# Patient Record
Sex: Male | Born: 1935 | Race: Black or African American | Hispanic: No | Marital: Single | Smoking: Former smoker
Health system: Southern US, Community
[De-identification: ages and names within clinical notes are randomized; demographics above are authoritative.]

## PROBLEM LIST (undated history)

## (undated) DIAGNOSIS — M1990 Unspecified osteoarthritis, unspecified site: Secondary | ICD-10-CM

## (undated) DIAGNOSIS — I10 Essential (primary) hypertension: Secondary | ICD-10-CM

## (undated) DIAGNOSIS — F0390 Unspecified dementia without behavioral disturbance: Secondary | ICD-10-CM

## (undated) DIAGNOSIS — C189 Malignant neoplasm of colon, unspecified: Secondary | ICD-10-CM

## (undated) DIAGNOSIS — R569 Unspecified convulsions: Secondary | ICD-10-CM

## (undated) DIAGNOSIS — D649 Anemia, unspecified: Secondary | ICD-10-CM

## (undated) DIAGNOSIS — F1020 Alcohol dependence, uncomplicated: Secondary | ICD-10-CM

## (undated) HISTORY — DX: Unspecified convulsions: R56.9

## (undated) HISTORY — DX: Unspecified osteoarthritis, unspecified site: M19.90

## (undated) HISTORY — DX: Alcohol dependence, uncomplicated: F10.20

## (undated) HISTORY — PX: COLONOSCOPY: SHX174

## (undated) HISTORY — PX: ORIF FEMORAL NECK FRACTURE W/ DHS: SUR930

## (undated) HISTORY — DX: Anemia, unspecified: D64.9

## (undated) HISTORY — DX: Essential (primary) hypertension: I10

## (undated) HISTORY — DX: Unspecified dementia, unspecified severity, without behavioral disturbance, psychotic disturbance, mood disturbance, and anxiety: F03.90

---

## 1998-07-06 ENCOUNTER — Emergency Department (HOSPITAL_COMMUNITY): Admission: EM | Admit: 1998-07-06 | Discharge: 1998-07-06 | Payer: Self-pay | Admitting: Emergency Medicine

## 1998-07-06 ENCOUNTER — Encounter: Payer: Self-pay | Admitting: Emergency Medicine

## 1999-09-11 ENCOUNTER — Emergency Department (HOSPITAL_COMMUNITY): Admission: EM | Admit: 1999-09-11 | Discharge: 1999-09-11 | Payer: Self-pay | Admitting: Emergency Medicine

## 1999-09-11 ENCOUNTER — Encounter: Payer: Self-pay | Admitting: Emergency Medicine

## 2000-09-17 ENCOUNTER — Emergency Department (HOSPITAL_COMMUNITY): Admission: EM | Admit: 2000-09-17 | Discharge: 2000-09-18 | Payer: Self-pay | Admitting: Emergency Medicine

## 2000-12-15 ENCOUNTER — Encounter: Payer: Self-pay | Admitting: Emergency Medicine

## 2000-12-15 ENCOUNTER — Emergency Department (HOSPITAL_COMMUNITY): Admission: EM | Admit: 2000-12-15 | Discharge: 2000-12-16 | Payer: Self-pay

## 2001-08-01 ENCOUNTER — Emergency Department (HOSPITAL_COMMUNITY): Admission: EM | Admit: 2001-08-01 | Discharge: 2001-08-01 | Payer: Self-pay | Admitting: Emergency Medicine

## 2001-08-01 ENCOUNTER — Encounter: Payer: Self-pay | Admitting: Emergency Medicine

## 2002-03-14 ENCOUNTER — Emergency Department (HOSPITAL_COMMUNITY): Admission: EM | Admit: 2002-03-14 | Discharge: 2002-03-14 | Payer: Self-pay | Admitting: *Deleted

## 2002-03-14 ENCOUNTER — Encounter: Payer: Self-pay | Admitting: *Deleted

## 2002-03-29 ENCOUNTER — Ambulatory Visit (HOSPITAL_COMMUNITY): Admission: RE | Admit: 2002-03-29 | Discharge: 2002-03-29 | Payer: Self-pay | Admitting: Internal Medicine

## 2002-03-29 ENCOUNTER — Encounter: Payer: Self-pay | Admitting: Internal Medicine

## 2002-04-09 ENCOUNTER — Encounter: Admission: RE | Admit: 2002-04-09 | Discharge: 2002-04-09 | Payer: Self-pay | Admitting: Internal Medicine

## 2002-04-09 ENCOUNTER — Encounter: Payer: Self-pay | Admitting: Internal Medicine

## 2002-04-12 ENCOUNTER — Ambulatory Visit (HOSPITAL_COMMUNITY): Admission: RE | Admit: 2002-04-12 | Discharge: 2002-04-12 | Payer: Self-pay | Admitting: Internal Medicine

## 2002-04-12 ENCOUNTER — Encounter: Payer: Self-pay | Admitting: Internal Medicine

## 2003-11-06 ENCOUNTER — Emergency Department (HOSPITAL_COMMUNITY): Admission: EM | Admit: 2003-11-06 | Discharge: 2003-11-06 | Payer: Self-pay | Admitting: Emergency Medicine

## 2004-10-20 ENCOUNTER — Inpatient Hospital Stay (HOSPITAL_COMMUNITY): Admission: EM | Admit: 2004-10-20 | Discharge: 2004-10-24 | Payer: Self-pay | Admitting: Emergency Medicine

## 2004-11-02 ENCOUNTER — Ambulatory Visit: Payer: Self-pay | Admitting: Nurse Practitioner

## 2004-11-06 ENCOUNTER — Emergency Department (HOSPITAL_COMMUNITY): Admission: EM | Admit: 2004-11-06 | Discharge: 2004-11-06 | Payer: Self-pay | Admitting: Emergency Medicine

## 2004-12-22 ENCOUNTER — Ambulatory Visit (HOSPITAL_COMMUNITY): Admission: RE | Admit: 2004-12-22 | Discharge: 2004-12-22 | Payer: Self-pay | Admitting: Internal Medicine

## 2005-04-03 ENCOUNTER — Emergency Department (HOSPITAL_COMMUNITY): Admission: EM | Admit: 2005-04-03 | Discharge: 2005-04-03 | Payer: Self-pay | Admitting: Emergency Medicine

## 2005-05-20 ENCOUNTER — Emergency Department (HOSPITAL_COMMUNITY): Admission: EM | Admit: 2005-05-20 | Discharge: 2005-05-20 | Payer: Self-pay | Admitting: Emergency Medicine

## 2005-08-16 ENCOUNTER — Emergency Department (HOSPITAL_COMMUNITY): Admission: EM | Admit: 2005-08-16 | Discharge: 2005-08-16 | Payer: Self-pay | Admitting: Emergency Medicine

## 2005-08-22 ENCOUNTER — Emergency Department (HOSPITAL_COMMUNITY): Admission: EM | Admit: 2005-08-22 | Discharge: 2005-08-22 | Payer: Self-pay | Admitting: Emergency Medicine

## 2005-09-14 ENCOUNTER — Emergency Department (HOSPITAL_COMMUNITY): Admission: EM | Admit: 2005-09-14 | Discharge: 2005-09-15 | Payer: Self-pay | Admitting: Emergency Medicine

## 2006-07-30 IMAGING — CR DG HIP (WITH OR WITHOUT PELVIS) 2-3V*L*
3 series · 3 of 3 positions shown · non-contrast
Comparison: none

CLINICAL DATA: Left hip pain, no injury

LEFT HIP - 2 VIEW:

[t pelvis a.p.]
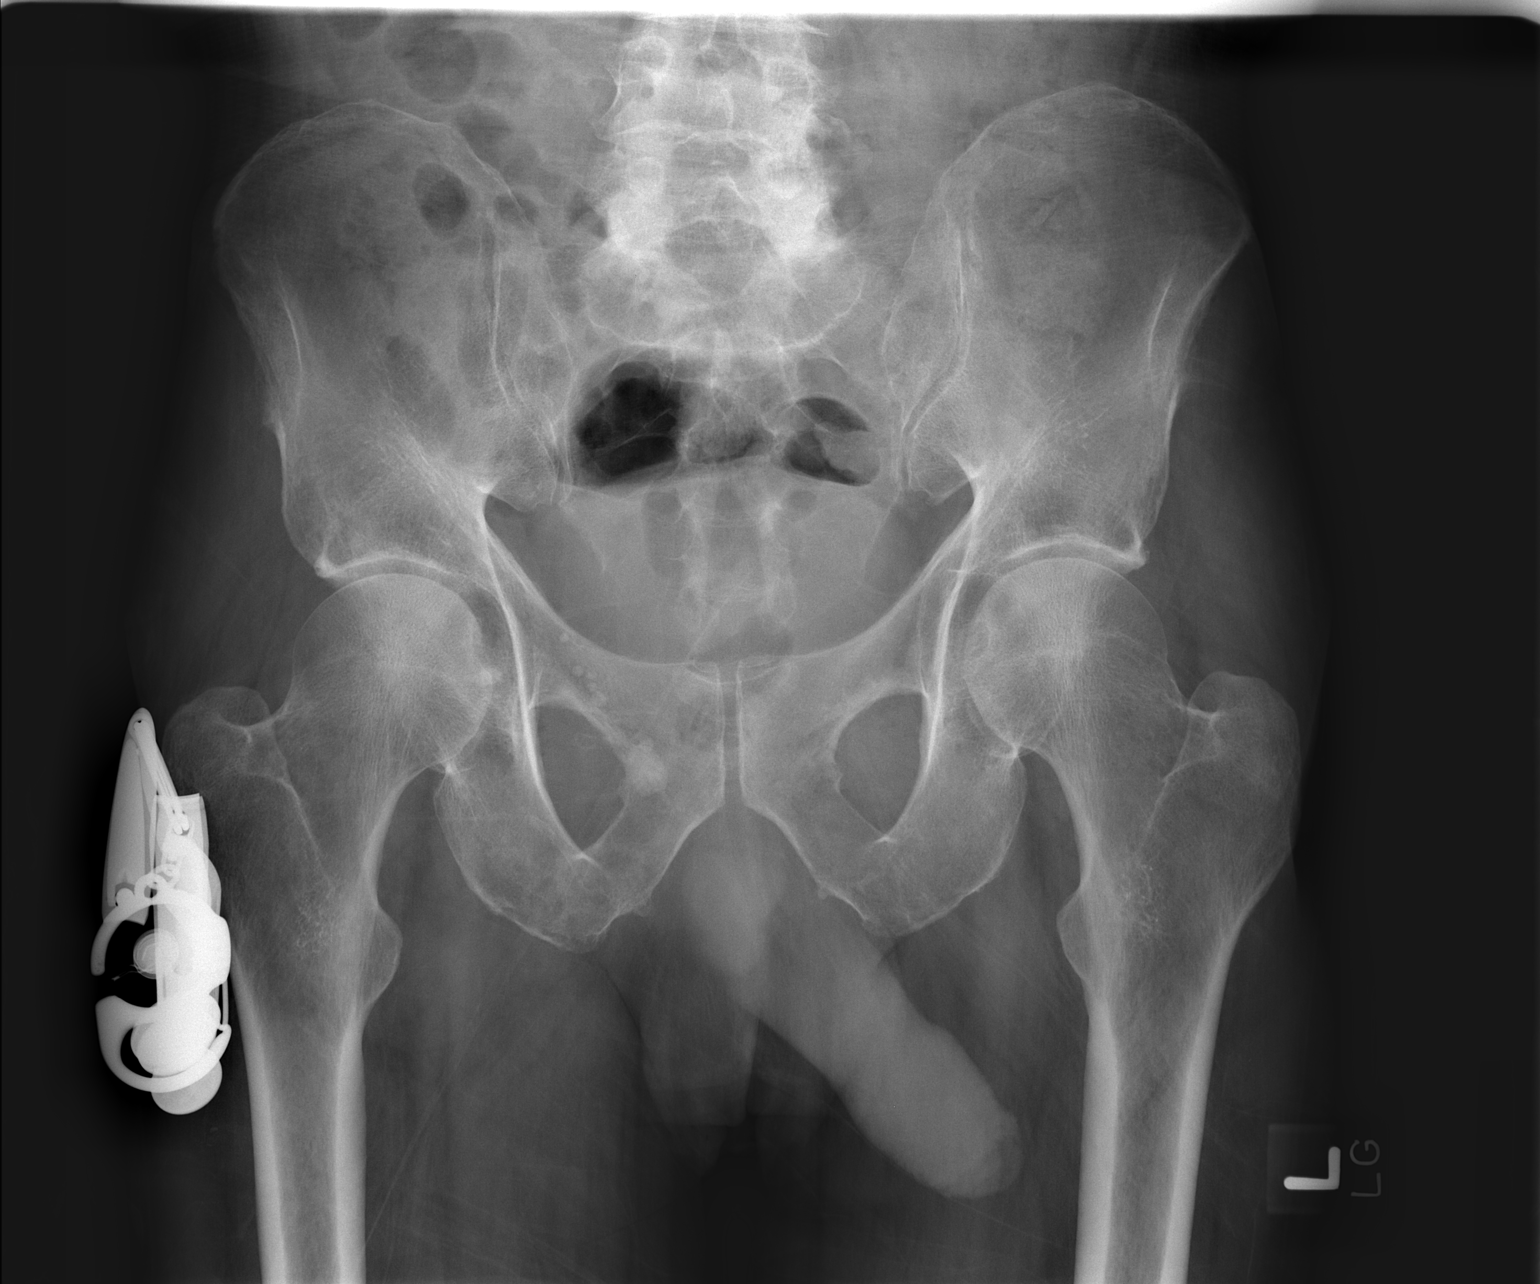

[t hip ap left]
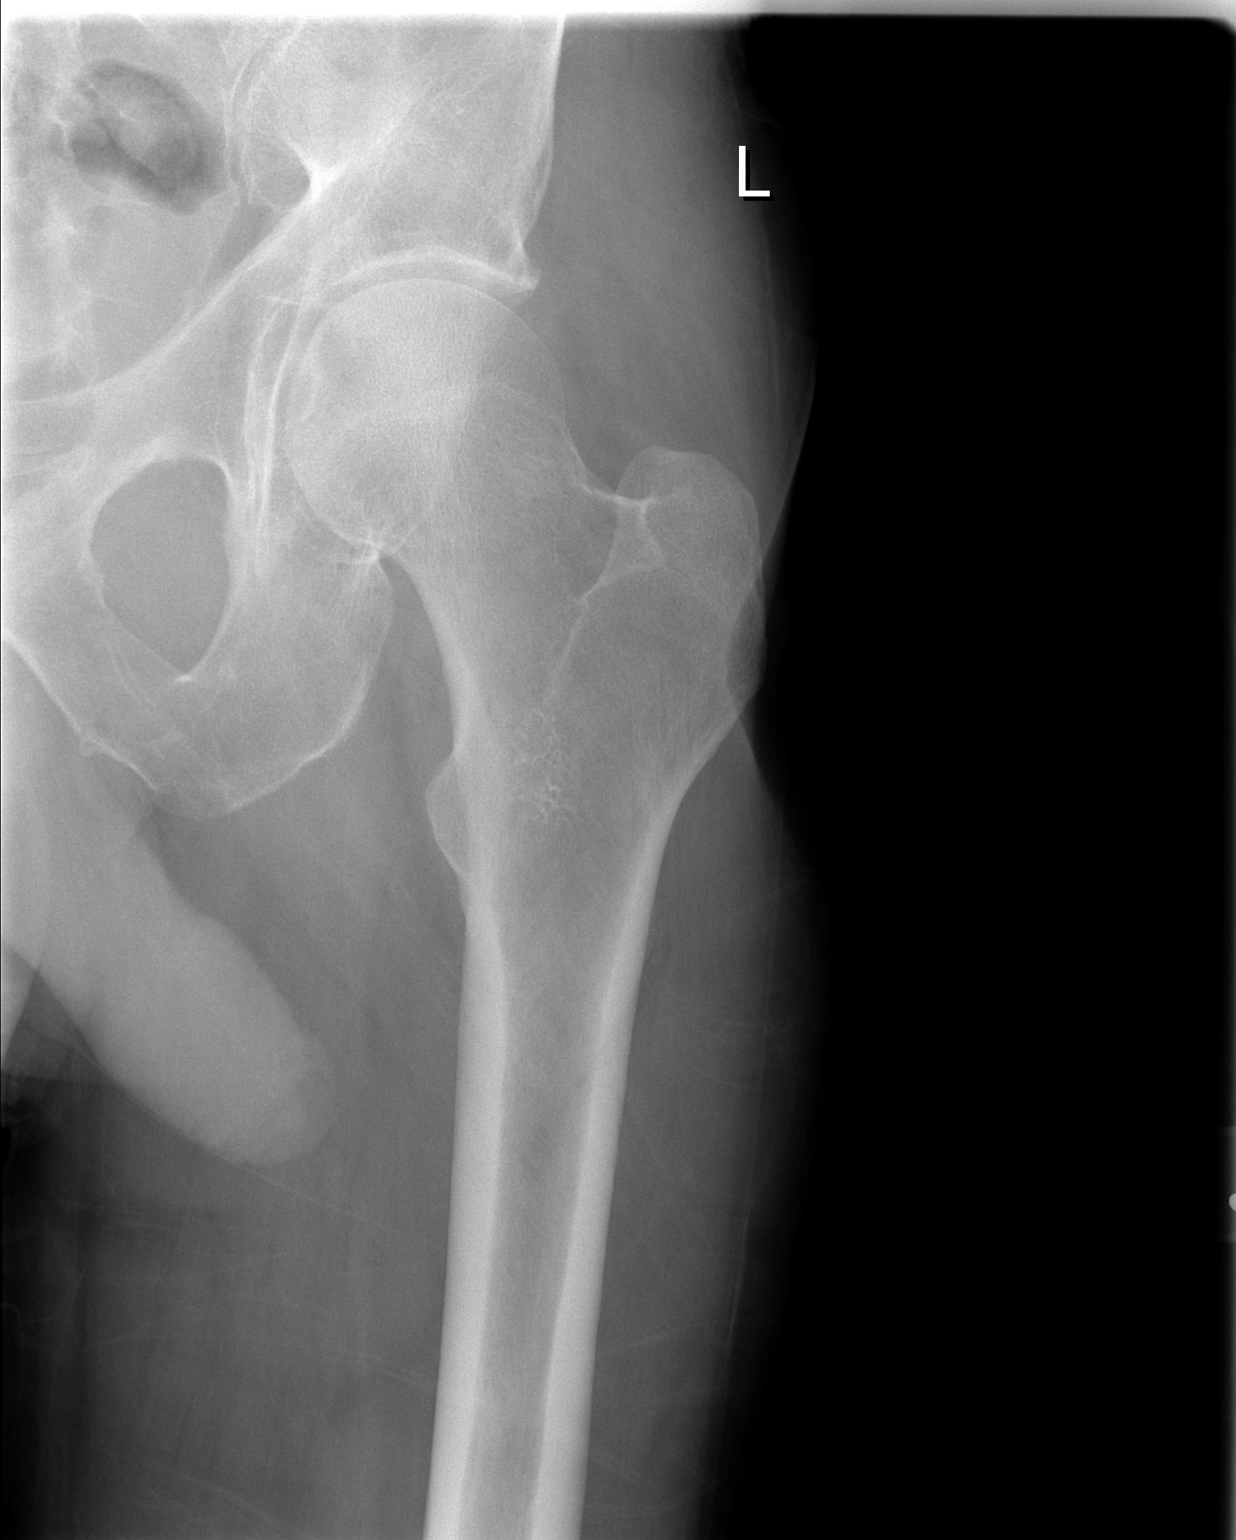

[t hip frog leg left]
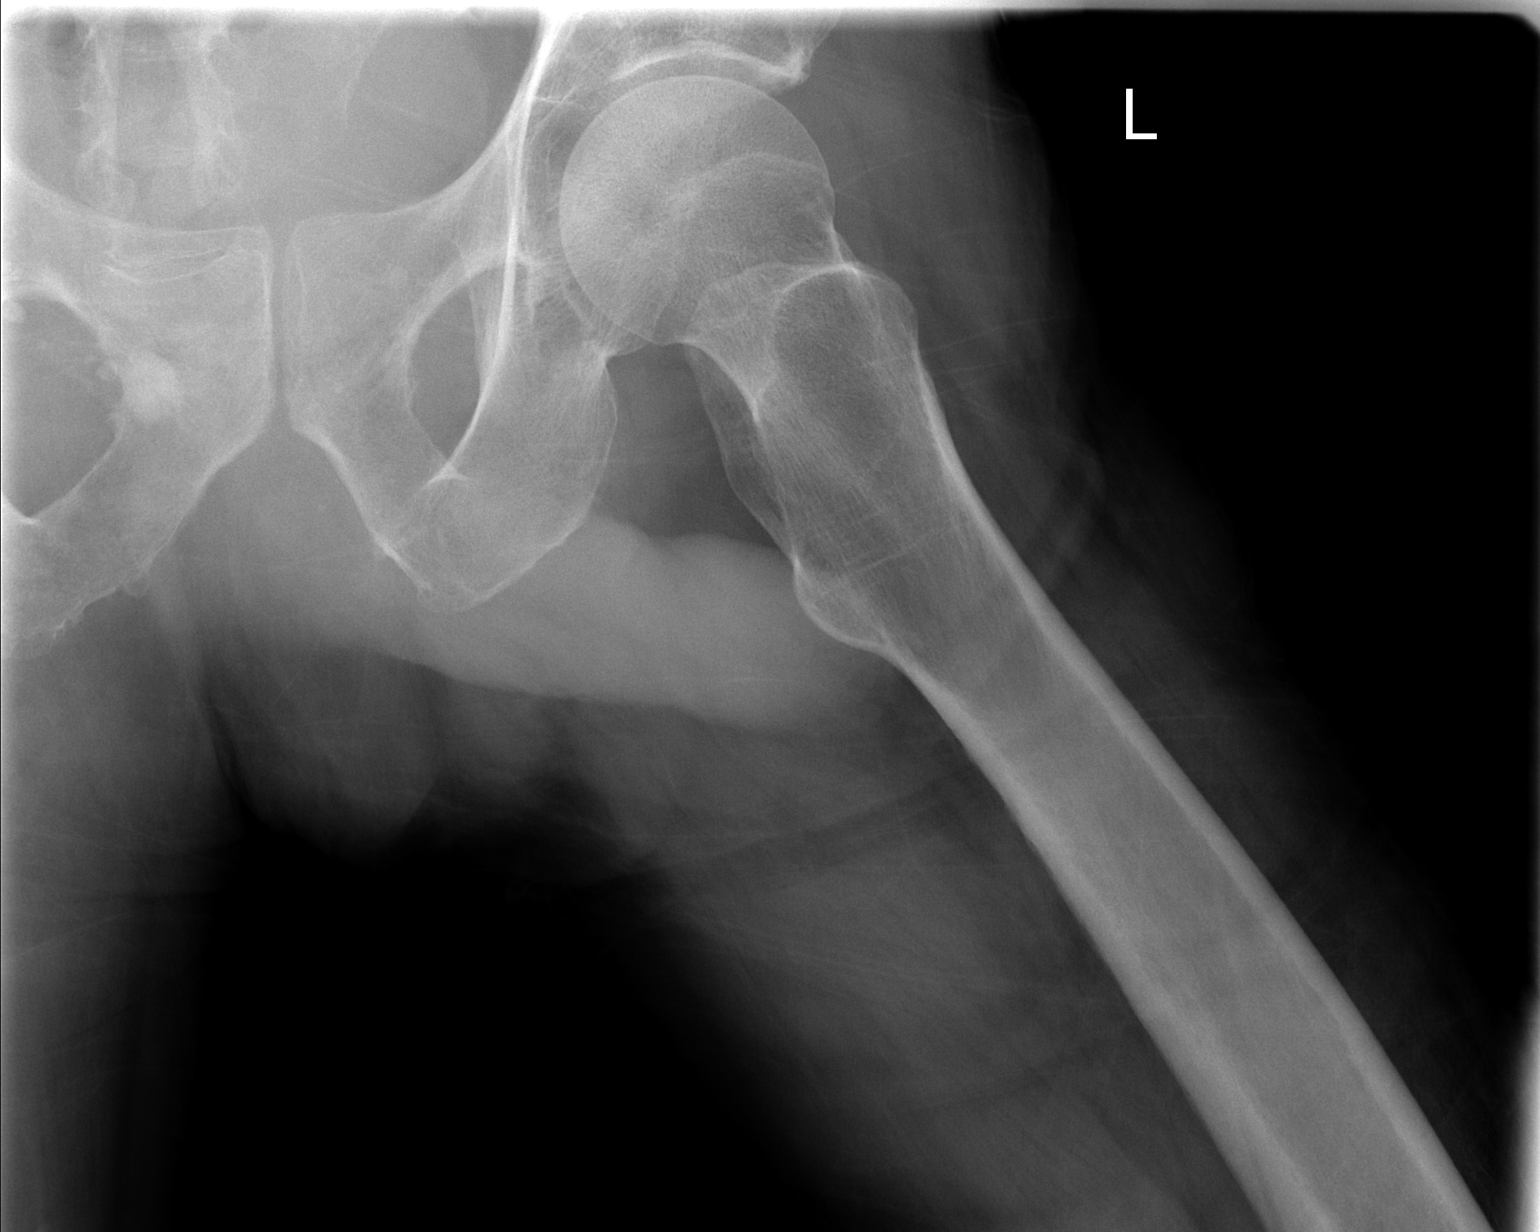

[3 of 3 positions shown; findings below may reference images not displayed]

FINDINGS: There are mild degenerative changes in the hips bilaterally. No
evidence of fracture, subluxation, or dislocation. Again noted is a sclerotic
focus within the right pubic bone, stable since prior study, likely bone island.
IMPRESSION: Mild degenerative changes. No acute findings.

## 2006-10-13 ENCOUNTER — Emergency Department (HOSPITAL_COMMUNITY): Admission: EM | Admit: 2006-10-13 | Discharge: 2006-10-13 | Payer: Self-pay | Admitting: Emergency Medicine

## 2007-04-06 ENCOUNTER — Emergency Department (HOSPITAL_COMMUNITY): Admission: EM | Admit: 2007-04-06 | Discharge: 2007-04-07 | Payer: Self-pay | Admitting: Emergency Medicine

## 2007-08-19 ENCOUNTER — Inpatient Hospital Stay (HOSPITAL_COMMUNITY): Admission: EM | Admit: 2007-08-19 | Discharge: 2007-08-23 | Payer: Self-pay | Admitting: Emergency Medicine

## 2007-08-20 ENCOUNTER — Encounter (INDEPENDENT_AMBULATORY_CARE_PROVIDER_SITE_OTHER): Payer: Self-pay | Admitting: Orthopedic Surgery

## 2007-09-05 ENCOUNTER — Ambulatory Visit (HOSPITAL_COMMUNITY): Admission: RE | Admit: 2007-09-05 | Discharge: 2007-09-05 | Payer: Self-pay | Admitting: Orthopedic Surgery

## 2007-09-05 ENCOUNTER — Ambulatory Visit: Payer: Self-pay | Admitting: Vascular Surgery

## 2007-09-05 ENCOUNTER — Encounter (INDEPENDENT_AMBULATORY_CARE_PROVIDER_SITE_OTHER): Payer: Self-pay | Admitting: Orthopedic Surgery

## 2008-07-23 ENCOUNTER — Inpatient Hospital Stay (HOSPITAL_COMMUNITY): Admission: EM | Admit: 2008-07-23 | Discharge: 2008-07-29 | Payer: Self-pay | Admitting: Emergency Medicine

## 2008-10-06 ENCOUNTER — Inpatient Hospital Stay (HOSPITAL_COMMUNITY): Admission: EM | Admit: 2008-10-06 | Discharge: 2008-10-13 | Payer: Self-pay | Admitting: Emergency Medicine

## 2008-12-25 ENCOUNTER — Emergency Department (HOSPITAL_COMMUNITY): Admission: EM | Admit: 2008-12-25 | Discharge: 2008-12-25 | Payer: Self-pay | Admitting: Emergency Medicine

## 2009-02-25 ENCOUNTER — Ambulatory Visit: Payer: Self-pay | Admitting: Internal Medicine

## 2009-02-25 ENCOUNTER — Inpatient Hospital Stay (HOSPITAL_COMMUNITY): Admission: EM | Admit: 2009-02-25 | Discharge: 2009-03-04 | Payer: Self-pay | Admitting: Emergency Medicine

## 2009-09-16 ENCOUNTER — Inpatient Hospital Stay (HOSPITAL_COMMUNITY): Admission: EM | Admit: 2009-09-16 | Discharge: 2009-09-22 | Payer: Self-pay | Admitting: Emergency Medicine

## 2010-05-05 ENCOUNTER — Ambulatory Visit: Payer: Self-pay | Admitting: Gastroenterology

## 2010-07-24 ENCOUNTER — Inpatient Hospital Stay (INDEPENDENT_AMBULATORY_CARE_PROVIDER_SITE_OTHER)
Admission: RE | Admit: 2010-07-24 | Discharge: 2010-07-24 | Disposition: A | Discharge status: Home / Self Care | Payer: Medicaid Other | Source: Ambulatory Visit | Attending: Emergency Medicine | Admitting: Emergency Medicine

## 2010-07-24 DIAGNOSIS — I10 Essential (primary) hypertension: Secondary | ICD-10-CM

## 2010-07-24 DIAGNOSIS — N289 Disorder of kidney and ureter, unspecified: Secondary | ICD-10-CM

## 2010-07-24 DIAGNOSIS — R630 Anorexia: Secondary | ICD-10-CM

## 2010-07-24 DIAGNOSIS — F0390 Unspecified dementia without behavioral disturbance: Secondary | ICD-10-CM

## 2010-07-24 LAB — POCT URINALYSIS DIPSTICK
Glucose, UA: NEGATIVE mg/dL
Protein, ur: 30 mg/dL — AB
Specific Gravity, Urine: 1.01 (ref 1.005–1.030)
Urobilinogen, UA: 4 mg/dL — ABNORMAL HIGH (ref 0.0–1.0)
pH: 5.5 (ref 5.0–8.0)

## 2010-07-24 LAB — POCT I-STAT, CHEM 8
BUN: 42 mg/dL — ABNORMAL HIGH (ref 6–23)
Calcium, Ion: 1.05 mmol/L — ABNORMAL LOW (ref 1.12–1.32)
Chloride: 102 mEq/L (ref 96–112)
Creatinine, Ser: 2.1 mg/dL — ABNORMAL HIGH (ref 0.4–1.5)
TCO2: 21 mmol/L (ref 0–100)

## 2010-08-02 LAB — BASIC METABOLIC PANEL
BUN: 12 mg/dL (ref 6–23)
BUN: 17 mg/dL (ref 6–23)
CO2: 22 mEq/L (ref 19–32)
CO2: 25 mEq/L (ref 19–32)
CO2: 27 mEq/L (ref 19–32)
Calcium: 8.5 mg/dL (ref 8.4–10.5)
Calcium: 8.6 mg/dL (ref 8.4–10.5)
Calcium: 8.7 mg/dL (ref 8.4–10.5)
Chloride: 105 mEq/L (ref 96–112)
Chloride: 108 mEq/L (ref 96–112)
Chloride: 111 mEq/L (ref 96–112)
Creatinine, Ser: 0.97 mg/dL (ref 0.4–1.5)
Creatinine, Ser: 1.03 mg/dL (ref 0.4–1.5)
Creatinine, Ser: 1.13 mg/dL (ref 0.4–1.5)
Creatinine, Ser: 1.17 mg/dL (ref 0.4–1.5)
GFR calc Af Amer: >60 mL/min (ref >60)
GFR calc Af Amer: >60 mL/min (ref >60)
GFR calc Af Amer: >60 mL/min (ref >60)
GFR calc non Af Amer: >60 mL/min (ref >60)
GFR calc non Af Amer: >60 mL/min (ref >60)
GFR calc non Af Amer: >60 mL/min (ref >60)
Glucose, Bld: 105 mg/dL — ABNORMAL HIGH (ref 70–99)
Glucose, Bld: 110 mg/dL — ABNORMAL HIGH (ref 70–99)
Potassium: 4.2 mEq/L (ref 3.5–5.1)
Sodium: 136 mEq/L (ref 135–145)
Sodium: 138 mEq/L (ref 135–145)
Sodium: 141 mEq/L (ref 135–145)

## 2010-08-02 LAB — CBC
HCT: 31 % — ABNORMAL LOW (ref 39.0–52.0)
HCT: 33 % — ABNORMAL LOW (ref 39.0–52.0)
HCT: 40.7 % (ref 39.0–52.0)
Hemoglobin: 10.8 g/dL — ABNORMAL LOW (ref 13.0–17.0)
Hemoglobin: 13.3 g/dL (ref 13.0–17.0)
MCHC: 32.4 g/dL (ref 30.0–36.0)
MCHC: 32.8 g/dL (ref 30.0–36.0)
MCHC: 33.5 g/dL (ref 30.0–36.0)
MCV: 94.1 fL (ref 78.0–100.0)
MCV: 94.3 fL (ref 78.0–100.0)
MCV: 95 fL (ref 78.0–100.0)
Platelets: 106 10*3/uL — ABNORMAL LOW (ref 150–400)
Platelets: 114 10*3/uL — ABNORMAL LOW (ref 150–400)
Platelets: 115 10*3/uL — ABNORMAL LOW (ref 150–400)
Platelets: 191 10*3/uL (ref 150–400)
RBC: 3.27 MIL/uL — ABNORMAL LOW (ref 4.22–5.81)
RBC: 3.56 MIL/uL — ABNORMAL LOW (ref 4.22–5.81)
RDW: 12.6 % (ref 11.5–15.5)
RDW: 12.7 % (ref 11.5–15.5)
WBC: 9.4 10*3/uL (ref 4.0–10.5)
WBC: 9.8 10*3/uL (ref 4.0–10.5)
WBC: 9.9 10*3/uL (ref 4.0–10.5)

## 2010-08-02 LAB — URINALYSIS, ROUTINE W REFLEX MICROSCOPIC
Bilirubin Urine: NEGATIVE
Ketones, ur: 15 mg/dL — AB
Nitrite: NEGATIVE
Protein, ur: 30 mg/dL — AB
Specific Gravity, Urine: 1.019 (ref 1.005–1.030)
Urobilinogen, UA: 0.2 mg/dL (ref 0.0–1.0)

## 2010-08-02 LAB — COMPREHENSIVE METABOLIC PANEL
ALT: 127 U/L — ABNORMAL HIGH (ref 0–53)
Alkaline Phosphatase: 115 U/L (ref 39–117)
BUN: 29 mg/dL — ABNORMAL HIGH (ref 6–23)
CO2: 22 mEq/L (ref 19–32)
GFR calc non Af Amer: 41 mL/min — ABNORMAL LOW (ref >60)
Glucose, Bld: 114 mg/dL — ABNORMAL HIGH (ref 70–99)
Potassium: 5 mEq/L (ref 3.5–5.1)
Total Protein: 8.1 g/dL (ref 6.0–8.3)

## 2010-08-02 LAB — MAGNESIUM
Magnesium: 2.1 mg/dL (ref 1.5–2.5)
Magnesium: 2.6 mg/dL — ABNORMAL HIGH (ref 1.5–2.5)

## 2010-08-02 LAB — RAPID URINE DRUG SCREEN, HOSP PERFORMED
Amphetamines: NOT DETECTED
Benzodiazepines: NOT DETECTED
Cocaine: NOT DETECTED
Opiates: NOT DETECTED
Tetrahydrocannabinol: NOT DETECTED

## 2010-08-02 LAB — PHENYTOIN LEVEL, TOTAL
Phenytoin Lvl: 13.9 ug/mL (ref 10.0–20.0)
Phenytoin Lvl: 6.1 ug/mL — ABNORMAL LOW (ref 10.0–20.0)

## 2010-08-02 LAB — DIFFERENTIAL
Basophils Absolute: 0 10*3/uL (ref 0.0–0.1)
Basophils Relative: 1 % (ref 0–1)
Eosinophils Absolute: 0.1 10*3/uL (ref 0.0–0.7)
Monocytes Relative: 5 % (ref 3–12)
Neutro Abs: 5.1 10*3/uL (ref 1.7–7.7)
Neutrophils Relative %: 52 % (ref 43–77)

## 2010-08-02 LAB — PHOSPHORUS: Phosphorus: 3.2 mg/dL (ref 2.3–4.6)

## 2010-08-02 LAB — T4, FREE: Free T4: 1.01 ng/dL (ref 0.80–1.80)

## 2010-08-02 LAB — FLUORESCENT TREPONEMAL AB(FTA)-IGG-BLD

## 2010-08-02 LAB — TSH: TSH: 0.745 u[IU]/mL (ref 0.350–4.500)

## 2010-08-02 LAB — AMMONIA: Ammonia: 34 umol/L (ref 11–35)

## 2010-08-18 LAB — URINALYSIS, ROUTINE W REFLEX MICROSCOPIC
Glucose, UA: NEGATIVE mg/dL
Ketones, ur: NEGATIVE mg/dL
Leukocytes, UA: NEGATIVE
Nitrite: NEGATIVE
Specific Gravity, Urine: 1.012 (ref 1.005–1.030)
Specific Gravity, Urine: 1.016 (ref 1.005–1.030)
Urobilinogen, UA: 0.2 mg/dL (ref 0.0–1.0)
pH: 6 (ref 5.0–8.0)

## 2010-08-18 LAB — GLUCOSE, CAPILLARY
Glucose-Capillary: 90 mg/dL (ref 70–99)
Glucose-Capillary: 91 mg/dL (ref 70–99)
Glucose-Capillary: 119 mg/dL — ABNORMAL HIGH (ref 70–99)
Glucose-Capillary: 127 mg/dL — ABNORMAL HIGH (ref 70–99)
Glucose-Capillary: 134 mg/dL — ABNORMAL HIGH (ref 70–99)
Glucose-Capillary: 148 mg/dL — ABNORMAL HIGH (ref 70–99)
Glucose-Capillary: 153 mg/dL — ABNORMAL HIGH (ref 70–99)
Glucose-Capillary: 87 mg/dL (ref 70–99)

## 2010-08-18 LAB — CBC
HCT: 29.8 % — ABNORMAL LOW (ref 39.0–52.0)
HCT: 30.8 % — ABNORMAL LOW (ref 39.0–52.0)
HCT: 38 % — ABNORMAL LOW (ref 39.0–52.0)
Hemoglobin: 10.6 g/dL — ABNORMAL LOW (ref 13.0–17.0)
Hemoglobin: 10.8 g/dL — ABNORMAL LOW (ref 13.0–17.0)
MCHC: 33 g/dL (ref 30.0–36.0)
MCV: 93.7 fL (ref 78.0–100.0)
MCV: 92.9 fL (ref 78.0–100.0)
Platelets: 142 10*3/uL — ABNORMAL LOW (ref 150–400)
Platelets: 168 10*3/uL (ref 150–400)
Platelets: 185 10*3/uL (ref 150–400)
RBC: 3.49 MIL/uL — ABNORMAL LOW (ref 4.22–5.81)
RBC: 3.55 MIL/uL — ABNORMAL LOW (ref 4.22–5.81)
RDW: 12.5 % (ref 11.5–15.5)
RDW: 12.5 % (ref 11.5–15.5)
WBC: 11.2 10*3/uL — ABNORMAL HIGH (ref 4.0–10.5)
WBC: 14.3 10*3/uL — ABNORMAL HIGH (ref 4.0–10.5)
WBC: 15.9 10*3/uL — ABNORMAL HIGH (ref 4.0–10.5)

## 2010-08-18 LAB — BASIC METABOLIC PANEL
BUN: 16 mg/dL (ref 6–23)
CO2: 26 mEq/L (ref 19–32)
CO2: 28 mEq/L (ref 19–32)
CO2: 28 mEq/L (ref 19–32)
CO2: 18 mEq/L — ABNORMAL LOW (ref 19–32)
CO2: 22 mEq/L (ref 19–32)
Calcium: 8.5 mg/dL (ref 8.4–10.5)
Calcium: 8.7 mg/dL (ref 8.4–10.5)
Calcium: 8.5 mg/dL (ref 8.4–10.5)
Chloride: 105 mEq/L (ref 96–112)
Chloride: 102 mEq/L (ref 96–112)
Chloride: 105 mEq/L (ref 96–112)
Creatinine, Ser: 1.04 mg/dL (ref 0.4–1.5)
GFR calc Af Amer: >60 mL/min (ref >60)
GFR calc Af Amer: >60 mL/min (ref >60)
GFR calc non Af Amer: >60 mL/min (ref >60)
Glucose, Bld: 100 mg/dL — ABNORMAL HIGH (ref 70–99)
Glucose, Bld: 96 mg/dL (ref 70–99)
Glucose, Bld: 133 mg/dL — ABNORMAL HIGH (ref 70–99)
Potassium: 3.6 mEq/L (ref 3.5–5.1)
Potassium: 3.2 mEq/L — ABNORMAL LOW (ref 3.5–5.1)
Potassium: 3.7 mEq/L (ref 3.5–5.1)
Sodium: 138 mEq/L (ref 135–145)
Sodium: 139 mEq/L (ref 135–145)
Sodium: 137 mEq/L (ref 135–145)

## 2010-08-18 LAB — BLOOD GAS, ARTERIAL
Acid-base deficit: 0.2 mmol/L (ref 0.0–2.0)
Drawn by: 295031
FIO2: 0.5 %
MECHVT: 500 mL
O2 Saturation: 99.5 %
RATE: 12 resp/min

## 2010-08-18 LAB — COMPREHENSIVE METABOLIC PANEL
ALT: 24 U/L (ref 0–53)
ALT: 24 U/L (ref 0–53)
AST: 44 U/L — ABNORMAL HIGH (ref 0–37)
Albumin: 2.6 g/dL — ABNORMAL LOW (ref 3.5–5.2)
Alkaline Phosphatase: 52 U/L (ref 39–117)
Alkaline Phosphatase: 56 U/L (ref 39–117)
CO2: 25 mEq/L (ref 19–32)
Chloride: 109 mEq/L (ref 96–112)
GFR calc Af Amer: >60 mL/min (ref >60)
GFR calc non Af Amer: 58 mL/min — ABNORMAL LOW (ref >60)
Glucose, Bld: 93 mg/dL (ref 70–99)
Potassium: 3.5 mEq/L (ref 3.5–5.1)
Potassium: 3.8 mEq/L (ref 3.5–5.1)
Sodium: 139 mEq/L (ref 135–145)
Sodium: 142 mEq/L (ref 135–145)
Total Bilirubin: 1.5 mg/dL — ABNORMAL HIGH (ref 0.3–1.2)
Total Protein: 6.1 g/dL (ref 6.0–8.3)

## 2010-08-18 LAB — HEPATIC FUNCTION PANEL
Alkaline Phosphatase: 86 U/L (ref 39–117)
Indirect Bilirubin: 0.5 mg/dL (ref 0.3–0.9)
Total Bilirubin: 0.8 mg/dL (ref 0.3–1.2)
Total Protein: 7.3 g/dL (ref 6.0–8.3)

## 2010-08-18 LAB — URINE CULTURE
Culture: NO GROWTH
Special Requests: NEGATIVE

## 2010-08-18 LAB — CARDIAC PANEL(CRET KIN+CKTOT+MB+TROPI)
CK, MB: 2.1 ng/mL (ref 0.3–4.0)
CK, MB: 2.8 ng/mL (ref 0.3–4.0)
CK, MB: 3.3 ng/mL (ref 0.3–4.0)
CK, MB: 3.6 ng/mL (ref 0.3–4.0)
Relative Index: 0.1 (ref 0.0–2.5)
Relative Index: 0.2 (ref 0.0–2.5)
Relative Index: 0.2 (ref 0.0–2.5)
Total CK: 2386 U/L — ABNORMAL HIGH (ref 7–232)

## 2010-08-18 LAB — LACTIC ACID, PLASMA: Lactic Acid, Venous: 8.5 mmol/L — ABNORMAL HIGH (ref 0.5–2.2)

## 2010-08-18 LAB — DIFFERENTIAL
Basophils Relative: 1 % (ref 0–1)
Eosinophils Absolute: 0.2 10*3/uL (ref 0.0–0.7)
Eosinophils Absolute: 0 10*3/uL (ref 0.0–0.7)
Eosinophils Relative: 0 % (ref 0–5)
Lymphs Abs: 3.1 10*3/uL (ref 0.7–4.0)
Monocytes Absolute: 1 10*3/uL (ref 0.1–1.0)
Monocytes Relative: 9 % (ref 3–12)
Neutrophils Relative %: 58 % (ref 43–77)

## 2010-08-18 LAB — URINE MICROSCOPIC-ADD ON

## 2010-08-18 LAB — TROPONIN I: Troponin I: 0.02 ng/mL (ref 0.00–0.06)

## 2010-08-18 LAB — MAGNESIUM
Magnesium: 1.8 mg/dL (ref 1.5–2.5)
Magnesium: 1.9 mg/dL (ref 1.5–2.5)

## 2010-08-18 LAB — BRAIN NATRIURETIC PEPTIDE: Pro B Natriuretic peptide (BNP): 462 pg/mL — ABNORMAL HIGH (ref 0.0–100.0)

## 2010-08-18 LAB — STREP PNEUMONIAE URINARY ANTIGEN: Strep Pneumo Urinary Antigen: NEGATIVE

## 2010-08-18 LAB — APTT: aPTT: 29 seconds (ref 24–37)

## 2010-08-18 LAB — LEGIONELLA ANTIGEN, URINE: Legionella Antigen, Urine: NEGATIVE

## 2010-08-18 LAB — CULTURE, BLOOD (ROUTINE X 2)

## 2010-08-18 LAB — CK TOTAL AND CKMB (NOT AT ARMC): Relative Index: 1 (ref 0.0–2.5)

## 2010-08-18 LAB — PROTIME-INR: INR: 1.13 (ref 0.00–1.49)

## 2010-08-20 LAB — URINE MICROSCOPIC-ADD ON

## 2010-08-20 LAB — URINALYSIS, ROUTINE W REFLEX MICROSCOPIC
Bilirubin Urine: NEGATIVE
Protein, ur: 100 mg/dL — AB
Urobilinogen, UA: 0.2 mg/dL (ref 0.0–1.0)

## 2010-08-20 LAB — DIFFERENTIAL
Basophils Relative: 0 % (ref 0–1)
Eosinophils Absolute: 0 10*3/uL (ref 0.0–0.7)
Eosinophils Absolute: 0 10*3/uL (ref 0.0–0.7)
Eosinophils Relative: 0 % (ref 0–5)
Eosinophils Relative: 0 % (ref 0–5)
Lymphocytes Relative: 16 % (ref 12–46)
Lymphs Abs: 2.3 10*3/uL (ref 0.7–4.0)
Monocytes Absolute: 0.4 10*3/uL (ref 0.1–1.0)
Monocytes Absolute: 0.5 10*3/uL (ref 0.1–1.0)
Monocytes Relative: 3 % (ref 3–12)
Monocytes Relative: 3 % (ref 3–12)

## 2010-08-20 LAB — COMPREHENSIVE METABOLIC PANEL
ALT: 40 U/L (ref 0–53)
AST: 40 U/L — ABNORMAL HIGH (ref 0–37)
Albumin: 3.7 g/dL (ref 3.5–5.2)
CO2: 27 mEq/L (ref 19–32)
Calcium: 9.7 mg/dL (ref 8.4–10.5)
Creatinine, Ser: 1.29 mg/dL (ref 0.4–1.5)
GFR calc Af Amer: >60 mL/min (ref >60)
GFR calc non Af Amer: 55 mL/min — ABNORMAL LOW (ref >60)
Sodium: 136 mEq/L (ref 135–145)
Total Protein: 8.3 g/dL (ref 6.0–8.3)

## 2010-08-20 LAB — CBC
HCT: 40 % (ref 39.0–52.0)
Hemoglobin: 13.2 g/dL (ref 13.0–17.0)
MCHC: 32.9 g/dL (ref 30.0–36.0)
MCHC: 33.1 g/dL (ref 30.0–36.0)
MCV: 91.2 fL (ref 78.0–100.0)
MCV: 91.8 fL (ref 78.0–100.0)
Platelets: 168 10*3/uL (ref 150–400)
RBC: 4.22 MIL/uL (ref 4.22–5.81)
RBC: 4.35 MIL/uL (ref 4.22–5.81)

## 2010-08-23 LAB — COMPREHENSIVE METABOLIC PANEL
ALT: 46 U/L (ref 0–53)
ALT: 67 U/L — ABNORMAL HIGH (ref 0–53)
AST: 53 U/L — ABNORMAL HIGH (ref 0–37)
AST: 87 U/L — ABNORMAL HIGH (ref 0–37)
Albumin: 3.3 g/dL — ABNORMAL LOW (ref 3.5–5.2)
CO2: 21 mEq/L (ref 19–32)
Chloride: 106 mEq/L (ref 96–112)
Chloride: 113 mEq/L — ABNORMAL HIGH (ref 96–112)
Creatinine, Ser: 1.13 mg/dL (ref 0.4–1.5)
Creatinine, Ser: 1.47 mg/dL (ref 0.4–1.5)
GFR calc Af Amer: 57 mL/min — ABNORMAL LOW (ref >60)
GFR calc Af Amer: >60 mL/min (ref >60)
GFR calc non Af Amer: >60 mL/min (ref >60)
Potassium: 3.8 mEq/L (ref 3.5–5.1)
Sodium: 141 mEq/L (ref 135–145)
Sodium: 146 mEq/L — ABNORMAL HIGH (ref 135–145)
Total Bilirubin: 1 mg/dL (ref 0.3–1.2)
Total Bilirubin: 1.5 mg/dL — ABNORMAL HIGH (ref 0.3–1.2)

## 2010-08-23 LAB — RAPID URINE DRUG SCREEN, HOSP PERFORMED
Amphetamines: NOT DETECTED
Barbiturates: NOT DETECTED
Benzodiazepines: NOT DETECTED
Cocaine: NOT DETECTED

## 2010-08-23 LAB — URINE MICROSCOPIC-ADD ON

## 2010-08-23 LAB — HEPATIC FUNCTION PANEL
ALT: 29 U/L (ref 0–53)
ALT: 40 U/L (ref 0–53)
AST: 32 U/L (ref 0–37)
Alkaline Phosphatase: 76 U/L (ref 39–117)
Bilirubin, Direct: 0.2 mg/dL (ref 0.0–0.3)
Bilirubin, Direct: 0.4 mg/dL — ABNORMAL HIGH (ref 0.0–0.3)
Indirect Bilirubin: 0.8 mg/dL (ref 0.3–0.9)
Indirect Bilirubin: 1 mg/dL — ABNORMAL HIGH (ref 0.3–0.9)
Total Protein: 6.7 g/dL (ref 6.0–8.3)

## 2010-08-23 LAB — CULTURE, BLOOD (ROUTINE X 2): Culture: NO GROWTH

## 2010-08-23 LAB — BASIC METABOLIC PANEL
BUN: 27 mg/dL — ABNORMAL HIGH (ref 6–23)
CO2: 15 mEq/L — ABNORMAL LOW (ref 19–32)
CO2: 22 mEq/L (ref 19–32)
Calcium: 8.6 mg/dL (ref 8.4–10.5)
Chloride: 103 mEq/L (ref 96–112)
Chloride: 106 mEq/L (ref 96–112)
Creatinine, Ser: 1.62 mg/dL — ABNORMAL HIGH (ref 0.4–1.5)
GFR calc Af Amer: >60 mL/min (ref >60)
GFR calc Af Amer: >60 mL/min (ref >60)
GFR calc non Af Amer: >60 mL/min (ref >60)
GFR calc non Af Amer: >60 mL/min (ref >60)
Potassium: 3 mEq/L — ABNORMAL LOW (ref 3.5–5.1)
Potassium: 5.6 mEq/L — ABNORMAL HIGH (ref 3.5–5.1)
Sodium: 139 mEq/L (ref 135–145)
Sodium: 144 mEq/L (ref 135–145)

## 2010-08-23 LAB — URINALYSIS, ROUTINE W REFLEX MICROSCOPIC
Glucose, UA: NEGATIVE mg/dL
Ketones, ur: 15 mg/dL — AB
Protein, ur: 100 mg/dL — AB
Urobilinogen, UA: 1 mg/dL (ref 0.0–1.0)

## 2010-08-23 LAB — CBC
HCT: 30.9 % — ABNORMAL LOW (ref 39.0–52.0)
HCT: 32.5 % — ABNORMAL LOW (ref 39.0–52.0)
HCT: 42.1 % (ref 39.0–52.0)
Hemoglobin: 10.2 g/dL — ABNORMAL LOW (ref 13.0–17.0)
Hemoglobin: 10.8 g/dL — ABNORMAL LOW (ref 13.0–17.0)
MCHC: 33.7 g/dL (ref 30.0–36.0)
MCHC: 33.8 g/dL (ref 30.0–36.0)
MCV: 89.3 fL (ref 78.0–100.0)
MCV: 89.5 fL (ref 78.0–100.0)
MCV: 91 fL (ref 78.0–100.0)
MCV: 91.2 fL (ref 78.0–100.0)
Platelets: 109 10*3/uL — ABNORMAL LOW (ref 150–400)
Platelets: 109 10*3/uL — ABNORMAL LOW (ref 150–400)
Platelets: 130 10*3/uL — ABNORMAL LOW (ref 150–400)
Platelets: 73 10*3/uL — ABNORMAL LOW (ref 150–400)
RBC: 3.34 MIL/uL — ABNORMAL LOW (ref 4.22–5.81)
RBC: 3.56 MIL/uL — ABNORMAL LOW (ref 4.22–5.81)
RBC: 3.94 MIL/uL — ABNORMAL LOW (ref 4.22–5.81)
RDW: 12.4 % (ref 11.5–15.5)
WBC: 10.5 10*3/uL (ref 4.0–10.5)
WBC: 11.6 10*3/uL — ABNORMAL HIGH (ref 4.0–10.5)
WBC: 8 10*3/uL (ref 4.0–10.5)
WBC: 8.5 10*3/uL (ref 4.0–10.5)
WBC: 8.6 10*3/uL (ref 4.0–10.5)

## 2010-08-23 LAB — ETHANOL: Alcohol, Ethyl (B): <5 mg/dL (ref 0–10)

## 2010-08-23 LAB — DIFFERENTIAL
Basophils Relative: 0 % (ref 0–1)
Eosinophils Absolute: 0 10*3/uL (ref 0.0–0.7)
Eosinophils Relative: 0 % (ref 0–5)
Lymphs Abs: 0.8 10*3/uL (ref 0.7–4.0)
Monocytes Relative: 2 % — ABNORMAL LOW (ref 3–12)
Neutrophils Relative %: 91 % — ABNORMAL HIGH (ref 43–77)

## 2010-08-23 LAB — APTT: aPTT: 35 seconds (ref 24–37)

## 2010-08-23 LAB — AMMONIA: Ammonia: 38 umol/L — ABNORMAL HIGH (ref 11–35)

## 2010-08-25 LAB — CULTURE, BLOOD (ROUTINE X 2)

## 2010-08-25 LAB — HEMOGLOBIN A1C
Hgb A1c MFr Bld: 5.1 % (ref 4.6–6.1)
Mean Plasma Glucose: 100 mg/dL

## 2010-08-25 LAB — COMPREHENSIVE METABOLIC PANEL
ALT: 123 U/L — ABNORMAL HIGH (ref 0–53)
ALT: 56 U/L — ABNORMAL HIGH (ref 0–53)
AST: 44 U/L — ABNORMAL HIGH (ref 0–37)
AST: 193 U/L — ABNORMAL HIGH (ref 0–37)
AST: 61 U/L — ABNORMAL HIGH (ref 0–37)
AST: 84 U/L — ABNORMAL HIGH (ref 0–37)
Alkaline Phosphatase: 112 U/L (ref 39–117)
Alkaline Phosphatase: 98 U/L (ref 39–117)
CO2: 22 mEq/L (ref 19–32)
CO2: 16 mEq/L — ABNORMAL LOW (ref 19–32)
CO2: 18 mEq/L — ABNORMAL LOW (ref 19–32)
CO2: 20 mEq/L (ref 19–32)
Calcium: 7.3 mg/dL — ABNORMAL LOW (ref 8.4–10.5)
Calcium: 7.5 mg/dL — ABNORMAL LOW (ref 8.4–10.5)
Chloride: 107 mEq/L (ref 96–112)
Chloride: 112 mEq/L (ref 96–112)
Creatinine, Ser: 1.13 mg/dL (ref 0.4–1.5)
Creatinine, Ser: 1.29 mg/dL (ref 0.4–1.5)
GFR calc Af Amer: >60 mL/min (ref >60)
GFR calc Af Amer: 35 mL/min — ABNORMAL LOW (ref >60)
GFR calc Af Amer: >60 mL/min (ref >60)
GFR calc Af Amer: >60 mL/min (ref >60)
GFR calc non Af Amer: >60 mL/min (ref >60)
GFR calc non Af Amer: 29 mL/min — ABNORMAL LOW (ref >60)
GFR calc non Af Amer: 55 mL/min — ABNORMAL LOW (ref >60)
GFR calc non Af Amer: >60 mL/min (ref >60)
Glucose, Bld: 114 mg/dL — ABNORMAL HIGH (ref 70–99)
Sodium: 133 mEq/L — ABNORMAL LOW (ref 135–145)
Sodium: 131 mEq/L — ABNORMAL LOW (ref 135–145)
Sodium: 136 mEq/L (ref 135–145)
Sodium: 137 mEq/L (ref 135–145)
Total Bilirubin: 1.4 mg/dL — ABNORMAL HIGH (ref 0.3–1.2)
Total Bilirubin: 3.2 mg/dL — ABNORMAL HIGH (ref 0.3–1.2)
Total Protein: 5.3 g/dL — ABNORMAL LOW (ref 6.0–8.3)
Total Protein: 5.4 g/dL — ABNORMAL LOW (ref 6.0–8.3)

## 2010-08-25 LAB — PSA: PSA: 2.37 ng/mL (ref 0.10–4.00)

## 2010-08-25 LAB — BASIC METABOLIC PANEL
BUN: 12 mg/dL (ref 6–23)
BUN: 34 mg/dL — ABNORMAL HIGH (ref 6–23)
CO2: 18 mEq/L — ABNORMAL LOW (ref 19–32)
CO2: 22 mEq/L (ref 19–32)
Calcium: 7.6 mg/dL — ABNORMAL LOW (ref 8.4–10.5)
Chloride: 103 mEq/L (ref 96–112)
Chloride: 106 mEq/L (ref 96–112)
Chloride: 112 mEq/L (ref 96–112)
Creatinine, Ser: 1.17 mg/dL (ref 0.4–1.5)
Creatinine, Ser: 1.54 mg/dL — ABNORMAL HIGH (ref 0.4–1.5)
GFR calc Af Amer: 54 mL/min — ABNORMAL LOW (ref >60)
GFR calc non Af Amer: >60 mL/min (ref >60)
Glucose, Bld: 120 mg/dL — ABNORMAL HIGH (ref 70–99)
Potassium: 3.6 mEq/L (ref 3.5–5.1)
Sodium: 133 mEq/L — ABNORMAL LOW (ref 135–145)

## 2010-08-25 LAB — CK TOTAL AND CKMB (NOT AT ARMC)
CK, MB: 3.7 ng/mL (ref 0.3–4.0)
CK, MB: 5 ng/mL — ABNORMAL HIGH (ref 0.3–4.0)
Relative Index: 0.7 (ref 0.0–2.5)
Total CK: 2845 U/L — ABNORMAL HIGH (ref 7–232)
Total CK: 559 U/L — ABNORMAL HIGH (ref 7–232)
Total CK: 873 U/L — ABNORMAL HIGH (ref 7–232)

## 2010-08-25 LAB — CBC
HCT: 22.3 % — ABNORMAL LOW (ref 39.0–52.0)
HCT: 32.1 % — ABNORMAL LOW (ref 39.0–52.0)
HCT: 35.4 % — ABNORMAL LOW (ref 39.0–52.0)
Hemoglobin: 10.4 g/dL — ABNORMAL LOW (ref 13.0–17.0)
Hemoglobin: 11.8 g/dL — ABNORMAL LOW (ref 13.0–17.0)
MCHC: 33.3 g/dL (ref 30.0–36.0)
MCHC: 33.3 g/dL (ref 30.0–36.0)
MCHC: 33.4 g/dL (ref 30.0–36.0)
MCHC: 33.4 g/dL (ref 30.0–36.0)
MCHC: 34.6 g/dL (ref 30.0–36.0)
MCV: 92.3 fL (ref 78.0–100.0)
MCV: 94.3 fL (ref 78.0–100.0)
MCV: 94.7 fL (ref 78.0–100.0)
MCV: 95.2 fL (ref 78.0–100.0)
MCV: 95.6 fL (ref 78.0–100.0)
Platelets: 91 10*3/uL — ABNORMAL LOW (ref 150–400)
Platelets: 52 10*3/uL — ABNORMAL LOW (ref 150–400)
Platelets: 57 10*3/uL — ABNORMAL LOW (ref 150–400)
Platelets: 62 10*3/uL — ABNORMAL LOW (ref 150–400)
Platelets: 64 10*3/uL — ABNORMAL LOW (ref 150–400)
Platelets: 66 10*3/uL — ABNORMAL LOW (ref 150–400)
RBC: 2.33 MIL/uL — ABNORMAL LOW (ref 4.22–5.81)
RBC: 2.62 MIL/uL — ABNORMAL LOW (ref 4.22–5.81)
RBC: 2.78 MIL/uL — ABNORMAL LOW (ref 4.22–5.81)
RBC: 3.74 MIL/uL — ABNORMAL LOW (ref 4.22–5.81)
RBC: 3.87 MIL/uL — ABNORMAL LOW (ref 4.22–5.81)
RDW: 14.8 % (ref 11.5–15.5)
WBC: 10.4 10*3/uL (ref 4.0–10.5)
WBC: 12.6 10*3/uL — ABNORMAL HIGH (ref 4.0–10.5)
WBC: 8.5 10*3/uL (ref 4.0–10.5)

## 2010-08-25 LAB — MAGNESIUM
Magnesium: 1.3 mg/dL — ABNORMAL LOW (ref 1.5–2.5)
Magnesium: 1.1 mg/dL — ABNORMAL LOW (ref 1.5–2.5)

## 2010-08-25 LAB — IRON AND TIBC
Saturation Ratios: 34 % (ref 20–55)
UIBC: 112 ug/dL

## 2010-08-25 LAB — HAPTOGLOBIN: Haptoglobin: 63 mg/dL (ref 16–200)

## 2010-08-25 LAB — TYPE AND SCREEN
ABO/RH(D): O POS
Antibody Screen: NEGATIVE

## 2010-08-25 LAB — URINALYSIS, ROUTINE W REFLEX MICROSCOPIC
Glucose, UA: NEGATIVE mg/dL
Nitrite: NEGATIVE
Specific Gravity, Urine: 1.019 (ref 1.005–1.030)
pH: 5.5 (ref 5.0–8.0)

## 2010-08-25 LAB — URINE MICROSCOPIC-ADD ON

## 2010-08-25 LAB — POCT I-STAT, CHEM 8
Creatinine, Ser: 2.8 mg/dL — ABNORMAL HIGH (ref 0.4–1.5)
Hemoglobin: 13.3 g/dL (ref 13.0–17.0)
Sodium: 138 mEq/L (ref 135–145)
TCO2: 20 mmol/L (ref 0–100)

## 2010-08-25 LAB — DIFFERENTIAL
Basophils Absolute: 0 10*3/uL (ref 0.0–0.1)
Basophils Relative: 0 % (ref 0–1)
Eosinophils Absolute: 0 10*3/uL (ref 0.0–0.7)
Eosinophils Relative: 0 % (ref 0–5)
Lymphocytes Relative: 14 % (ref 12–46)
Lymphs Abs: 2.5 10*3/uL (ref 0.7–4.0)
Lymphs Abs: 2.7 10*3/uL (ref 0.7–4.0)
Monocytes Absolute: 0.4 10*3/uL (ref 0.1–1.0)
Monocytes Relative: 2 % — ABNORMAL LOW (ref 3–12)
Neutro Abs: 15.8 10*3/uL — ABNORMAL HIGH (ref 1.7–7.7)
Neutrophils Relative %: 82 % — ABNORMAL HIGH (ref 43–77)

## 2010-08-25 LAB — LIPID PANEL
Cholesterol: 98 mg/dL (ref 0–200)
HDL: 21 mg/dL — ABNORMAL LOW (ref >39)
LDL Cholesterol: 52 mg/dL (ref 0–99)
Total CHOL/HDL Ratio: 4.7 RATIO

## 2010-08-25 LAB — FERRITIN: Ferritin: 1378 ng/mL — ABNORMAL HIGH (ref 22–322)

## 2010-08-25 LAB — SEDIMENTATION RATE: Sed Rate: 14 mm/hr (ref 0–16)

## 2010-08-25 LAB — HEMOCCULT GUIAC POC 1CARD (OFFICE): Fecal Occult Bld: NEGATIVE

## 2010-08-25 LAB — TSH: TSH: 1.412 u[IU]/mL (ref 0.350–4.500)

## 2010-08-25 LAB — URINE CULTURE

## 2010-08-25 LAB — VITAMIN B12: Vitamin B-12: 471 pg/mL (ref 211–911)

## 2010-09-27 NOTE — H&P (Signed)
NAMEHERSCHEL, FLEAGLE                  ACCOUNT NO.:  [id number]   MEDICAL RECORD NO.:  [id number]          PATIENT TYPE:  INP   LOCATION:  3308                         FACILITY:  [facility]   PHYSICIAN:  Darryl D. Prime, MD    DATE OF BIRTH:  1934/11/30   DATE OF ADMISSION:  10/05/2008  DATE OF DISCHARGE:                              HISTORY & PHYSICAL   PRIMARY CARE PHYSICIAN:  He denies having any.  He has been [employer] in the past.   CHIEF COMPLAINT:  None.  The patient's family brought him in because of  confusion, possible seizure.   HISTORY OF PRESENT ILLNESS:  Mr. Caraway is a 76 year old male with a  history of alcoholism, history of chronic liver disease, history of  anemia not otherwise specified, history of peripheral neuropathy who  apparently was at home and fell.  The family heard him hit the floor and  when they saw him, he was foaming at the mouth and incontinent of stool.  He was unresponsive except to pain when EMS saw him.  They brought him  in with a concern of alcohol withdrawal seizures.  In the emergency  room, he was given IV fluids, thiamine, Kayexalate and Ativan was  ordered.  The patient, at the time of the interview, was awake and alert  and oriented x2.  He apparently may have hit his head.  He denies any  chest pain, shortness of breath, fever, cough.  He denies any dizziness.  He denies any focal weakness.   PAST MEDICAL HISTORY:  As above.  1. History of generalized joint disease.  2. History of bradycardia.   PAST SURGICAL HISTORY:  History of right hip arthroplasty in 2009.   CURRENT MEDICATIONS:  None.   ALLERGIES:  No known drug allergies.   FAMILY HISTORY:  The patient denies any premature coronary artery  disease in his family.   SOCIAL HISTORY:  History of alcohol abuse as above, apparently  longstanding, usually in the form of beers.  Apparently the patient has  binge episodes.  He is divorced and retired.  He apparently lives  alone.   REVIEW OF SYSTEMS:  A 14- point review of systems negative unless stated  above.  His last alcohol ingestion was apparently around 24 hours ago.  He denies any diarrhea.  Denies any nausea, vomiting;  no black stools  or bloody stools.   PHYSICAL EXAMINATION:  VITAL SIGNS: Temperature is 97.6 with a blood  pressure 147/89, pulse of 116, respiratory rate of 16, saturations 99%  on room air.  GENERAL:  The patient is a chronically ill-appearing male lying in the  fetal position on his right side in no acute distress.  HEENT:  Normocephalic, atraumatic.  Pupils are equal, round and reactive to  light.  Conjunctivae injected.  The patient's oropharynx shows no  posterior pharyngeal lesions.  NECK:  Supple with no lymphadenopathy, no carotid bruits.  No jugular  venous distention.  LUNGS: Clear to auscultation bilaterally.  CARDIOVASCULAR EXAM:  Regular rhythm with a fast rate.  No murmurs.  ABDOMEN:  Soft, nontender, nondistended with normoactive bowel sounds.  No apparent hepatosplenomegaly.  EXTREMITIES:  Show no clubbing, cyanosis or edema. The patient has a  surgical scar over the right hip. There is no edema of no joint  effusions.  He has full range of motion of all joints.  NEUROLOGIC EXAM:  Cranial nerves II-XII grossly intact.  He moves all  extremities well.  PSYCHIATRIC EXAM:  He is alert and oriented x2.  Mood is appropriate.  Affect is blunted  SKIN:  Shows no rashes.  LYMPHATIC EXAM: Shows no edema.  He has no anterior cervical  lymphadenopathy.  He has shotty inguinal lymphadenopathy.   LABORATORY DATA:  He has a white blood cell count of 10.5, hemoglobin  14.2, hematocrit 42.1, segs of 91, platelets 130. Phosphorus was 4.2,  magnesium 2.3, calcium of 8.7.  His sodium is 138 with a potassium of  5.6.  He was given Kayexalate in the ED in addition to IV fluids and  thiamine.  Chloride was 103 with a CO2 of 15, anion gap mildly elevated  at around 20, glucose  166, BUN 27, creatinine 1.6, calcium 9.9.  Alcohol  level was less than 5.  His chest x-ray showed atelectasis versus  infiltrate in the left base which is unchanged compared to chest x-ray  of March 2010.  CT of the head showed no acute findings.  He has atrophy  and chronic microvascular ischemic changes.  EKG shows sinus tachycardia  at a ventricular rate of 102 beats per minute, no change compared to EKG  August 14, 2007.   ASSESSMENT/PLAN:  This is a patient with a history of alcoholism who now  presents with possible alcohol withdrawal seizures.  At this time will  admit to step-down ICU area to receive IV fluids, multivitamin, thiamine  and folate.  We will follow his neuro status closely and give Ativan as  needed for withdrawal protocol.  We will check ammonia LFTs, PT/PTT and  GI and DVT prophylaxis will be ordered.  Consider case management  consult in the morning.      Darryl D. Prime, MD  Electronically Signed     DDP/MEDQ  D:  10/06/2008  T:  10/06/2008  Job:  [id number]

## 2010-09-27 NOTE — Discharge Summary (Signed)
[name], [name]                  ACCOUNT NO.:  [id number]   MEDICAL RECORD NO.:  [id number]          PATIENT TYPE:  INP   LOCATION:  1601                         FACILITY:  [facility]   PHYSICIAN:  Almedia Balls. Ranell Patrick, M.D. DATE OF BIRTH:  05/17/1937   DATE OF ADMISSION:  08/19/2007  DATE OF DISCHARGE:  08/23/2007                               DISCHARGE SUMMARY   Discharged to [facility] skilled nursing facility.   ADMISSION DIAGNOSES:  1. Right hip fracture.  2. Admission anemia with hemoglobin of 11.0.   DISCHARGE DIAGNOSES:  1. Right hip hemiarthroplasty.  2,  Acute postoperative blood loss anemia on chronic anemia, received  total units of 4 packed red blood cells with improvement of hemoglobin  without any untoward event.   HISTORY OF PRESENT ILLNESS:  The patient was admitted through the  emergency room to Dr. Ranell Patrick' service for right hip pain after a fall.  Evaluations found he had a angulated impacted femoral neck fracture.  Dr. Ranell Patrick felt a hemiarthroplasty was appropriate procedure and the  patient was set up and evaluated and admitted for this issue.   MEDICATIONS ON ADMISSION:  None.   No known drug allergies.   SURGICAL PROCEDURES:  On August 19, 2007, the patient was admitted to  [facility] under the care of Dr. Malon Kindle.  On April 7  the patient was taken to the OR where a right hip hemiarthroplasty was  performed with the assistance of Alphonsa Overall, P.A.  Procedure was done  under general anesthesia.  Estimated blood loss 250 mL.  There were no  complications.  The patient had the following components implanted:  A  size 4 cemented [place], a size 3 cement restricter, a size 11 mm stem  centralizer, a size +0 tapered spacer, a size 50 fracture head.  All  components were implanted with methylmethacrylate.  The patient was  transferred to the recovery room and then to the orthopedic floor for  postoperative care.   HOSPITAL COURSE:  On August 19, 2007, the patient was admitted through  [facility] ER to Dr. Ranell Patrick' service after undergoing a fall and  evaluation in the ER found to have a right hip fracture.  The patient  then was taken to the OR on the following day.  His hemoglobin was 8.8.  He was typed and crossed and transfused 2 units packed red blood cells  and then later that evening the patient has his hemiarthroplasty surgery  performed.  The patient had no complications.  The patient was  transferred to the recovery room and then to the orthopedic floor for  routine postoperative care on IV antibiotics and pain medicines.  The  patient postoperatively had just some right hip pain.  His leg remained  neuromotor/vascularly intact.  His wound remained benign for any signs  of infection.  Postoperatively, the patient's hemoglobin once again  dropped to 7.6.  He was typed, crossed and transfused 2 units of packed  red blood cells with his hemoglobin improving to 11.1 with a hematocrit  of 31.5 on date  of discharge.  The patient's wound was benign for any  signs of infection on discharge, his leg remained neuromotor/vascularly  intact.  The patient was having a difficult time with his recovery due  to his frailty preoperative.  It was felt that a skilled nursing  facility would be appropriate.  The patient had a skilled nursing home  search performed and [facility] had a bed available and the  patient will be transferred to that facility in good condition for his  routine postoperative care.   LABORATORIES:  CBC on admission found wbc's 8.2, hemoglobin 11.0,  hematocrit 31.9.  Postoperatively, on postoperative day #2, his  hemoglobin dropped to 7.6 with a hematocrit of 21.5.  He was typed and  crossed and transfused 2 units of packed red blood cells.  On date of  discharge his hemoglobin was 11.1 with a hematocrit of 31.5.  His  routine chemistries on date of discharge found sodium 134 but he was  asymptomatic so  no treatment was necessary.  His potassium was 4.9, his  glucose 105, his estimated GFR was greater than 60.  The patient  received a total units of 4 units of packed red blood cells during his  hospitalization; 2 were immediately preoperatively, 2 were during his  postoperative phase.  Chest x-ray on admission found mild cardiomegaly  and no acute findings.  X-ray on admission found lateral femoral neck  cortical disruption, probable mild impaction compatible with right  femoral neck fracture.  EKG on admission found sinus rhythm with  premature atrial complexes at 88 beats per minute.   DISCHARGE INSTRUCTIONS:  1. Diet:  No restrictions.  2. Activity:  Weightbearing as tolerated with the use of a walker and      physical therapy supervision.  3. Knee immobilizer on while in bed.  4. Wound care:  The patient should have his dressing changed on a      daily basis and then on a p.r.n. basis for any soaking.  5. Staples removed 2 weeks from date of surgery which was April 7.      Please apply Steri-Strips.   MEDICATIONS:  1. Multivitamins one tablet daily.  2. Vitamin B1 100 mg daily.  3. Robaxin 500 mg every 6 hours p.r.n.  4. Colace one tablet b.i.d.  5. Trinsicon one tablet three times a day.  6. Lovenox subcu injections 30 mg every 12 hours for a total of 14      days.  7. Atacand 1 mg every 4 hours p.r.n.  8. Reglan 10 mg p.o. q.6h. p.r.n.  9. Tylenol 650 mg p.o. q.4h. p.r.n.  10.Percocet one or two tablets every 4-6 hours p.r.n. pain.   FOLLOW-UP:  The patient should have a follow-up appointment with Dr.  Ranell Patrick in his office at 2 weeks at discharge from [facility]  Please call [phone] to make arrangements for that appointment.   The patient's condition upon discharge to [facility] skilled nursing  facility improved.      Jamelle Rushing, P.A.      Almedia Balls. Ranell Patrick, M.D.  Electronically Signed    RWK/MEDQ  D:  08/23/2007  T:  08/23/2007  Job:  [id number]

## 2010-09-27 NOTE — Consult Note (Signed)
NAMESEATON, HOFMANN NO.:  [id number]   MEDICAL RECORD NO.:  [id number]          PATIENT TYPE:  INP   LOCATION:  3701                         FACILITY:  [facility]   PHYSICIAN:  Antonietta Breach, M.D.  DATE OF BIRTH:  02-24-1935   DATE OF CONSULTATION:  10/07/2008  DATE OF DISCHARGE:                                 CONSULTATION   REASON FOR CONSULTATION:  Mental status changes, evaluate, make  recommendations and assess capacity.   HISTORY OF PRESENT ILLNESS:  [name] is a 76 year old male admitted to  the [facility] on the Oct 05, 2008 due to convulsions.   He has been experiencing approximately 3 weeks of progressive impairment  in memory as well as judgment.  He is able to say his name.  However, he  could not name the place.  He does have the ability to recall that he is  from [place].  He is not agitated or combative.  He has impairment in  reasoning.   He does not appear to be experiencing any internal simulation.   Possible factors involved in his condition include the convulsions as  well as possibly a nutritional deficiency associated with his alcoholic  lifestyle.   PAST PSYCHIATRIC HISTORY:  The duration of his memory impairment is  unknown.  Also the duration of his impaired judgment and executive  function is unknown.   FAMILY PSYCHIATRIC HISTORY:  None known.   SOCIAL HISTORY:  [name] does live by himself.  He is single.  His  religion is [employer].  There are no known illegal drugs, he does have a  history of alcohol use.   PAST MEDICAL HISTORY:  Convulsions, dementia.   MEDICATIONS:  MAR is reviewed.   REVIEW OF SYSTEMS:  Noncontributory.   PHYSICAL EXAMINATION:  VITAL SIGNS:  Temperature 98.0, pulse 81,  respiratory rate 16, blood pressure 151/85, O2 saturation on room air is  94%.  GENERAL APPEARANCE:  [name] is an elderly male lying in a supine  position in his hospital bed with no abnormal involuntary movements.   MENTAL  STATUS EXAM:  [name] has poor eye contact.  He has slight  stupor.  His affect is flat.  His mood is within normal limits.  Concentration decreased.  On orientation testing he knows his name.  He  does not know the place.  He has impaired short-term recall.  However,  he is able to remember that he is from [place].  Fund of knowledge and  intelligence are severely below that of the estimated premorbid  baseline.  His speech is very soft.  Thought process involves  confabulation.  Thought content; no thoughts of harming himself or  others, no delusions or hallucinations.  His insight is poor.  Judgment  is impaired.   ASSESSMENT:  AXIS I:  293.00 delirium not otherwise specified.  Rule out  dementia not otherwise specified.  AXIS II:  Deferred.  AXIS III:  See past medical history.  AXIS IV:  General medical, primary support group.  AXIS V:  30.   [name]  demonstrates critical impairments in his memory and reasoning.  He does not have the capacity for informed consent.   His decapacitating diagnosis is dementia not otherwise specified.   RECOMMENDATIONS:  1. Would confirm that a reversible memory dysfunction etiology workup      has been conducted including in addition to CBC and liver function      tests, RPR, TSH, B12 and folic acid.  2. Thiamine 100 mg daily.  3. If he does continue with memory dysfunction, I would consider      starting Aricept 5 mg p.o. q.h.s. with caution regarding      cholinergic gastrointestinal side effects such as diarrhea.      Antonietta Breach, M.D.  Electronically Signed     JW/MEDQ  D:  10/11/2008  T:  10/11/2008  Job:  [id number]

## 2010-09-27 NOTE — Discharge Summary (Signed)
[name], [name]                  ACCOUNT NO.:  [id number]   MEDICAL RECORD NO.:  [id number]          PATIENT TYPE:  INP   LOCATION:  3701                         FACILITY:  [facility]   PHYSICIAN:  Hind I Elsaid, MD      DATE OF BIRTH:  05/02/1935   DATE OF ADMISSION:  10/06/2008  DATE OF DISCHARGE:  10/13/2008                               DISCHARGE SUMMARY   DISCHARGE DIAGNOSES:  1. Altered mental status, resolved, felt could be secondary to alcohol      withdrawal in addition to baseline dementia.  Patient at baseline      at this time.  2. Alcohol withdrawal seizure.  3. Alcoholic hepatitis, improved.  4. Thrombocytopenia felt to be secondary to alcohol abuse.  5. Peripheral neuropathy.  6. Left arm cellulitis.  7. History of generalized joint disease.  8. History of right hip arthroplasty in 2009.  9. History of alcohol abuse.   DISCHARGE MEDICATIONS:  1. Doxycycline 100 mg p.o. b.i.d. for 1 week.  2. Folic acid 1 mg p.o. daily.  3. Thiamine 100 mg p.o. daily.  4. Multivitamin 1 tab daily.  5. Protonix 40 mg daily.  6. Ativan 0.5 to 1 mg p.o. q.6 h. p.r.n.   PROCEDURES:  1. CT head without contrast:  No acute intracranial abnormality,      atrophy and chronic microvascular disease.  2. Chest x-ray:  Chronic atelectasis or infiltrate left lower lobe,      unchanged.   HISTORY OF PRESENT ILLNESS/HOSPITAL COURSE:  1. This is a 76 year old male with a history of alcoholism, history of      chronic liver disease, history of anemia and history of peripheral      neuropathy, who was apparently at home and fell.  The family heard      him hit the floor.  When they saw him, he was foaming in the mouth      and incontinent of his speech.  He was unresponsive except to pain.      Brought to the emergency room.  The patient received IV fluids and      remained on Kayexalate.  Ativan also was given for history of      alcohol abuse.  In the emergency room, vital signs were  temperature      of 97.6, blood pressure 147/89, pulse rate 116, respiratory rate      16, saturating 99% on room air.  Sodium 138 and potassium 15.6.  He      was given Kayexalate in the emergency room in addition to IV      fluids.  The patient admitted to stepdown unit for close      observation for seizure precautions.  2. Altered mental status.  The patient admitted to stepdown for 24-48      hour observation.  IV fluids, thiamine IV and folate were given.      The patient was placed on Ativan protocol and has remained.  CT      head was negative.  Reversible cause of his altered mental status  including ammonia and drug screen was negative.  Ammonia was mildly      elevated at 39.  TSH 0.740.  EEG was done, of course we do not have      it at this time and we felt the altered mental status with a      possibility of seizure.  It was felt the altered mental status      could be due to alcohol withdrawal and the seizure most probably to      alcohol withdrawal.  No further seizure witnessed during hospital      stay.  Altered mental status completely resolved.  3. Elevated LFTs felt to be secondary to alcohol.  The patient when he      came, AST was 87 and ALT 67 which improved significantly during      hospital stay.  4. Acute renal failure, completely resolved.  The patient's creatinine      yesterday of 1.04 and BUN of 8.  5. The patient complained of left forearm erythema and pain.  On      examination, there is some erythema and swelling, but there is no      fluctuation.  We felt it could be secondary to      vasculitis/cellulitis from the IV line.  The patient has no IV line      at this time.  The patient will be discharged with doxycycline 100      mg p.o. q.12 h. for 1 week.  We recommend nursing home physician to      address if it is getting worse.  6. Hypokalemia status post replacement.  Swallow and speech did      evaluate the patient and recommend mechanical soft  with thin      liquids.  7. Anemia.  Hemoglobin remained stable around 10.8.   DISCHARGE INSTRUCTIONS:  1. At this time, we feel the patient is medically stable to be      discharged to SNF.  2. Follow up with his primary care physician as outpatient.      Hind Bosie Helper, MD  Electronically Signed     HIE/MEDQ  D:  10/13/2008  T:  10/13/2008  Job:  [id number]

## 2010-09-27 NOTE — Procedures (Signed)
EEG NUMBER:  [id number].   ROOM:  37/17.   This patient's primary attending is Dr. Eda Paschal.   The technician is Harriett Sine.   The data for the EEG is Oct 09, 2008.   This is a portable study described as performed in a 16-channel montage  without hyperventilation and without photic stimulation.  The patient  was awake and asleep.  He is also right handed, currently in room 37/14.  This 76 year old was admitted with alcohol withdrawal seizures, had  fallen at home with foaming at the mouth and was incontinent of stool,  has a long-standing history of alcohol abuse leading to chronic liver  disease, anemia, neuropathy.  The patient was in wrist restraints when  this EEG was written.   He is currently on multivitamins including thiamine and folic acid,  Protonix, Chronulac, Ativan, K-Dur, Flagyl, Reglan, and Imodium.   DESCRIPTION:  A posterior-dominant background rhythm is very difficult  to establish for this gentleman because of an extremely low-amplitude  EEG with beta fast activity, predominantly in the frontal and central  region.  Beta fast activity of this nature is seen with benzodiazepine  use but also with other GABAergic medications as a side effect and can  be caused by alcohol itself.  The patient is restless, fighting his  wrist restraints which further complicates the recording.  The EKG shows  a sinus rhythm of 58-67 beats per minute.  Frequent eye blinking is  noted.  This is not epileptiform activity.  Finally, a brief period  where the patient keeps his eyes closed without movements and a  posterior-dominant rhythm cannot still not be elicited because of  underlying swaying probably from a diaphoretic interference with the  skin contact and electrode.  My best guess as to a posterior-dominant  rhythm is around 8 Hz.  The patient entered sleep quickly.  There are  plenty of sleep spindles noticed at vertex sharp activity.  Finally, he  enters a 3-4 Hz delta sleep briefly.   This is again not epileptiform  activity and associated with light snoring as reported by the  technician.   CONCLUSION:  This EEG cannot truly document wakefulness or alertness as  these mental states are in this patient associated with motion and  motion artifact and constant struggling.  Talking to the patient and  trying to calm him down worked for about 30 seconds in this recording  and allowed the brief establishment of a posterior-dominant rhythm at  about 8 Hz.  Again, epileptiform activity could not be seen in this  recording.  The study was very difficult to perform for the technician  and difficult to interpret for the reader.  I would like to suggest to  repeat the study in an  outpatient setting when the patient is not longer actively withdrawing  from alcohol abuse and may have completed his detox phase.      Melvyn Novas, M.D.  Electronically Signed     ZO:XWRU  D:  10/11/2008 09:30:02  T:  10/12/2008 00:07:49  Job #:  [id number]

## 2010-09-27 NOTE — H&P (Signed)
[name], [name]                  ACCOUNT NO.:  [id number]   MEDICAL RECORD NO.:  [id number]          PATIENT TYPE:  INP   LOCATION:  0102                         FACILITY:  [facility]   PHYSICIAN:  Oswald Hillock, MD        DATE OF BIRTH:  1935-01-21   DATE OF ADMISSION:  07/23/2008  DATE OF DISCHARGE:                              HISTORY & PHYSICAL   CHIEF COMPLAINT:  Altered mental status.   HISTORY OF PRESENT ILLNESS:  The patient is a 76 year old [place]  American male old [place]  American male, resident of a boarding home who presents to the emergency  room via EMS, status post fall yesterday.  Apparently, the patient  refused any assistance and laid on the floor for about 16 hours, and he  was finally placed in his bed.  His fellow residents finally convinced  him to go to the hospital.  EMS was contacted, and he was brought here  for further evaluation.  The patient continues to be very confused.  He  does remember vaguely about falling, but is not able to give any further  details.  At the time of this interview, the patient denies any chest  pain, any shortness of breath, any dizziness or any focal weakness of  any part of the body.  He does complain of bilateral lower extremity  pain, mainly localized to the left hip and both thighs.   PAST MEDICAL HISTORY:  1. Anemia.  2. Arthritis.  3. Bradycardia.  4. Alcohol abuse.  5. Chronic liver disease.  6. Peripheral neuropathy.   PAST SURGICAL HISTORY:  The patient has had a right hip arthroplasty in  2009.   CURRENT MEDICATIONS:  None.   ALLERGIES:  NO KNOWN DRUG ALLERGIES.   FAMILY HISTORY:  No records of any family history are available.  The  patient unable to provide any.   REVIEW OF SYSTEMS:  Difficult to obtain given the patient's condition;  however, the patient denies any cough, any fever, rigors, chills,  abdominal pain, nausea, vomiting, diarrhea or any urinary symptoms.   PHYSICAL EXAMINATION:  VITAL SIGNS:  On admission, pulse 111,  blood  pressure 120/81, respiratory rate of 18, temperature 97.  O2 saturations  of 97% on room air.  GENERAL:  Elderly African American male in no acute distress, alert, but  not oriented to person or time.  He does, however, clearly point out  that he is in the hospital at [facility].  He has significant deficits  in both recent, as well as remote memory.  HEENT:  No scleral icterus.  Positive pallor.  Ears negative.  Poor oral  dental hygiene.  The patient has only two front teeth.  Mucous membranes  are dry.  NECK:  Supple.  No lymphadenopathy.  No JVD.  No carotid bruits.  CHEST:  Breath sounds heard bilaterally.  Fair air entry.  Occasional  rhonchi.  CVS:  S1-S2 regular.  Tachycardia.  No gallop or rub.  ABDOMEN:  Soft, nontender.  Bowel sounds are present.  EXTREMITIES:  No cyanosis, clubbing or edema.  NEUROLOGIC:  Cranial  nerves II-XII are grossly intact.  The patient  moves all four extremities; however, range of motion is limited in the  lower extremities because of pain.  No localized tenderness over the hip  areas, but the patient does have tenderness in the thighs.  Peripheral  pulses are present.  Surgical scar noted on the right hip.  Cranial  nerves II-XII are grossly intact.  The patient moves all four  extremities.  Plantars are bilaterally downgoing.   LABORATORY DATA:  His WBC count was 18.9, hemoglobin 11.8, hematocrit  35.4, platelet count of 68.  Sodium 138, potassium 4.7, chloride 108,  CO2 of 20, BUN 59, creatinine 2.8.  Chest x-ray showed cardiomegaly with  acute cardiopulmonary disease.  Remote bilateral rib trauma, question  loss of upper lumbar vertebral body height.  Left hip x-ray showed no  acute findings into a right hip arthroplasty.  Urinalysis showed 7-10  WBCs, 2-6 RBCs, many bacteria, moderate leukocyte esterase.   IMPRESSION AND PLAN:  This is the case of a 76 year old [place]  male old [place] American  male, resident of a boarding home who presents status  post fall with  altered mental status and bilateral lower extremity pain.   1. Altered mental status, baseline mental status unknown.  However,      the patient is significantly confused.  He does have acute renal      failure and leukocytosis, which could both be contributing to his      current mental status.  We will admit him for observation.  His CT      scan of the head did not show any acute findings, except for      cerebral atrophy and mild small-vessel ischemic change.  The      patient is afebrile and does not have any findings worrisome for      possible CNS infection, including meningitis/encephalitis at this      time.  However, we will monitor him closely and make further      recommendations based on how he does.  2. Acute renal failure with rhabdomyolysis.  The patient's creatinine      is elevated to 2.8, even though his CPK level is only mildly      elevated at 3000, it probably has a role in his renal failure as      well.  We will check spot urinary sodium, start him on IV fluids      and monitor renal function closely.  If his renal function does not      show signs of improvement or worsens, we will consult nephrology.  3. Leukocytosis with evidence of a urinary tract infection on      urinalysis.  The patient does have a mild urinary tract infection      based on his urinalysis, however, his leukocytosis is out of      proportion to the urinary tract infection.  Will get blood cultures      x2, start the patient empirically on Avelox and monitor counts      closely.  4. History of alcohol abuse.  We will continue the patient on thiamine      and folate and use Ativan on a p.r.n. basis for DT prophylaxis.  5. Deep venous thrombosis/gastrointestinal prophylaxis.  Protonix and      compression devices.      Oswald Hillock, MD  Electronically Signed     BA/MEDQ  D:  07/23/2008  T:  07/23/2008  Job:  [id number]  cc:   Boarding Home

## 2010-09-27 NOTE — Op Note (Signed)
[name], [name]                  ACCOUNT NO.:  [id number]   MEDICAL RECORD NO.:  [id number]          PATIENT TYPE:  INP   LOCATION:  1601                         FACILITY:  [facility]   PHYSICIAN:  Almedia Balls. Ranell Patrick, M.D. DATE OF BIRTH:  September 10, 1934   DATE OF PROCEDURE:  DATE OF DISCHARGE:                               OPERATIVE REPORT   PREOPERATIVE DIAGNOSIS:  Right displaced femoral neck fracture.   POSTOPERATIVE DIAGNOSIS:  Right displaced femoral neck fracture.   PROCEDURE PERFORMED:  Right hip arthroplasty using DePuy Summit basic .   SURGEON:  Almedia Balls. Ranell Patrick, M.D.   ASSISTANT:  Donnie Coffin. Durwin Nora, P.A.   ANESTHESIA:  General.   ESTIMATED BLOOD LOSS:  250 cc.   FLUIDS REPLACED:  Crystalloid 1500 cc.   URINE OUTPUT 150cc .   Patient is a 76 year old gentleman who presented to [facility]  emergency room acutely intoxicated with a blood alcohol level of 280.  He suffered a ground level fall that he does not remember.  He is  complaining of right hip pain.  Patient is unable to weight bear and had  an irritable hip exam.  Radiographs demonstrated rotated, displaced  femoral neck fracture.  Patient presents now for operative treatment.  Informed consent was obtained.   DESCRIPTION OF PROCEDURE:  After an adequate level of anesthesia was  achieved, the patient was positioned supine on the operating room table.  He was brought up to the left lateral decubitus position with the right  hip up.  After securing the patient to the table and padding  neurovascular structures appropriately, performed sterile prep and drape  of the right hip.  We then entered the hip via a posterior incision.  This was the [facility] approach.  The incision was carried down  through the subcutaneous tissues to the tensor fascia lata, which was  split in line with the skin incision and at the gluteus maximus.  We  then identified the gluteus medius and retracted it out of the way,  divided the  short external rotators sharply off the posterior femur  using a Bovie.  We then identified the femoral neck fracture.  We used a  broach to identify the level of the femoral neck cut, which should be  cut in line with the broach one finger breadth above the lesser  trochanter.  This was done using an off-loading saw.  The remainder of  the excess broken femoral neck was removed using rongeur.  We then  removed the femoral head and tied that to a size 50 and trialed the 50  in the socket.  The patient's articular cartilage in the socket was in  good shape.  Next, went ahead and removed that trial femoral head and  then sequentially broached up to a size 4, cemented Summit basic stem.  We went ahead and placed our trial 4 stem with a +0 neck and the 50  head, reduced the hip, and had excellent leg length and soft tissue  balance with stability.  Removed all trial components.  Placed the  cement restrictor, and  then using a third-generation cementing  technique, cemented the components into place.  Once the cement was left  to harden, we went ahead and placed the 0 place and the 50 head ball,  which was a monopolar head and impacted that in place.  Reduced the hip.  We were happy, again, with final leg length and soft tissue balancing.  We then closed the capsule with interrupted #1 Vicryl suture followed by  a closure of the tensor fascia lata/gluteus maximus with interrupted  figure-of-eight 0 Vicryl suture, and a portion of that was running over  the gluteus maximus fascia.  At this point, we closed the subcutaneous  tissues in layered closure with 0 Vicryl and 2-0 Vicryl closure and 4-0  Monocryl over the skin, Steri-Strips and sterile dressing.  Patient  tolerated the surgery very well.  Was taken in a supine position, the  knee immobilizer placed, and taken to the recovery room.      Almedia Balls. Ranell Patrick, M.D.  Electronically Signed     SRN/MEDQ  D:  08/21/2007  T:  08/21/2007   Job:  [id number]

## 2010-09-27 NOTE — Discharge Summary (Signed)
[name], [name]                  ACCOUNT NO.:  [id number]   MEDICAL RECORD NO.:  [id number]          PATIENT TYPE:  INP   LOCATION:  1313                         FACILITY:  [facility]   PHYSICIAN:  Hillery Aldo, M.D.   DATE OF BIRTH:  1935/02/26   DATE OF ADMISSION:  07/22/2008  DATE OF DISCHARGE:  07/29/2008                               DISCHARGE SUMMARY   Discharge pending bed availability at skilled nursing facility.   PRIMARY CARE PHYSICIAN:  Dr. Emeline Darling at [facility].   DISCHARGE DIAGNOSES:  1. Altered mental status.  2. Fall with failure to thrive.  3. Chronic alcohol abuse.  4. Acute renal failure.  5. Rhabdomyolysis.  6. Anemia of chronic disease status post 2 units of packed red blood      cells.  7. Urinary tract infection with leukocytosis.  8. Alcohol-induced hepatitis.  9. Chronic thrombocytopenia secondary to alcohol abuse.  10.Hypokalemia.  11.Hypomagnesemia  12.Lower extremity pain likely secondary to peripheral neuropathy.  13.Cholelithiasis.   DISCHARGE MEDICATIONS:  1. Folic acid 1 mg p.o. daily.  2. Magnesium oxide 400 mg p.o. q.i.d. times 1 week, then recheck      magnesium level.  3. Protonix 40 mg daily p.o.  4. Lyrica 50 mg p.o. t.i.d.  5. Thiamine 100 mg p.o. daily.  6. Tylenol 650 mg p.o. q.6h. p.r.n. pain or fever.  7. Dulcolax suppository 10 mg PR daily p.r.n. constipation.  8. Ativan 1 mg p.o. q.4h. p.r.n. agitation/anxiety.   CONSULTATIONS:  None.   BRIEF ADMISSION HPI:  The patient is a 76 year old chronic alcoholic  male who lives in a boarding home and presented to the emergency  department on the date of admission after falling.  The patient  apparently was on the floor for approximately 16 hours before finally  being placed in his bed.  Fellow residents convinced him to call EMS.  The patient was noted to be confused on initial presentation and was  admitted for further evaluation and workup.   PROCEDURES AND DIAGNOSTIC STUDIES:  1.  Left hip films on July 23, 2008, showed no acute findings about      the left hip.  Interval right hip arthroplasty  2. Chest x-ray on July 23, 2008, showed cardiomegaly without acute      cardiopulmonary disease.  Remote bilateral rib trauma.      Questionable loss of upper lumbar vertebral body height.  3. CT scan of the head on July 23, 2008, showed no acute intracranial      abnormality.  Cerebral atrophy and mild small vessel ischemic      changes.  4. Chest x-ray on July 24, 2008, showed no acute infiltrate or edema.      Bibasilar atelectasis.  5. Abdominal ultrasound on July 24, 2008, showed mobile gallstones      and sludge noted within the gallbladder without thickening of the      gallbladder wall.  No hydronephrosis or diagnostic renal calculus.      The pancreas and abdominal aorta were not visualized due to      abundant bowel  gas.   HOSPITAL COURSE:  1. Altered mental status:  The patient's mental status changes were      likely due to acute alcohol intoxication.  His mental status now      appears to be at baseline.  2. Fall/failure to thrive:  The patient's fall is likely      multifactorial.  Alcohol intoxication in the setting of peripheral      neuropathy and deconditioning likely all contributed.  The patient      was seen in consultation with physical and occupational therapist      here in the hospital and recommendations are for the patient to be      sent to a skilled nursing facility for rehab.  3. Acute renal failure:  The patient's renal failure resolved with IV      fluid rehydration.  Problem.  4. Rhabdomyolysis secondary to fall:  The patient's CK was monitored      and he was kept on IV fluids until his CK normalized.  5. Anemia of chronic disease:  The patient's hemoglobin and hematocrit      have remained stable.  Fecal occult blood testing was negative      times one.  An anemia panel was undertaken to determine the      underlying source of  his anemia and the findings showed an iron      normal at 58, total iron binding capacity low at 170, 34%      saturation with normal B12 and serum folate levels.  Ferritin was      elevated a 1378.  These findings are all consistent with anemia of      chronic disease and alcohol abuse.  6. Leukocytosis/urinary tract infection:  The patient received empiric      Avelox prior to the collection of urine for culture.  Nevertheless,      his initial presenting urinalysis was consistent with the urinary      tract infection.  There was 7-10 white blood cells and many      bacteria seen on microscopy.  The patient completed 5 days of      treatment with empiric Avelox.  7. Alcohol-induced hepatitis:  The patient's transaminases and      bilirubin were elevated upon initial presentation.  These trended      to normal over time, indicating that his acute hepatitis was likely      alcohol induced.  8. Thrombocytopenia:  The patient's thrombocytopenia is chronic and      likely due to the toxic effects of alcohol on the bone marrow.  He      had no signs or symptoms of acute blood loss.  9. Hypokalemia:  The patient was appropriately repleted.  10.Hypomagnesemia:  The patient is being repleted.  His magnesium at      discharge is still on the low side.  We will continue him on      magnesium oxide for an additional week of treatment and he should      have a magnesium level drawn in 1 week time to determine if any      further supplementation as needed.  11.Lower extremity pain in the setting of known peripheral neuropathy.      The patient has complained of significant lower extremity pain and      this is likely due to underlying peripheral neuropathy.  The      patient was started on Lyrica 50 mg  t.i.d.  This can be titrated up      if needed.  He should continue to receive physical and occupational      therapy at the skilled nursing facility.   DISPOSITION:  The patient is medically stable  for discharge to a skilled  nursing facility.  He will be discharged when a bed is identified.   Time spent coordinating care for discharge and in discharge instructions  equals 35 minutes.      Hillery Aldo, M.D.  Electronically Signed     CR/MEDQ  D:  07/29/2008  T:  07/29/2008  Job:  [id number]   cc:   Emeline Darling, Dr.  Dala Dock

## 2010-09-30 NOTE — H&P (Signed)
NAMEMEYER, ARORA NO.:  [id number]   MEDICAL RECORD NO.:  [id number]          PATIENT TYPE:  EMS   LOCATION:  ED                           FACILITY:  [facility]   PHYSICIAN:  Isidor Holts, M.D.  DATE OF BIRTH:  05/17/1937   DATE OF ADMISSION:  10/19/2004  DATE OF DISCHARGE:                                HISTORY & PHYSICAL   PRIMARY CARE PHYSICIAN:  Unassigned.   CHIEF COMPLAINT:  Pain, swelling, redness in both legs for approximately  three days.   HISTORY OF PRESENT ILLNESS:  This is a 76 year old male, with no significant  past medical history, who presents to the emergency department with a three-  day history of swelling, pain, and redness in both lower extremities. He  denies fever or chills. Denies history of trauma. The patient states that he  gets intermittent lower extremity swelling. This usually resolves  spontaneously, but on this occasion the swelling has persisted and he is  having some difficulty walking secondary to soreness.  Denies shortness of  breath.   PAST MEDICAL HISTORY:  Nothing else significant.   MEDICATIONS:  Not on any regular medications.   ALLERGIES:  No known drug allergies.   REVIEW OF SYMPTOMS:  As per HPI and chief complaint. Otherwise negative.   FAMILY HISTORY:  Noncontributory.   SOCIAL HISTORY:  The patient is divorced and retired. Nonsmoker. Drinks  alcohol, usually beer. Tends to binge. His last drink was October 19, 2004 and  at that time he took a quart of beer. No history of drug abuse.   FAMILY HISTORY:  The patient has two offspring both of whom are alive and  well.   PHYSICAL EXAMINATION:  VITAL SIGNS:  Temperature 97.1, pulse 64 per minute  and regular, respiratory rate 20, blood pressure 131/75 mmHg. Pulse oximeter  97% on room air.  GENERAL:  The patient does not appear to be in obvious acute distress. He is  alert, communicative, and not short of breath at rest.  HEENT:  No clinical pallor. No  jaundice. No conjunctival injection. Throat  appears quite clear.  NECK:  Supple. JVP not seen. No palpable lymphadenopathy. No palpable  goiter. No carotid bruits.  CHEST:  Clinically clear to auscultation. No wheezes and no crackles.  HEART:  S1 and S2 heard. Normal regular and no murmurs.  ABDOMEN:  Full, soft, and nontender. There is no palpable organomegaly. No  palpable masses. Normal bowel sounds.  EXTREMITIES:  The patient has bilateral lower extremity swelling and redness  with increased local temperature. There is no obvious trauma or tinea  interdigitalis. Peripheral pulses are palpable.  NEUROLOGICAL:  No focal neurologic deficit on gross examination.  MUSCULOSKELETAL:  Not formally examined.   LABORATORY DATA:  CBC shows a WBC of 6.0, hemoglobin 10.6, hematocrit 31.3,  platelets 176,000. D-dimer is 0.44. Electrolytes show sodium 136, potassium  4.2, chloride 102. CO2 29, BUN 14, creatinine 1.2, glucose 90. Alcohol level  287.   ASSESSMENT/PLAN:  1.  Bilateral lower extremity cellulitis:  We will admit the patient, do  blood cultures and then commence him on intravenous antibiotic, i.e.      Ancef. Analgesics will be administered p.r.n.   1.  Bilateral lower extremity edema likely secondary to #1 above: The      patient has a history of intermittent lower extremity swelling.      Certainly, he has no clinical evidence of heart failure. We will check      albumin level and urinalysis to rule out nephrosis. Venous Doppler done      at the time of initial evaluation is negative for deep vein thrombosis.      Of course, the culprit may be peripheral venous insufficiency. We will      elevate the patient's legs for now and apply TED stockings if tolerated.   1.  History of alcohol abuse:  We will administer vitamin supplements, i.e.,      thiamine, multivitamin, and folate, and watch for alcohol withdrawal. He      will also, likely need substance abuse counseling. We  shall institute      this accordingly.   Meanwhile, we will institute deep vein thrombosis and gastrointestinal  prophylaxis with Lovenox and proton pump inhibitor respectively.  Further management will depend on clinical course.       CO/MEDQ  D:  10/19/2004  T:  10/19/2004  Job:  [id number]

## 2010-09-30 NOTE — Discharge Summary (Signed)
NAMEKENNER, [name]                  ACCOUNT NO.:  [id number]   MEDICAL RECORD NO.:  [id number]          PATIENT TYPE:  INP   LOCATION:  1414                         FACILITY:  [facility]   PHYSICIAN:  Isidor Holts, M.D.  DATE OF BIRTH:  05/17/1937   DATE OF ADMISSION:  10/19/2004  DATE OF DISCHARGE:  10/24/2004                                 DISCHARGE SUMMARY   DISCHARGE DIAGNOSES:  1.  Bilateral lower extremity cellulitis.  2.  Alcohol abuse.  3.  Chronic liver disease with iron overload, rule out hemachromatosis.  4.  Bicytopenia.  5.  Peripheral neuropathy.   DISCHARGE MEDICATIONS:  1.  Keflex 500 mg p.o. q.i.d. x 5 days only.  2.  Neurontin 300 mg p.o. t.i.d.   PROCEDURE:  None.   CONSULTATION:  None.   ADMISSION HISTORY:  As in H&P notes of October 19, 2004.  However, in brief,  this is a 76 year old male with no significant past medical history, who  presents with a 3 day history of bilateral lower extremity swelling, pain  and redness.  He has a history of excessive alcohol use.  The patient was  admitted for further evaluation and investigation and management.   CLINICAL COURSE:  1.  Bilateral lower extremity cellulitis:  The patient presents with a 3 day      history of bilateral lower extremity swelling, pain, redness, and      increased local temperature.  He was managed with parenteral Ancef, with      good clinical response.  On detailed questioning, the patient reveals      that he has had bilateral lower extremity swelling on and off, but that      this eventually subsides spontaneously.  Lower extremity swelling was      managed with elevation of both lower extremities.  Serum albumin was      quite reasonable at 3.2.  Urinalysis was negative for proteinuria,      effectively ruling out nephrotic syndrome.  The patient's cellulitis      responded to intravenous antibiotics and on October 22, 2004, local      inflammatory phenomena had subsided so much so, that  patient was      switched to oral Keflex which he continued until discharge. Total course      of antibiotic treatment is planned for 10 days.   1.  Peripheral neuropathy:  During the course of the hospital stay, the      patient complained of bilateral burning discomfort of lower extremities,      which persisted even after inflammatory phenomena had clinically      subsided.  Physical examination revealed sensory blunting in both lower      extremities.  Features appear consistent with peripheral neuropathy.      Vitamin B12 and folate levels were within normal limits.  TSH was normal      at 1.461.  The patient was managed with Neurontin, with satisfactory      clinical response and amelioration of symptoms.   1.  History of alcohol abuse.  The patient admits drinking a significant      quantity of alcohol daily, with a tendency to binge.  Careful watch was      maintained for possible alcohol withdrawal phenomena during the course      of the patient's hospital stay, but none was observed.  He was placed on      vitamin supplements during the course of his hospital stay.   1.  Abnormal liver function tests:  At the time of admission, the patient      was found to have an AST of 102, ALT of 41 and alkaline phosphatase of      82.  This was deemed secondary to alcohol abuse, and serial LFTs      thereafter showed steady diminution in patient's AST levels.  As of October 23, 2004, AST was 63; ALT was 34.  However, the patient was noted to      have a bicytopenia with hemoglobin of 9.7 and platelet count of 146.      MCV was 96.6.  Hematinics were therefore done, including iron studies.      Vitamin B12 was found to be 308.  RBC folate was 750.  Ferritin level      was markedly elevated at 891, and iron was also markedly elevated at 271      with TIBC of 334.  Percent saturation was 81.  These features raised the      suspicion of iron overload syndrome and in particular  hemachromatosis.      GI consultation was considered therefore, as it was felt that this may      be the basis of patient's chronic liver disease, in addition to alcohol      excess.  Discussion was held with Dr. Virginia Rochester, who recommended outpatient      follow up with [facility] upon discharge.  This has been      implemented accordingly.   DISPOSITION:  The patient was discharged in satisfactory condition by Dr.  Cheree Ditto on October 24, 2004.   DIETARY RESTRICTIONS:  None.   ACTIVITY:  As tolerated.   WOUND CARE:  Not applicable.   SPECIAL INSTRUCTIONS:  Avoid alcohol completely.   FOLLOW UP ARRANGEMENTS:  1.  The patient has been arranged a follow up appointment with the medical      service at [facility].  He has an appointment for October 31, 2004, at      14:15.  He has been strongly urged to keep this appointment.  2.  He has also had follow up arrangements made with [facility], telephone      number [phone] in 3-4 weeks.  All of this has been communicated to      patient, and he has verbalized understanding.       CO/MEDQ  D:  10/25/2004  T:  10/25/2004  Job:  [id number]

## 2010-12-01 ENCOUNTER — Emergency Department (HOSPITAL_COMMUNITY): Payer: Medicare Other

## 2010-12-01 ENCOUNTER — Encounter: Payer: Self-pay | Admitting: Ophthalmology

## 2010-12-01 ENCOUNTER — Inpatient Hospital Stay (HOSPITAL_COMMUNITY)
Admission: EM | Admit: 2010-12-01 | Discharge: 2010-12-06 | DRG: 897 | Disposition: A | Discharge status: Skilled Nursing Facility | Payer: Medicare Other | Attending: Internal Medicine | Admitting: Internal Medicine

## 2010-12-01 DIAGNOSIS — F0150 Vascular dementia without behavioral disturbance: Secondary | ICD-10-CM | POA: Diagnosis present

## 2010-12-01 DIAGNOSIS — F1010 Alcohol abuse, uncomplicated: Secondary | ICD-10-CM | POA: Diagnosis present

## 2010-12-01 DIAGNOSIS — F0390 Unspecified dementia without behavioral disturbance: Secondary | ICD-10-CM | POA: Diagnosis present

## 2010-12-01 DIAGNOSIS — I672 Cerebral atherosclerosis: Secondary | ICD-10-CM | POA: Diagnosis present

## 2010-12-01 DIAGNOSIS — E46 Unspecified protein-calorie malnutrition: Secondary | ICD-10-CM | POA: Diagnosis present

## 2010-12-01 DIAGNOSIS — F1027 Alcohol dependence with alcohol-induced persisting dementia: Secondary | ICD-10-CM | POA: Diagnosis present

## 2010-12-01 DIAGNOSIS — F10939 Alcohol use, unspecified with withdrawal, unspecified: Principal | ICD-10-CM | POA: Diagnosis present

## 2010-12-01 DIAGNOSIS — I10 Essential (primary) hypertension: Secondary | ICD-10-CM | POA: Diagnosis present

## 2010-12-01 DIAGNOSIS — R4182 Altered mental status, unspecified: Secondary | ICD-10-CM | POA: Diagnosis present

## 2010-12-01 DIAGNOSIS — D72829 Elevated white blood cell count, unspecified: Secondary | ICD-10-CM | POA: Diagnosis present

## 2010-12-01 DIAGNOSIS — F10239 Alcohol dependence with withdrawal, unspecified: Principal | ICD-10-CM | POA: Diagnosis present

## 2010-12-01 LAB — URINE MICROSCOPIC-ADD ON

## 2010-12-01 LAB — MAGNESIUM: Magnesium: 1.7 mg/dL (ref 1.5–2.5)

## 2010-12-01 LAB — DIFFERENTIAL
Basophils Absolute: 0 10*3/uL (ref 0.0–0.1)
Lymphocytes Relative: 8 % — ABNORMAL LOW (ref 12–46)
Lymphs Abs: 0.9 10*3/uL (ref 0.7–4.0)
Neutro Abs: 10 10*3/uL — ABNORMAL HIGH (ref 1.7–7.7)

## 2010-12-01 LAB — URINALYSIS, ROUTINE W REFLEX MICROSCOPIC
Bilirubin Urine: NEGATIVE
Glucose, UA: NEGATIVE mg/dL
Specific Gravity, Urine: 1.019 (ref 1.005–1.030)
Urobilinogen, UA: 1 mg/dL (ref 0.0–1.0)
pH: 5 (ref 5.0–8.0)

## 2010-12-01 LAB — AMMONIA: Ammonia: 66 umol/L — ABNORMAL HIGH (ref 11–60)

## 2010-12-01 LAB — COMPREHENSIVE METABOLIC PANEL
ALT: 164 U/L — ABNORMAL HIGH (ref 0–53)
Albumin: 2.9 g/dL — ABNORMAL LOW (ref 3.5–5.2)
Calcium: 8.9 mg/dL (ref 8.4–10.5)
GFR calc Af Amer: >60 mL/min (ref >60)
Glucose, Bld: 170 mg/dL — ABNORMAL HIGH (ref 70–99)
Sodium: 138 mEq/L (ref 135–145)
Total Protein: 7.6 g/dL (ref 6.0–8.3)

## 2010-12-01 LAB — RAPID URINE DRUG SCREEN, HOSP PERFORMED
Amphetamines: NOT DETECTED
Cocaine: NOT DETECTED
Opiates: NOT DETECTED

## 2010-12-01 LAB — CK TOTAL AND CKMB (NOT AT ARMC)
CK, MB: 2 ng/mL (ref 0.3–4.0)
Total CK: 107 U/L (ref 7–232)

## 2010-12-01 LAB — CBC
HCT: 35.5 % — ABNORMAL LOW (ref 39.0–52.0)
Hemoglobin: 12.2 g/dL — ABNORMAL LOW (ref 13.0–17.0)
MCV: 93.4 fL (ref 78.0–100.0)
RBC: 3.8 MIL/uL — ABNORMAL LOW (ref 4.22–5.81)
WBC: 11.2 10*3/uL — ABNORMAL HIGH (ref 4.0–10.5)

## 2010-12-01 LAB — TROPONIN I: Troponin I: <0.3 ng/mL (ref <0.30)

## 2010-12-01 LAB — ACETAMINOPHEN LEVEL: Acetaminophen (Tylenol), Serum: <15 ug/mL (ref 10–30)

## 2010-12-01 NOTE — H&P (Signed)
Hospital Admission Note Date: 12/02/2010  Patient name: [name] Medical record number: [id number] Date of birth: 1935-03-27 Age: 76 y.o. Gender: male PCP: No primary provider on file.  Medical Service: Internal Medicine Teaching Service  Attending physician:  Dr. Rogelia Boga    Resident (R2/R3): Dr. Denton Meek    Pager: [phone] Resident (R1): Dr. Candy Sledge     Pager: [phone]  Chief Complaint: altered mental status  History of Present Illness: 76 year old man with a history of several prior admissions for altered mental status secondary to alcohol withdrawal and seizures presents from a friend's home with reported altered mental status change after an episode of drinking alcohol. Patient is uncommunicative so no further details about his presentation could be gathered. Patient lives alone in an assisted living facility and has children who have brought him to the hospital on prior admissions. While in the ED, the patient received 1L NS, 5mg  of metoprolol and 2mg  of Ativan IV and two doses of tylenol per rectum after failing an initial swallow evaluation.   Meds:  1. Phenytoin 100 mg p.o. 3 times a day.   2. Donepezil 10 mg p.o. q.h.s.   3. Clonidine 0.1 mg twice daily as needed for systolic greater than       180.   4. Folic acid 1 mg p.o. daily.   5. Furosemide 20 mg p.o. daily.   6. Metoprolol 25 mg p.o. twice daily.   7. Magnesium oxide 600 mg p.o. twice daily.   8. Multivitamin 1 tablet p.o. daily.   9. Thiamine 100 mg p.o. daily.   Allergies: No known drug allergies.  Past Medical History  Diagnosis Date  . Alcohol dependence     Associated with thrombocytopenia, anemia, elevated LFTs, hypomagnesemia  . Seizures     Related to withdrawal, unclear if epilepsy present also  . HTN (hypertension)   . Dementia     Vascular and alcohol related.   Past Surgical History  Procedure Date  . Orif femoral neck fracture w/ dhs    No family history on file.  History   Social History    . Marital Status: Single    Spouse Name: N/A    Number of Children: 2  . Years of Education: N/A   Occupational History  . truck driver [employer]   Social History Main Topics  . Smoking status: Not on file  . Smokeless tobacco: Not on file  . Alcohol Use: Yes     Beer is preference.  . Drug Use: No  . Sexually Active: Not on file   Social History Narrative   Lives in facility, divorced and has two children, in past was noted to check himself out and come back with altered mental status post alcohol binge.   Review of Systems: Unable to perform since patient does not directly answer questions.  Physical Exam: Vitals: T: 101.6   HR: 104   BP: 147/101   RR: 20  O2 saturation: 100% RA General: slim male resting in bed, does not keep eye contact and is shifting weight in bed HEENT: PERRL, extraocular motions grossly intact-patient has difficulty following finger, no nystagmus,scleral icterus present Cardiac: RRR, no rubs, murmurs or gallops Pulm: clear to auscultation bilaterally, moving normal volumes of air Abd: soft, nontender, nondistended, BS present Ext: warm and well perfused, no pedal edema Neuro: drowsy and disoriented, does not answer to his name, speaks quietly, cranial nerves II-XII grossly intact, unable to assess strength and sensation since patient  does not follow directions, tremor present  Lab results:   WBC                                      11.2       h      4.0-10.5         K/uL  RBC                                      3.80       l      4.22-5.81        MIL/uL  Hemoglobin (HGB)                         12.2       l      13.0-17.0        g/dL  Hematocrit (HCT)                         35.5       l      39.0-52.0        %  MCV                                      93.4              78.0-100.0       fL  MCH -                                    32.1              26.0-34.0        pg  MCHC                                     34.4              30.0-36.0         g/dL  RDW                                      13.1              11.5-15.5        %  Platelet Count (PLT)                     SEE NOTE.         150-400          K/uL    PLATELET CLUMPS NOTED ON SMEAR, UNABLE TO ESTIMATE   Sodium (NA)                              138               135-145          mEq/L  Potassium (K)  4.2               3.5-5.1          mEq/L  Chloride                                 100               96-112           mEq/L  CO2                                      18         l      19-32            mEq/L  Glucose                                  170        h      70-99            mg/dL  BUN                                      7                 6-23             mg/dL  Creatinine                               1.04              0.50-1.35        Mg/dL  GFR, Est African American                >60               >60              mL/min  Bilirubin, Total                         1.2               0.3-1.2          mg/dL  Alkaline Phosphatase                     136        h      39-117           U/L  SGOT (AST)                               326        h      0-37             U/L  SGPT (ALT)                               164        h  0-53             U/L  Total  Protein                           7.6               6.0-8.3          g/dL  Albumin-Blood                            2.9        l      3.5-5.2          g/dL  Calcium                                  8.9               8.4-10.5         Mg/dL Ammonia                                  66         h      11-60            Umol/L Salicylate                               <2.0       l      2.8-20.0         mg/dL Alcohol                                  <11               0-11             Mg/dL Acetominophen                     <15.0             10-30            ug/mL Ammonia                                  66         h      11-60            Umol/L Salicylate                               <2.0       l       2.8-20.0         mg/dL Alcohol                                  <11               0-11             Mg/dL Acetominophen                     <  15.0             10-30            ug/mL   Neutrophils, %                           89         h      43-77            %  Lymphocytes, %                           8          l      12-46            %  Monocytes, %                             3                 3-12             %  Eosinophils, %                           0                 0-5              %  Basophils, %                             0                 0-1              %  Neutrophils, Absolute                    10.0       h      1.7-7.7          K/uL  Lymphocytes, Absolute                    0.9               0.7-4.0          K/uL  Monocytes, Absolute                      0.3               0.1-1.0          K/uL  Eosinophils, Absolute                    0.0               0.0-0.7          K/uL  Basophils, Absolute                      0.0               0.0-0.1          K/uL Magnesium  1.7               1.5-2.5          Mg/dL Creatine Kinase, Total                   107               7-232            U/L CK, MB                                   2.0               0.3-4.0          ng/mL Relative Index                           1.9               0.0-2.5 Troponin I                               <0.30             <0.30            ng/mL Phenytoin                                <2.5       l      10.0-20.0        Ug/mL   Amphetamins                              SEE NOTE.         NDT    NONE DETECTED  Barbiturates                             SEE NOTE.         NDT    Oversized comment, see footnote  1  Benzodiazepines                          SEE NOTE.         NDT    NONE DETECTED  Cocaine                                  SEE NOTE.         NDT    NONE DETECTED  Opiates                                  SEE NOTE.         NDT    NONE DETECTED  Tetrahydrocannabinol                      SEE NOTE.         NDT    NONE DETECTED   Sodium (NA)  138               135-145          mEq/L  Potassium (K)                            4.2               3.5-5.1          mEq/L  Chloride                                 100               96-112           mEq/L  CO2                                      18         l      19-32            mEq/L  Glucose                                  170        h      70-99            mg/dL  BUN                                      7                 6-23             mg/dL  Creatinine                               1.04              0.50-1.35        Mg/dL  GFR, Est African American                >60               >60              mL/min  Bilirubin, Total                         1.2               0.3-1.2          mg/dL  Alkaline Phosphatase                     136        h      39-117           U/L  SGOT (AST)                               326        h      0-37  U/L  SGPT (ALT)                               164        h      0-53             U/L  Total  Protein                           7.6               6.0-8.3          g/dL  Albumin-Blood                            2.9        l      3.5-5.2          g/dL  Calcium                                  8.9               8.4-10.5         Mg/dL   Color, Urine                             AMBER      a      YELLOW  Appearance                               CLOUDY     a      CLEAR  Specific Gravity                         1.019             1.005-1.030  pH                                       5.0               5.0-8.0  Urine Glucose                            NEGATIVE          NEG              mg/dL  Bilirubin                                NEGATIVE          NEG  Ketones                                  15         a      NEG              mg/dL  Blood  MODERATE   a      NEG  Protein                                  30         a      NEG               mg/dL  Urobilinogen                             1.0               0.0-1.0          mg/dL  Nitrite                                  NEGATIVE          NEG  Leukocytes                               NEGATIVE          NEG   Bacteria / HPF                           FEW        a      RARE  Urine-Other                              SEE NOTE.    MUCOUS PRESENT    AMORPHOUS URATES/PHOSPHATES  Imaging results:  PORTABLE CHEST - 1 VIEW    IMPRESSION:   Rotated portable exam.  Limited evaluation of the right lung base.   Right base infiltrate/atelectasis not excluded.    Cardiomegaly.   Central pulmonary vascular prominence  CT HEAD WITHOUT CONTRAST    IMPRESSION:   Chronic microvascular ischemia.  No acute intracranial abnormality.  Other results: EKG: Sinus tachycardia, no other abnormalities noted.  Assessment & Plan by Problem: 1) Acute on Chronic AMS: Mr. Smick has baseline dementia due to chronic ischemic changes- likely alcohol and hypertension induced. He's not following commands and is agitated. Considering the history comes from ED, patient is likely having altered mental status due to alcoholism- more of withdrawal than intoxication (as alcohol levels are normal and has multiple past admissions with alcohol withdrawal seizures and VDRF). He is not seizing as of now.  - Will admit to step down unit. - Will start him on CIWA protocol with IV Ativan. - Will start on IV fluids maintenance until he passes swallow eval.  2) Leukocytosis:  2) History of seizure: Most likely secondary to alcohol withdrawal, seizure disorder not ruled out. Patient on Dilantin at home, levels not in therapeutic range (less than 2.5).  - Will start by mouth Dilantin once passes his swallow evaluation.  3) Leukocytosis: Likely secondary to stress reaction due to alcoholism. Chest x-ray portable view can not rule out right lower lobe infiltrate. - Discussed with radiologist and he is more inclined  towards rotation defect than infiltrate.  - So, will hold off antibiotics for now and repeat CXR if his respiratory status worsens and consider adding antibiotics if needed. -follow blood cultures X2  4)  Alcohol dependence: He was brought to the hospital from a friend's place presumably where he was drinking alcohol. As noted before he has multiple admissions with alcohol withdrawal. - patient has fever currently, treating with tylenol per rectum Q6 hours, likely due to withdrawal - His magnesium levels and CK levels normal. Also creatinine is normal and so rhabdomyolysis is not an issue for now. - We'll continue on CIWA protocol for now and follow closely.  - CSW consult  5) Hypertension: Will hold Lasix for now. - Will give hydralazine when necessary . - Will give IV metoprolol for tachycardia when necessary.   6) VTE prophylaxis: Lovenox 40 mg subcutaneously daily.   R2/3______________________________      R1________________________________  ATTENDING: I performed and/or observed a history and physical examination of the patient.  I discussed the case with the residents as noted and reviewed the residents' notes.  I agree with the findings and plan--please refer to the attending physician note for more details.  Signature________________________________  Printed Name_____________________________

## 2010-12-02 ENCOUNTER — Encounter: Payer: Self-pay | Admitting: Ophthalmology

## 2010-12-02 DIAGNOSIS — R4182 Altered mental status, unspecified: Secondary | ICD-10-CM

## 2010-12-02 LAB — BASIC METABOLIC PANEL
Calcium: 7.6 mg/dL — ABNORMAL LOW (ref 8.4–10.5)
Creatinine, Ser: 0.97 mg/dL (ref 0.50–1.35)
GFR calc Af Amer: >60 mL/min (ref >60)
GFR calc non Af Amer: >60 mL/min (ref >60)

## 2010-12-02 LAB — LACTIC ACID, PLASMA: Lactic Acid, Venous: 1.3 mmol/L (ref 0.5–2.2)

## 2010-12-02 LAB — CBC
MCH: 32.2 pg (ref 26.0–34.0)
MCV: 95 fL (ref 78.0–100.0)
Platelets: 82 10*3/uL — ABNORMAL LOW (ref 150–400)
RDW: 13.2 % (ref 11.5–15.5)
WBC: 11.5 10*3/uL — ABNORMAL HIGH (ref 4.0–10.5)

## 2010-12-02 LAB — PROTIME-INR: Prothrombin Time: 15.4 seconds — ABNORMAL HIGH (ref 11.6–15.2)

## 2010-12-03 LAB — COMPREHENSIVE METABOLIC PANEL
ALT: 95 U/L — ABNORMAL HIGH (ref 0–53)
Albumin: 2.5 g/dL — ABNORMAL LOW (ref 3.5–5.2)
Alkaline Phosphatase: 89 U/L (ref 39–117)
GFR calc non Af Amer: >60 mL/min (ref >60)
Potassium: 3.6 mEq/L (ref 3.5–5.1)
Sodium: 135 mEq/L (ref 135–145)
Total Protein: 6.1 g/dL (ref 6.0–8.3)

## 2010-12-03 LAB — CBC
Hemoglobin: 10.2 g/dL — ABNORMAL LOW (ref 13.0–17.0)
MCHC: 34.3 g/dL (ref 30.0–36.0)
RDW: 13.2 % (ref 11.5–15.5)
WBC: 12.1 10*3/uL — ABNORMAL HIGH (ref 4.0–10.5)

## 2010-12-04 LAB — CBC
Hemoglobin: 9.6 g/dL — ABNORMAL LOW (ref 13.0–17.0)
MCH: 32 pg (ref 26.0–34.0)
Platelets: 83 10*3/uL — ABNORMAL LOW (ref 150–400)

## 2010-12-05 DIAGNOSIS — R4182 Altered mental status, unspecified: Secondary | ICD-10-CM

## 2010-12-07 NOTE — Discharge Summary (Signed)
Pt was admitted for AMS, was ETOH withdrawal, restarted on phenytoin for seizure disorder ppx. Went to SNF for rehab will transition back to ALF. Please recheck functional status and re-address long term HCPOA and code status for future.

## 2010-12-08 LAB — CULTURE, BLOOD (ROUTINE X 2)
Culture  Setup Time: 201207200454
Culture: NO GROWTH
Culture: NO GROWTH

## 2010-12-15 NOTE — Discharge Summary (Signed)
[name], [name] NO.:  [id number]  MEDICAL RECORD NO.:  [id number]  LOCATION:  4501                         FACILITY:  [facility]  PHYSICIAN:  Blanch Media, M.D.DATE OF BIRTH:  1934-05-28  DATE OF ADMISSION:  12/01/2010 DATE OF DISCHARGE:  12/06/2010                              DISCHARGE SUMMARY   DISCHARGE DIAGNOSES: 1. Altered mental status. 2. Alcohol abuse. 3. Leukocytosis. 4. Hypertension. 5. Malnutrition.  DISCHARGE MEDICATIONS: 1. Ensure chocolate liquid 1 can by mouth three times a day before     meals. 2. Phenytoin 100 mg by mouth three times a day. 3. Seroquel 50 mg tablet by mouth daily at bedtime as needed for     agitation. 4. Aricept 10 mg 1 tablet by mouth daily at bedtime. 5. Folic acid 1 mg 1 tablet by mouth daily. 6. Magnesium oxide 600 mg 1 tablet by mouth twice daily. 7. Metoprolol tartrate 25 mg 1 tablet by mouth twice daily. 8. Multivitamin one by mouth daily. 9. Thiamine and vitamin B1 100 mg 1 tablet by mouth daily.  DISPOSITION AND FOLLOWUP:  He will follow up at our outpatient Clinic. He will go today at skilled nursing facility for rehab and then possibly back to his ALF to live in the future depending on his needs.  So, please follow up on his functional capacity at that time. Please re-check a phenytoin level to ensure that he is not supra-therapeutic.  PROCEDURES PERFORMED:  None.  CONSULTATIONS:  None.  ADMITTING HISTORY AND PHYSICAL:  This is a 76 year old male with a history of prior admission for altered mental status secondary to alcohol withdrawal and seizure presenting from a friend's home with reported altered mental status change after an episode of drinking alcohol.  The patient is noncommunicative, so no further details about presentation could be gathered.  The patient does live alone in an assisted living facility and does have children who have brought him to the hospital on prior admission.  While  in the emergency department, he did receive 1 liter of normal saline, 5 mg of metoprolol and 2 mg of Ativan.  He did also get 2 doses of Tylenol per rectum after failing an initial swallow evaluation.  CURRENT HOME MEDICATIONS:  At time of presentation, 1. Phenytoin 100 mg p.o. 3 times daily. Not taking. 2. Aricept 10 mg p.o. at bedtime. 3. Clonidine 0.1 mg twice daily as needed for systolic greater than     180. 4. Folic acid 1 mg p.o. daily. 5. Furosemide 20 mg p.o. daily. 6. Metoprolol 25 mg p.o. twice daily. 7. Magnesium oxide 600 mg p.o. twice daily. 8. Multivitamin 1 tablet p.o. daily. 9. Thiamine 100 mg p.o. daily.  ALLERGIES:  No known.  PAST MEDICAL HISTORY: 1. Alcohol dependence. 2. Seizures. 3. Hypertension. 4. Vascular and alcohol dementia.  PHYSICAL EXAMINATION:  VITAL SIGNS:  Temperature 101.6, heart rate of 104, BP 147/101, respirations were 20, and O2 saturation was 100%. GENERAL:  He was a slim male who is resting in bed, does not eye contacts, shifting his weight in bed. HEENT:  Pupils were equal, round, reactive to light and accommodation. Extraocular motions grossly intact.  The patient has difficulty following finger.  No nystagmus.  Scleral icterus is present. CARDIAC:  Regular rate and rhythm.  No rubs, murmurs, or gallops. PULMONARY:  Clear to auscultation bilaterally.  Moving normal volumes of air. ABDOMEN:  Soft, nontender, nondistended.  Bowel sounds were present. EXTREMITIES:  Warm and well perfused.  No pedal edema. NEUROLOGICALLY:  He is drowsy and disoriented, does not answer to his name, speaks quietly.  Cranial nerves are grossly intact.  Unable to assess strength and sensation and the patient does not follow directions.  There is a tremor present.  LAB RESULTS:  At the time of admission, sodium is 138, potassium 4.2, chloride 100, bicarb is 18, BUN 7, creatinine 1.04, glucose was 170, total bilirubin is 1.2, alk phos is 136, AST is 326,  ALT is 164, total protein 7.6, albumin is 2.9, calcium is 8.9, ammonia is 66, which is high slightly, his  salicylate is  low at less than 2, alcohol was less than 11 low, acetaminophen is low less than 15.  White blood count is 11.2.  His hemoglobin is 12.2 and platelet count is clumped on the smear and  they were unable to estimate.  Urine drug screen was negative.  A urinalysis did show moderate blood and few bacteria, but no nitrates or leukocytes.  IMAGING RESULTS:  There was a portable chest that did show limited evaluation of the right lung base.  Right base  infiltrate atelectasis not excluded.  Cardiomegaly, a central pulmonary vascular prominence. There was a CT head done without contrast that did show chronic microvascular ischemia.  No acute intracranial abnormality.  There was an EKG that showed sinus tachycardia, no other abnormalities noted.  HOSPITAL COURSE BY PROBLEMS: 1. Acute-on-chronic altered mental status.  Mr. Kloosterman has baseline     dementia due to chronic ischemic changes due to vascular as well as     EtOH likely due to his hypertension as well as alcohol use, so at     baseline he is not really following commands and is agitated     considering history it is likely due to his alcoholism more     withdrawals, and intoxication of alcohol levels are normal and they     are multiple past admissions for the same problem.  He is not     seizing as of now, so he  was admitted to step-down unit with CIWA     protocol and he was on IV maintenance fluid and kept n.p.o. until     he was able to be awake enough to swallow, so he did this the     following morning on recovering and was awake enough to swallow     and was oriented x2.  He was not oriented to time; however, this is     his baseline with his dementia. He did become A and O x 2 and was stable to be moved to a regular medical floor. We did consult social work as far     as placement, so he was decided to go to a  skilled nursing facility     as this was more appropriate as he was not able to mentate enough     to manage his home medications as well as activities of daily     living.  He did remain at this baseline and did start taking an     liquids.  He was not keeping that much away p.o., but he was  continuing liquids, so he was considered stable for discharge to     skilled nursing facility at this time. 2. Alcohol dependence, so he was presumably drinking alcohol before     this admission.  He did also multiple admissions prior, we did have     social worker come and talk to him about stopping the magnesium     level, CK levels were normal.  Creatinine is also normal.  We did     continue him on CIWA.  He was not referring to allow by the end of     the admission and he was stable for discharge in regard to this     problem. 3. Leukocytosis likely secondary to stress reaction due to the     alcoholism.  No focal signs of infection.  Chest x-ray was clear.     Urine was clean.  He was asymptomatic throughout this admission and     his white blood count did normalize.  Throughout his admission, he     was stable for discharge.  We did get blood cultures, which did not     grow anything. 4. Hypertension.  We did hold his Lasix and we did have p.r.n.     hydralazine as needed if that was needed for his blood pressure.     He did not require this during this admission and was stable for     discharge in regard to this problem. 5. Malnutrition.  His albumin was 2.5 during this admission, so we did     start Ensure for him 3 times a day and we will try to encourage     p.o. intake as well.  We did repeat his thiamine and folate, so the     patient was stable for discharge to the skilled nursing facility at     this time.  VITALS ON THE DAY OF DISCHARGE:  His temperature was 98.9, pulse was 85, respirations 20, blood pressure was 124/79, and O2 saturating 99% on room air resolved since  admission.  There was a CBC that was drawn and did show white count of 8.58, hemoglobin of 9.6, platelet count 83, which was stable throughout this admission.  Blood cultures x2 were drawn that showed no growth at 3 and 4 days respectively.  There was a pneumonia that was drawn that was slightly high at 63, but was trending down from his baseline.  He did not have any symptoms. There was a BMET did showed a sodium of 135, potassium 3.6, chloride 101, bicarb of 21, BUN of 6, creatinine 0.77, and glucose of 80, total bilirubin 1.5, AST 140, ALT 95, alk phos 89, total protein 6.1, albumin 2.5, calcium 7.6.  A PTT that was done that was normal.  There was a PT/INR that was done that was normal.  A lactic acid that was drawn that was normal versus screening  PCR that was negative.  A magnesium was drawn that was normal.  A Dilantin level was drawn on admission that was very low as 2.5 normal at 10-20.  There was an additional chest x-ray that was done that did find some subsegmental atelectasis in the right middle lobe, one thing, posterior focus small pleural effusion not excluded.  No segmental consolidation, cardiomegaly, mild tortuous aorta, mild loss of height superior endplate T10,  anterior wedge compression deformity L2 , these are age  determined, but no acute pneumonia found.  At this point, the patient was stable for  discharge to skilled nursing facility.    ______________________________ Genella Mech, MD   ______________________________ Blanch Media, M.D.    EK/MEDQ  D:  12/05/2010  T:  12/06/2010  Job:  [id number]  Electronically Signed by Genella Mech MD on 12/11/2010 09:25:50 AM Electronically Signed by Blanch Media M.D. on 12/15/2010 09:35:47 AM

## 2011-02-07 LAB — BASIC METABOLIC PANEL
BUN: 10
BUN: 8
BUN: 9
CO2: 20
CO2: 25
CO2: 26
Calcium: 7.4 — ABNORMAL LOW
Calcium: 7.4 — ABNORMAL LOW
Calcium: 7.7 — ABNORMAL LOW
Creatinine, Ser: 0.98
Creatinine, Ser: 1.06
GFR calc Af Amer: >60
GFR calc Af Amer: >60
GFR calc non Af Amer: >60
GFR calc non Af Amer: >60
Glucose, Bld: 105 — ABNORMAL HIGH
Glucose, Bld: 112 — ABNORMAL HIGH
Glucose, Bld: 135 — ABNORMAL HIGH
Potassium: 3.8
Potassium: 4.2
Potassium: 4.9
Sodium: 135
Sodium: 137

## 2011-02-07 LAB — CBC
HCT: 21.5 — ABNORMAL LOW
HCT: 28.4 — ABNORMAL LOW
HCT: 31.5 — ABNORMAL LOW
HCT: 31.9 — ABNORMAL LOW
Hemoglobin: 11 — ABNORMAL LOW
Hemoglobin: 7.6 — CL
MCHC: 34.4
MCHC: 35.4
MCV: 92.7
MCV: 92.8
Platelets: 39 — CL
Platelets: 47 — CL
Platelets: 65 — ABNORMAL LOW
Platelets: 78 — ABNORMAL LOW
RBC: 2.76 — ABNORMAL LOW
RBC: 3.43 — ABNORMAL LOW
RDW: 14.3
RDW: 14.3
RDW: 14.7

## 2011-02-07 LAB — ABO/RH: ABO/RH(D): O POS

## 2011-02-07 LAB — DIFFERENTIAL
Basophils Relative: 1
Lymphocytes Relative: 33
Lymphs Abs: 2.9
Monocytes Absolute: 0.4
Monocytes Relative: 5
Neutro Abs: 5.6

## 2011-02-07 LAB — TYPE AND SCREEN
ABO/RH(D): O POS
Antibody Screen: NEGATIVE

## 2011-02-07 LAB — POCT I-STAT 4, (NA,K, GLUC, HGB,HCT)
Glucose, Bld: 113 — ABNORMAL HIGH
Operator id: 247171

## 2011-02-07 LAB — PREPARE RBC (CROSSMATCH)

## 2011-02-07 LAB — URINALYSIS, ROUTINE W REFLEX MICROSCOPIC
Bilirubin Urine: NEGATIVE
Nitrite: NEGATIVE
Protein, ur: NEGATIVE
Specific Gravity, Urine: 1.006
Urobilinogen, UA: 1

## 2011-02-07 LAB — COMPREHENSIVE METABOLIC PANEL
Albumin: 2.9 — ABNORMAL LOW
Alkaline Phosphatase: 89
BUN: 13
Potassium: 4.7
Total Protein: 6.8

## 2011-02-07 LAB — ETHANOL: Alcohol, Ethyl (B): 280 — ABNORMAL HIGH

## 2011-02-21 LAB — DIFFERENTIAL
Eosinophils Absolute: 0.1 — ABNORMAL LOW
Eosinophils Relative: 1
Lymphs Abs: 3.3
Monocytes Relative: 8

## 2011-02-21 LAB — CBC
MCHC: 32.6
RBC: 2.57 — ABNORMAL LOW

## 2011-02-21 LAB — COMPREHENSIVE METABOLIC PANEL
ALT: 35
AST: 52 — ABNORMAL HIGH
CO2: 26
Calcium: 7.8 — ABNORMAL LOW
GFR calc Af Amer: >60
GFR calc non Af Amer: >60
Potassium: 3.9
Sodium: 137
Total Protein: 6

## 2011-02-21 LAB — ETHANOL: Alcohol, Ethyl (B): 303 — ABNORMAL HIGH

## 2011-03-20 ENCOUNTER — Emergency Department (HOSPITAL_COMMUNITY)
Admission: EM | Admit: 2011-03-20 | Discharge: 2011-03-20 | Disposition: A | Discharge status: Home / Self Care | Payer: Medicare Other | Attending: Emergency Medicine | Admitting: Emergency Medicine

## 2011-03-20 ENCOUNTER — Encounter (HOSPITAL_COMMUNITY): Payer: Self-pay | Admitting: Emergency Medicine

## 2011-03-20 ENCOUNTER — Other Ambulatory Visit: Payer: Self-pay

## 2011-03-20 ENCOUNTER — Emergency Department (HOSPITAL_COMMUNITY): Payer: Medicare Other

## 2011-03-20 DIAGNOSIS — R5383 Other fatigue: Secondary | ICD-10-CM | POA: Insufficient documentation

## 2011-03-20 DIAGNOSIS — F0390 Unspecified dementia without behavioral disturbance: Secondary | ICD-10-CM | POA: Insufficient documentation

## 2011-03-20 DIAGNOSIS — Z91199 Patient's noncompliance with other medical treatment and regimen due to unspecified reason: Secondary | ICD-10-CM | POA: Insufficient documentation

## 2011-03-20 DIAGNOSIS — F29 Unspecified psychosis not due to a substance or known physiological condition: Secondary | ICD-10-CM | POA: Insufficient documentation

## 2011-03-20 DIAGNOSIS — I10 Essential (primary) hypertension: Secondary | ICD-10-CM | POA: Insufficient documentation

## 2011-03-20 DIAGNOSIS — G40909 Epilepsy, unspecified, not intractable, without status epilepticus: Secondary | ICD-10-CM

## 2011-03-20 DIAGNOSIS — R5381 Other malaise: Secondary | ICD-10-CM | POA: Insufficient documentation

## 2011-03-20 DIAGNOSIS — Z9119 Patient's noncompliance with other medical treatment and regimen: Secondary | ICD-10-CM | POA: Insufficient documentation

## 2011-03-20 DIAGNOSIS — Z9114 Patient's other noncompliance with medication regimen: Secondary | ICD-10-CM

## 2011-03-20 DIAGNOSIS — R569 Unspecified convulsions: Secondary | ICD-10-CM

## 2011-03-20 DIAGNOSIS — R404 Transient alteration of awareness: Secondary | ICD-10-CM | POA: Insufficient documentation

## 2011-03-20 LAB — COMPREHENSIVE METABOLIC PANEL
BUN: 23 mg/dL (ref 6–23)
Calcium: 9.3 mg/dL (ref 8.4–10.5)
GFR calc Af Amer: 52 mL/min — ABNORMAL LOW (ref >90)
Glucose, Bld: 156 mg/dL — ABNORMAL HIGH (ref 70–99)
Total Protein: 7.9 g/dL (ref 6.0–8.3)

## 2011-03-20 LAB — CBC
MCH: 30 pg (ref 26.0–34.0)
MCV: 89.2 fL (ref 78.0–100.0)
Platelets: 139 10*3/uL — ABNORMAL LOW (ref 150–400)
RDW: 13.7 % (ref 11.5–15.5)

## 2011-03-20 LAB — RAPID URINE DRUG SCREEN, HOSP PERFORMED
Amphetamines: NOT DETECTED
Benzodiazepines: NOT DETECTED
Opiates: NOT DETECTED

## 2011-03-20 LAB — URINALYSIS, ROUTINE W REFLEX MICROSCOPIC
Bilirubin Urine: NEGATIVE
Nitrite: NEGATIVE
Specific Gravity, Urine: 1.019 (ref 1.005–1.030)
pH: 5.5 (ref 5.0–8.0)

## 2011-03-20 LAB — DIFFERENTIAL
Basophils Absolute: 0 10*3/uL (ref 0.0–0.1)
Basophils Relative: 0 % (ref 0–1)
Eosinophils Absolute: 0 10*3/uL (ref 0.0–0.7)
Eosinophils Relative: 0 % (ref 0–5)

## 2011-03-20 LAB — URINE MICROSCOPIC-ADD ON

## 2011-03-20 MED ORDER — PHENYTOIN SODIUM EXTENDED 100 MG PO CAPS: 300.0000 mg | ORAL_CAPSULE | Freq: Every day | ORAL | Status: DC

## 2011-03-20 MED ORDER — SODIUM CHLORIDE 0.9 % IV SOLN
1000.0000 mg | Freq: Once | INTRAVENOUS | Status: AC
  Administered 2011-03-20: 1000 mg via INTRAVENOUS
  Filled 2011-03-20: qty 20

## 2011-03-20 NOTE — ED Provider Notes (Addendum)
History     CSN: [id number] Arrival date & time: 03/20/2011  1:25 AM   First MD Initiated Contact with Patient 03/20/11 0146      Chief Complaint  Patient presents with  . Seizures    unknown onset time family member called to check on pt found in bed incont bowel & bladder    (Consider location/radiation/quality/duration/timing/severity/associated sxs/prior treatment) HPI Comments: The patient presents for evaluation of seizure. The patient's family found him at home, incontinent of urine and stool, and a post ictal state that they recognized as having followed a seizure. The seizure was not witnessed. The patient has a history of seizure disorder, dementia, and alcohol abuse. At present the patient is in post ictal delirium, awake but lethargic, and simply states "I don't feel too good." He has no evident focal neurologic deficits.   Patient is a 76 y.o. male presenting with seizures. The history is provided by medical records and the EMS personnel. The history is limited by the condition of the patient (Postictal delirium).  Seizures  This is a recurrent problem. The current episode started 1 to 2 hours ago. The problem has been resolved. Number of times: Unknown. Duration: Unknown duration. Associated symptoms include sleepiness and confusion. Pertinent negatives include no headaches, no speech difficulty, no neck stiffness, no sore throat, no chest pain, no cough, no nausea, no vomiting and no muscle weakness. The seizure was unwitnessed The episode was not witnessed. Aura: Unknown. The seizures did not continue in the ED. Possible causes include missed seizure meds. There has been no fever. There were no medications administered prior to arrival.    Past Medical History  Diagnosis Date  . Alcohol dependence     Associated with thrombocytopenia, anemia, elevated LFTs, hypomagnesemia  . Seizures     Related to withdrawal, unclear if epilepsy present also  . HTN (hypertension)   .  Dementia     Vascular and alcohol related.    Past Surgical History  Procedure Date  . Orif femoral neck fracture w/ dhs     History reviewed. No pertinent family history.  History  Substance Use Topics  . Smoking status: Current Everyday Smoker -- 1.0 packs/day  . Smokeless tobacco: Never Used  . Alcohol Use: Yes     Beer is preference.      Review of Systems  Unable to perform ROS: Mental status change  HENT: Negative for sore throat.   Respiratory: Negative for cough.   Cardiovascular: Negative for chest pain.  Gastrointestinal: Negative for nausea and vomiting.  Neurological: Positive for seizures. Negative for speech difficulty and headaches.  Psychiatric/Behavioral: Positive for confusion.    Allergies  Review of patient's allergies indicates no known allergies.  Home Medications   Current Outpatient Rx  Name Route Sig Dispense Refill  . CILOSTAZOL 100 MG PO TABS Oral Take 100 mg by mouth 2 (two) times daily.      Marland Kitchen CLONIDINE HCL 0.1 MG PO TABS Oral Take 0.1 mg by mouth daily as needed. For systolic blood pressure greater than 180     . DONEPEZIL HCL 10 MG PO TABS Oral Take 10 mg by mouth at bedtime as needed.      Marland Kitchen FOLIC ACID 1 MG PO TABS Oral Take 1 mg by mouth daily.     . FUROSEMIDE 20 MG PO TABS Oral Take 20 mg by mouth 2 (two) times daily as needed. For swelling     . MAGNESIUM OXIDE 600 MG PO CAPS  Oral Take 1 capsule by mouth 2 (two) times daily.     Marland Kitchen METOPROLOL TARTRATE 25 MG PO TABS Oral Take 25 mg by mouth 2 (two) times daily.      . MULTIVITAMINS PO TABS Oral Take 1 tablet by mouth daily.      Marland Kitchen NAPROXEN 500 MG PO TBEC Oral Take 500 mg by mouth 2 (two) times daily as needed. For pain     . ENSURE NUTRA SHAKE HI-CAL PO Oral Take 1 Can by mouth 3 (three) times daily.      Marland Kitchen PHENYTOIN SODIUM EXTENDED 100 MG PO CAPS Oral Take 100 mg by mouth 3 (three) times daily.      Marland Kitchen PHENYTOIN SODIUM EXTENDED 100 MG PO CAPS Oral Take 3 capsules (300 mg total) by  mouth daily. 90 capsule 0  . QUETIAPINE FUMARATE 50 MG PO TABS Oral Take 50 mg by mouth at bedtime.      . THIAMINE HCL 100 MG PO TABS Oral Take 100 mg by mouth daily.        BP 139/77  Pulse 107  Temp(Src) 99.3 F (37.4 C) (Oral)  Resp 17  SpO2 99%  Physical Exam  Nursing note and vitals reviewed. Constitutional: He appears well-nourished. He appears lethargic. No distress.  HENT:  Head: Normocephalic and atraumatic.  Mouth/Throat: Oropharynx is clear and moist.  Eyes: Conjunctivae and EOM are normal. Pupils are equal, round, and reactive to light. No scleral icterus.  Neck: Normal range of motion. Neck supple. No JVD present. No tracheal deviation present.  Cardiovascular: Normal rate, regular rhythm, normal heart sounds and intact distal pulses.  Exam reveals no gallop and no friction rub.   No murmur heard. Pulmonary/Chest: Effort normal and breath sounds normal. No stridor. No respiratory distress. He has no wheezes. He has no rales. He exhibits no tenderness.  Abdominal: Soft. Bowel sounds are normal. He exhibits no distension. There is no tenderness. There is no rebound and no guarding.  Musculoskeletal: Normal range of motion. He exhibits no edema and no tenderness.  Neurological: He has normal strength and normal reflexes. He appears lethargic. He is disoriented. He displays no tremor. No cranial nerve deficit or sensory deficit. He exhibits normal muscle tone. He displays no seizure activity. Coordination normal. GCS eye subscore is 4. GCS verbal subscore is 4. GCS motor subscore is 6.  Skin: Skin is warm and dry. No rash noted. He is not diaphoretic. No erythema.  Psychiatric:       The patient is lethargic in a postictal state    ED Course  Procedures (including critical care time)  Date: 03/20/2011  Rate: 109  Rhythm: sinus tachycardia  QRS Axis: normal  Intervals: normal  ST/T Wave abnormalities: normal  Conduction Disutrbances:none  Narrative Interpretation:  non-provocative ecg  Old EKG Reviewed: unchanged   Labs Reviewed  COMPREHENSIVE METABOLIC PANEL - Abnormal; Notable for the following:    Sodium 133 (*)    Chloride 94 (*)    CO2 15 (*)    Glucose, Bld 156 (*)    Creatinine, Ser 1.49 (*)    Albumin 3.2 (*)    AST 196 (*) SLIGHT HEMOLYSIS   ALT 117 (*)    GFR calc non Af Amer 44 (*)    GFR calc Af Amer 52 (*)    All other components within normal limits  PHENYTOIN LEVEL, TOTAL - Abnormal; Notable for the following:    Phenytoin Lvl <2.5 (*)    All other  components within normal limits  URINALYSIS, ROUTINE W REFLEX MICROSCOPIC - Abnormal; Notable for the following:    Appearance CLOUDY (*)    Hgb urine dipstick SMALL (*)    Ketones, ur 15 (*)    Protein, ur 30 (*)    Leukocytes, UA SMALL (*)    All other components within normal limits  GLUCOSE, CAPILLARY - Abnormal; Notable for the following:    Glucose-Capillary 164 (*)    All other components within normal limits  CBC - Abnormal; Notable for the following:    WBC 13.9 (*)    RBC 4.06 (*)    Hemoglobin 12.2 (*)    HCT 36.2 (*)    Platelets 139 (*)    All other components within normal limits  DIFFERENTIAL - Abnormal; Notable for the following:    Neutrophils Relative 78 (*)    Neutro Abs 10.8 (*)    All other components within normal limits  URINE MICROSCOPIC-ADD ON - Abnormal; Notable for the following:    Squamous Epithelial / LPF MANY (*)    Bacteria, UA FEW (*)    Casts HYALINE CASTS (*)    All other components within normal limits  URINE RAPID DRUG SCREEN (HOSP PERFORMED)  ETHANOL  POCT CBG MONITORING   Ct Head Wo Contrast  03/20/2011  *RADIOLOGY REPORT*  Clinical Data: Seizure, confusion.  CT HEAD WITHOUT CONTRAST  Technique:  Contiguous axial images were obtained from the base of the skull through the vertex without contrast.  Comparison: 09/16/2009  Findings: Several images are degraded by patient motion. Prominence of the sulci, cisterns, and ventricles, in  keeping with volume loss. There are subcortical and periventricular white matter hypodensities, a nonspecific finding most often seen with chronic microangiopathic changes.  There is no evidence for acute hemorrhage, overt hydrocephalus, mass lesion, or abnormal extra-axial fluid collection.  No definite CT evidence for acute cortical based (large artery) infarction. Evidence of a prior left medial orbital wall fracture. The visualized paranasal sinuses and mastoid air cells are predominately clear.  No acute calvarial abnormality identified.  IMPRESSION: White matter hypodensities are nonspecific however most in keeping with chronic microangiopathic change.  No definite acute intracranial abnormality.  Original Report Authenticated By: Waneta Martins, M.D.     1. Seizure   2. Seizure disorder   3. Noncompliance with medication regimen       MDM  Seizure with seizure disorder and postictal state, medication noncompliance, alcohol withdrawal, drug toxicity, electrolyte abnormality, arrhythmia, brain tumor, closed head injury are all considered in the patient's differential diagnosis of seizure. Because the patient had a prolonged postictal period, a head CT was ordered and was negative for acute intracranial pathology. The patient was found to have a subtherapeutic Dilantin level and has been loaded with Dilantin at this time. He is awake, alert, and oriented at this time in stable for discharge home.        Felisa Bonier, MD 03/20/11 1610  Felisa Bonier, MD 03/20/11 [phone]

## 2011-03-20 NOTE — ED Notes (Signed)
Pt from home found by family incont bowel and bladder and non-responsive on EMS arrival appeared to be i  postictal state

## 2014-07-22 ENCOUNTER — Encounter: Payer: Self-pay | Admitting: Gastroenterology

## 2014-08-20 ENCOUNTER — Telehealth: Payer: Self-pay | Admitting: *Deleted

## 2014-08-20 NOTE — Telephone Encounter (Signed)
Spoke with executor and care taker, patient is not currently on blood thinners, he is only taking medicine for Alzheimer's and pain medication. Previsit nurse appointment to be kept 08/24/14 with probably direct colon per Dr. Fuller Plan if no significant health history.

## 2014-08-20 NOTE — Telephone Encounter (Signed)
Reviewing chart for previsit appointment Monday 08/24/14. Epic shows patient on blood thinner, [facility] who made appointment for patient denies blood thinners. Phone call to patient to confirm yes or no on blood thinner. If yes, patient will need an office visit. Discussed patient and need for OV with Dr. Fuller Plan as patient is 80 y/o, hasn't had GI care in 20+ years, Dr. Fuller Plan informed of last hospitalizations in 2012 and reviewed records from [facility], states patient okay for direct if no significant medical history brought forth during previsit.

## 2014-08-24 ENCOUNTER — Ambulatory Visit (AMBULATORY_SURGERY_CENTER): Payer: Self-pay | Admitting: *Deleted

## 2014-08-24 VITALS — Ht 64.0 in | Wt 115.8 lb

## 2014-08-24 DIAGNOSIS — Z1211 Encounter for screening for malignant neoplasm of colon: Secondary | ICD-10-CM

## 2014-08-24 MED ORDER — NA SULFATE-K SULFATE-MG SULF 17.5-3.13-1.6 GM/177ML PO SOLN: 1.0000 | Freq: Once | ORAL | Status: DC

## 2014-08-24 NOTE — Progress Notes (Signed)
No egg or soy allergy No home 02  No diet pills No issues with past sedation emmi video declined  Pt with dementia and here with his care partner

## 2014-09-07 ENCOUNTER — Other Ambulatory Visit (INDEPENDENT_AMBULATORY_CARE_PROVIDER_SITE_OTHER): Payer: Medicare Other

## 2014-09-07 ENCOUNTER — Other Ambulatory Visit: Payer: Self-pay

## 2014-09-07 ENCOUNTER — Encounter: Payer: Self-pay | Admitting: Gastroenterology

## 2014-09-07 ENCOUNTER — Telehealth: Payer: Self-pay

## 2014-09-07 ENCOUNTER — Ambulatory Visit (AMBULATORY_SURGERY_CENTER): Payer: Medicare Other | Admitting: Gastroenterology

## 2014-09-07 VITALS — BP 156/76 | HR 72 | Temp 97.1°F | Resp 16 | Ht 64.0 in | Wt 115.0 lb

## 2014-09-07 DIAGNOSIS — D123 Benign neoplasm of transverse colon: Secondary | ICD-10-CM

## 2014-09-07 DIAGNOSIS — Z1211 Encounter for screening for malignant neoplasm of colon: Secondary | ICD-10-CM

## 2014-09-07 DIAGNOSIS — D122 Benign neoplasm of ascending colon: Secondary | ICD-10-CM

## 2014-09-07 DIAGNOSIS — Z79899 Other long term (current) drug therapy: Secondary | ICD-10-CM | POA: Diagnosis not present

## 2014-09-07 DIAGNOSIS — C189 Malignant neoplasm of colon, unspecified: Secondary | ICD-10-CM

## 2014-09-07 DIAGNOSIS — Z5181 Encounter for therapeutic drug level monitoring: Secondary | ICD-10-CM

## 2014-09-07 DIAGNOSIS — K6389 Other specified diseases of intestine: Secondary | ICD-10-CM

## 2014-09-07 LAB — CBC WITH DIFFERENTIAL/PLATELET
Basophils Absolute: 0.1 10*3/uL (ref 0.0–0.1)
Basophils Relative: 0.9 % (ref 0.0–3.0)
EOS ABS: 0 10*3/uL (ref 0.0–0.7)
EOS PCT: 0.8 % (ref 0.0–5.0)
HCT: 39.1 % (ref 39.0–52.0)
HEMOGLOBIN: 12.7 g/dL — AB (ref 13.0–17.0)
LYMPHS ABS: 2.7 10*3/uL (ref 0.7–4.0)
LYMPHS PCT: 46.1 % — AB (ref 12.0–46.0)
MCHC: 32.4 g/dL (ref 30.0–36.0)
MCV: 93.8 fl (ref 78.0–100.0)
MONOS PCT: 6.5 % (ref 3.0–12.0)
Monocytes Absolute: 0.4 10*3/uL (ref 0.1–1.0)
NEUTROS ABS: 2.6 10*3/uL (ref 1.4–7.7)
Neutrophils Relative %: 45.7 % (ref 43.0–77.0)
PLATELETS: 124 10*3/uL — AB (ref 150.0–400.0)
RBC: 4.17 Mil/uL — AB (ref 4.22–5.81)
RDW: 14.1 % (ref 11.5–15.5)
WBC: 5.8 10*3/uL (ref 4.0–10.5)

## 2014-09-07 LAB — PROTIME-INR
INR: 1 ratio (ref 0.8–1.0)
Prothrombin Time: 10.8 s (ref 9.6–13.1)

## 2014-09-07 LAB — COMPREHENSIVE METABOLIC PANEL
ALBUMIN: 3.8 g/dL (ref 3.5–5.2)
ALK PHOS: 81 U/L (ref 39–117)
ALT: 24 U/L (ref 0–53)
AST: 33 U/L (ref 0–37)
BILIRUBIN TOTAL: 0.8 mg/dL (ref 0.2–1.2)
BUN: 19 mg/dL (ref 6–23)
CHLORIDE: 106 meq/L (ref 96–112)
CO2: 21 mEq/L (ref 19–32)
Calcium: 9.2 mg/dL (ref 8.4–10.5)
Creatinine, Ser: 0.79 mg/dL (ref 0.40–1.50)
GFR: 121.9 mL/min (ref >60.00)
Glucose, Bld: 88 mg/dL (ref 70–99)
POTASSIUM: 4.5 meq/L (ref 3.5–5.1)
Sodium: 137 mEq/L (ref 135–145)
TOTAL PROTEIN: 7.8 g/dL (ref 6.0–8.3)

## 2014-09-07 LAB — APTT: aPTT: 33.1 s — ABNORMAL HIGH (ref 23.4–32.7)

## 2014-09-07 LAB — TSH: TSH: 0.87 u[IU]/mL (ref 0.35–4.50)

## 2014-09-07 MED ORDER — SODIUM CHLORIDE 0.9 % IV SOLN: 500.0000 mL | INTRAVENOUS | Status: DC

## 2014-09-07 NOTE — Op Note (Signed)
[facility]  [address] [place], [address]   COLONOSCOPY PROCEDURE REPORT PATIENT: [name], [name]  MR#: [id number] BIRTHDATE: [birth year] , 39  yrs. old GENDER: male ENDOSCOPIST: Ladene Artist, MD, [facility] REFERRED SL:HTDSK Jeanie Cooks, M.D. PROCEDURE DATE:  09/07/2014 PROCEDURE:   Colonoscopy, screening, Colonoscopy with biopsy, Colonoscopy with snare polypectomy, and Submucosal injection, any substance First Screening Colonoscopy - Avg.  risk and is 50 yrs.  old or older - No.  Prior Negative Screening - Now for repeat screening. N/A  History of Adenoma - Now for follow-up colonoscopy & has been > or = to 3 yrs.  N/A ASA CLASS:   Class III INDICATIONS:Screening for colonic neoplasia and Colorectal Neoplasm Risk Assessment for this procedure is average risk. MEDICATIONS: Monitored anesthesia care and Propofol 250 mg IV DESCRIPTION OF PROCEDURE:   After the risks benefits and alternatives of the procedure were thoroughly explained, informed consent was obtained.  The digital rectal exam revealed no abnormalities of the rectum.   The [id number]  endoscope was introduced through the anus and advanced to the cecum, which was identified by both the appendix and ileocecal valve. No adverse events experienced.   The quality of the prep was good.  (MiraLax was used)  The instrument was then slowly withdrawn as the colon was fully examined.  COLON FINDINGS: Two pedunculated polyps measuring 10-12 mm in size were found in the ascending colon.  Polypectomies were performed using snare cautery.  The resection was complete, the polyp tissue was completely retrieved and sent to histology.   Three sessile polyps measuring 6 mm in size were found in the transverse colon and ascending colon.  Polypectomies were performed with a cold snare.  The resection was complete, the polyp tissue was completely retrieved and sent to histology. A 3/4 circumferential ulcerated mass,  measuring 4 X 3cm in size, was found in the transverse colon. Multiple biopsies of the lesion were performed.  Injection (tattooing) was performed proximal and distal to the mass. The examination was otherwise normal. Retroflexed views revealed external hemorrhoids. The time to cecum = 2.1 Withdrawal time = 19.3   The scope was withdrawn and the procedure completed. COMPLICATIONS: There were no immediate complications. ENDOSCOPIC IMPRESSION: 1.   Two pedunculated polyps in the ascending colon; polypectomies performed using snare cautery 2.   Three sessile polyps in the transverse colon and ascending colon; polypectomies performed with a cold snare 3.   Malignant appearing mass, in the transverse colon; multiple biopsies performed; injection (tattooing) performed 4.   External hemorrhoids  RECOMMENDATIONS: 1.  Hold Aspirin and all other NSAIDS for 2 weeks. 2.  Await pathology results 3.  My office will arrange for you to have a CT scan of abdomen and pelvis. 4.  My office will arrange for you to meet with a surgeon.  eSigned:  Ladene Artist, MD, [facility] 09/07/2014 2:45 PM

## 2014-09-07 NOTE — Progress Notes (Signed)
A/ox3, pleased with MAC, report to RN 

## 2014-09-07 NOTE — Telephone Encounter (Signed)
-----   Message from Ladene Artist, MD sent at 09/07/2014  2:55 PM EDT ----- Please order CBC, CMET, CEA, TSH, PT, PTT See colonoscopy report from today

## 2014-09-07 NOTE — Progress Notes (Signed)
Patient 's caregiver given oral contrast and instruction sheet for completion. Direction sheet given for [place] [facility]. Patient to lab prior to leaving for bloodwork as ordered by Larence Penning RN

## 2014-09-07 NOTE — Patient Instructions (Addendum)
No Aspirin, Aspirin products or NSAIDS (motrin, Aleve, Advil, Ibuprofen, etc) until May 9,2016. Dr. Lynne Leader office will arrange an abd. CT scan. Dr. Lynne Leader office will call you with an appointment  For Surgical consult.    YOU HAD AN ENDOSCOPIC PROCEDURE TODAY AT [facility]:   Refer to the procedure report that was given to you for any specific questions about what was found during the examination.  If the procedure report does not answer your questions, please call your gastroenterologist to clarify.  If you requested that your care partner not be given the details of your procedure findings, then the procedure report has been included in a sealed envelope for you to review at your convenience later.  YOU SHOULD EXPECT: Some feelings of bloating in the abdomen. Passage of more gas than usual.  Walking can help get rid of the air that was put into your GI tract during the procedure and reduce the bloating. If you had a lower endoscopy (such as a colonoscopy or flexible sigmoidoscopy) you may notice spotting of blood in your stool or on the toilet paper. If you underwent a bowel prep for your procedure, you may not have a normal bowel movement for a few days.  Please Note:  You might notice some irritation and congestion in your nose or some drainage.  This is from the oxygen used during your procedure.  There is no need for concern and it should clear up in a day or so.  SYMPTOMS TO REPORT IMMEDIATELY:   Following lower endoscopy (colonoscopy or flexible sigmoidoscopy):  Excessive amounts of blood in the stool  Significant tenderness or worsening of abdominal pains  Swelling of the abdomen that is new, acute  Fever of 100F or higher   For urgent or emergent issues, a gastroenterologist can be reached at any hour by calling [phone].   DIET: Your first meal following the procedure should be a small meal and then it is ok to progress to your normal diet. Heavy or  fried foods are harder to digest and may make you feel nauseous or bloated.  Likewise, meals heavy in dairy and vegetables can increase bloating.  Drink plenty of fluids but you should avoid alcoholic beverages for 24 hours.  ACTIVITY:  You should plan to take it easy for the rest of today and you should NOT DRIVE or use heavy machinery until tomorrow (because of the sedation medicines used during the test).    FOLLOW UP: Our staff will call the number listed on your records the next business day following your procedure to check on you and address any questions or concerns that you may have regarding the information given to you following your procedure. If we do not reach you, we will leave a message.  However, if you are feeling well and you are not experiencing any problems, there is no need to return our call.  We will assume that you have returned to your regular daily activities without incident.  If any biopsies were taken you will be contacted by phone or by letter within the next 1-3 weeks.  Please call us at [phone] if you have not heard about the biopsies in 3 weeks.    SIGNATURES/CONFIDENTIALITY: You and/or your care partner have signed paperwork which will be entered into your electronic medical record.  These signatures attest to the fact that that the information above on your After Visit Summary has been reviewed and is understood.  Full  responsibility of the confidentiality of this discharge information lies with you and/or your care-partner.

## 2014-09-07 NOTE — Telephone Encounter (Signed)
Lab notified they will add these on

## 2014-09-07 NOTE — Telephone Encounter (Signed)
Discussed with Dr. Fuller Plan will change CT abd and pelvis on the procedure report to CT Chest, abdomen, and pelvis.   Patient is scheduled with [facility] Dr. Marcello Moores 09/24/14 arrive at 8:30 for 9:30 CT chest abd/pelvis scheduled for 09/09/14 at 3:30 All appt and referral information given to his caregiver Janey Greaser.  They will call back for any additional questions or concerns

## 2014-09-07 NOTE — Progress Notes (Signed)
Called to room to assist during endoscopic procedure.  Patient ID and intended procedure confirmed with present staff. Received instructions for my participation in the procedure from the performing physician.  

## 2014-09-08 ENCOUNTER — Telehealth: Payer: Self-pay

## 2014-09-08 LAB — CEA: CEA: 2.6 ng/mL (ref 0.0–5.0)

## 2014-09-08 NOTE — Telephone Encounter (Signed)
  Follow up Call-  Call back number 09/07/2014  Post procedure Call Back phone  # [phone]  Caregiver  Permission to leave phone message Yes     Patient questions:  Do you have a fever, pain , or abdominal swelling? No. Pain Score  0 *  Have you tolerated food without any problems? Yes.    Have you been able to return to your normal activities? Yes.    Do you have any questions about your discharge instructions: Diet   No. Medications  No. Follow up visit  No.  Do you have questions or concerns about your Care? No.  Actions: * If pain score is 4 or above: No action needed, pain <4.  I spoke with the pt's care giver, Thayer Jew.  She said the pt had no problems last night.  maw

## 2014-09-09 ENCOUNTER — Ambulatory Visit (INDEPENDENT_AMBULATORY_CARE_PROVIDER_SITE_OTHER)
Admission: RE | Admit: 2014-09-09 | Discharge: 2014-09-09 | Disposition: A | Discharge status: Home / Self Care | Payer: Medicare Other | Source: Ambulatory Visit | Attending: Gastroenterology | Admitting: Gastroenterology

## 2014-09-09 DIAGNOSIS — K6389 Other specified diseases of intestine: Secondary | ICD-10-CM | POA: Diagnosis not present

## 2014-09-09 DIAGNOSIS — C189 Malignant neoplasm of colon, unspecified: Secondary | ICD-10-CM | POA: Diagnosis not present

## 2014-09-09 MED ORDER — IOHEXOL 300 MG/ML  SOLN
100.0000 mL | Freq: Once | INTRAMUSCULAR | Status: AC | PRN
  Administered 2014-09-09: 100 mL via INTRAVENOUS

## 2014-10-29 ENCOUNTER — Other Ambulatory Visit: Payer: Self-pay | Admitting: General Surgery

## 2014-10-29 DIAGNOSIS — K769 Liver disease, unspecified: Secondary | ICD-10-CM

## 2014-11-12 ENCOUNTER — Ambulatory Visit
Admission: RE | Admit: 2014-11-12 | Discharge: 2014-11-12 | Disposition: A | Discharge status: Home / Self Care | Payer: Medicare Other | Source: Ambulatory Visit | Attending: General Surgery | Admitting: General Surgery

## 2014-11-12 MED ORDER — GADOXETATE DISODIUM 0.25 MMOL/ML IV SOLN
5.0000 mL | Freq: Once | INTRAVENOUS | Status: AC | PRN
  Administered 2014-11-12: 5 mL via INTRAVENOUS

## 2014-11-24 ENCOUNTER — Other Ambulatory Visit: Payer: Self-pay | Admitting: General Surgery

## 2014-11-24 NOTE — H&P (Addendum)
[name] 10/29/2014 11:51 AM Location: [facility] Patient #: [id number] DOB: [birth year] Single / Language: [name] / Race: Black or African American Male  History of Present Illness Derek Ruff MD; 4/96/7591 1:35 PM) Patient words: colon mass.  The patient is a 80 year old male who presents with colorectal cancer. 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 Colonoscopy was performed due to anemia. Patient denies any changes in bowel habits or bloody bowel movements. Patient denies any abdominal pain. He denies any weight loss.   Other Problems Derek Lorenzo, LPN; 6/38/4665 99:35 AM) Alcohol Abuse Arthritis Back Pain High blood pressure Seizure Disorder  Past Surgical History Derek Lorenzo, LPN; 11/13/7791 90:30 AM) Colon Polyp Removal - Colonoscopy  Diagnostic Studies History Derek Lorenzo, LPN; 0/92/3300 76:22 AM) Colonoscopy within last year  Allergies Derek Lorenzo, LPN; 6/33/3545 62:56 AM) No Known Allergies06/16/2016  Medication History Derek Lorenzo, LPN; 3/89/3734 28:76 AM) Donepezil HCl (10MG  Tablet, Oral) Active. CloNIDine HCl (0.1MG  Tablet, Oral) Active. Zanaflex (4MG  Capsule, Oral) Active. Ultram (50MG  Tablet, Oral) Active. Ensure High Protein (Oral) Active. Medications Reconciled  Social History Derek Lorenzo, LPN; 12/23/5724 20:35 AM) Alcohol use Heavy alcohol use. Caffeine use Coffee. Illicit drug use Remotely quit drug use. Tobacco use Former smoker.  Family History Derek Lorenzo, LPN; 5/97/4163 84:53 AM) Family history unknown First Degree Relatives  Review of Systems Derek Ruff MD; 6/46/8032 1:36 PM) General Present- Weight Loss. Not Present- Appetite Loss, Chills, Fatigue, Fever, Night Sweats and Weight Gain. Skin Present- Dryness. Not Present- Change in Wart/Mole, Hives, Jaundice, New Lesions, Non-Healing Wounds, Rash and  Ulcer. Respiratory Not Present- Bloody sputum, Chronic Cough, Difficulty Breathing, Snoring and Wheezing. Breast Not Present- Breast Mass, Breast Pain, Nipple Discharge and Skin Changes. Cardiovascular Not Present- Calf Cramps, Chest Pain, Difficulty Breathing Lying Down and Edema. Gastrointestinal Present- Abdominal Pain and Difficulty Swallowing. Not Present- Bloating, Bloody Stool, Change in Bowel Habits, Chronic diarrhea, Constipation, Excessive gas, Gets full quickly at meals, Hemorrhoids, Indigestion, Nausea, Rectal Pain and Vomiting. Male Genitourinary Present- Urine Leakage. Not Present- Blood in Urine, Change in Urinary Stream, Frequency, Impotence, Nocturia, Painful Urination and Urgency.   Vitals Derek Billings Dockery LPN; 06/06/4823 00:37 AM) 10/29/2014 11:52 AM Weight: 114.2 lb Height: 59.5in Body Surface Area: 1.47 m Body Mass Index: 22.68 kg/m Temp.: 98.50F(Oral)  Pulse: 75 (Regular)  BP: 118/84 (Sitting, Left Arm, Standard)    Physical Exam Derek Ruff MD; 0/48/8891 1:35 PM) General Mental Status-Alert. General Appearance-Consistent with stated age. Hydration-Well hydrated. Voice-Normal.  Head and Neck Head-normocephalic, atraumatic with no lesions or palpable masses. Trachea-midline. Thyroid Gland Characteristics - normal size and consistency.  Eye Eyeball - Bilateral-Extraocular movements intact. Sclera/Conjunctiva - Bilateral-No scleral icterus.  Chest and Lung Exam Chest and lung exam reveals -quiet, even and easy respiratory effort with no use of accessory muscles and on auscultation, normal breath sounds, no adventitious sounds and normal vocal resonance. Inspection Chest Wall - Normal. Back - normal.  Cardiovascular Cardiovascular examination reveals -normal heart sounds, regular rate and rhythm with no murmurs and normal pedal pulses bilaterally.  Abdomen Inspection Inspection of the abdomen reveals - No  Hernias. Palpation/Percussion Palpation and Percussion of the abdomen reveal - Soft, Non Tender, No Rebound tenderness, No Rigidity (guarding) and No hepatosplenomegaly. Auscultation Auscultation of the abdomen reveals - Bowel sounds normal.  Neurologic Neurologic evaluation reveals -alert and oriented x 3 with no impairment of recent or remote memory. Mental Status-Normal.  Musculoskeletal Global  Assessment -Note: no gross deformities.  Normal Exam - Left-Upper Extremity Strength Normal and Lower Extremity Strength Normal. Normal Exam - Right-Upper Extremity Strength Normal and Lower Extremity Strength Normal.    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 We will then call you with the results. 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 Fuller Plan. The area was tattooed proximally and distally. Biopsies confirmed adenocarcinoma. CEA was 2.6. CT scan of chest abdomen and pelvis showed a possible liver lesion. I would like to get an MRI of his liver to follow-up on the lesion seen on CT scan. If the MRI shows findings consistent with a benign lesion, we will proceed with laparoscopic extended left hemicolectomy. We discussed the small possibility of needing a total colectomy or colostomy if his transverse colon would not reach down to his rectum for anastomosis.  Addendum: MRI shows liver lesion is not consistent with metastatic disease.   The surgery and anatomy were described to the patient as well as the risks of surgery and the possible complications. These include: Bleeding, deep abdominal infections and possible wound complications such as hernia and infection, damage to adjacent structures, leak of surgical connections, which can lead to other surgeries and possibly an ostomy,  possible need for other procedures, such as abscess drains in radiology, possible prolonged hospital stay, possible diarrhea from removal of part of the colon, possible constipation from narcotics, possible bowel, bladder or sexual dysfunction if having rectal surgery, prolonged fatigue/weakness or appetite loss, possible early recurrence of of disease, possible complications of their medical problems such as heart disease or arrhythmias or lung problems, death (less than 1%). I believe the patient understands and wishes to proceed with the surgery.

## 2014-12-11 ENCOUNTER — Encounter (HOSPITAL_COMMUNITY): Payer: Self-pay

## 2014-12-11 NOTE — Patient Instructions (Addendum)
YOUR PROCEDURE IS SCHEDULED ON : 12/16/14  REPORT TO [facility]TR[facility] FOLLOW SIGNS TO EAST ELEVATOR - GO TO 3rd FLOOR CHECK IN AT 3 EAST NURSES STATION (SHORT STAY) AT:  6:30 AM  CALL THIS NUMBER IF YOU HAVE PROBLEMS THE MORNING OF SURGERY [phone]  REMEMBER:ONLY 1 PER PERSON MAY GO TO SHORT STAY WITH YOU TO GET READY THE MORNING OF YOUR SURGERY  DO NOT EAT FOOD OR DRINK LIQUIDS AFTER MIDNIGHT  TAKE THESE MEDICINES THE MORNING OF SURGERY: TIZANIDINE / TRAMADOL IF NEEDED  STOP ASPIRIN / IBUPROFEN / ALEVE / VITAMINS / HERBAL MEDS __5__ DAYS BEFORE SURGERY  FOLLOW BOWEL PREP INST FROM OFFICE  YOU MAY NOT HAVE ANY METAL ON YOUR BODY INCLUDING HAIR PINS AND PIERCING'S. DO NOT WEAR JEWELRY, MAKEUP, LOTIONS, POWDERS OR PERFUMES. DO NOT WEAR NAIL POLISH. DO NOT SHAVE 48 HRS PRIOR TO SURGERY. MEN MAY SHAVE FACE AND NECK.  DO NOT [place]. [facility] IS NOT RESPONSIBLE FOR VALUABLES.  CONTACTS, DENTURES OR PARTIALS MAY NOT BE WORN TO SURGERY. LEAVE SUITCASE IN CAR. CAN BE BROUGHT TO ROOM AFTER SURGERY.  PATIENTS DISCHARGED THE DAY OF SURGERY WILL NOT BE ALLOWED TO DRIVE HOME.  PLEASE READ OVER THE FOLLOWING INSTRUCTION SHEETS _________________________________________________________________________________                                          [facility] - PREPARING FOR SURGERY  Before surgery, you can play an important role.  Because skin is not sterile, your skin needs to be as free of germs as possible.  You can reduce the number of germs on your skin by washing with CHG (chlorahexidine gluconate) soap before surgery.  CHG is an antiseptic cleaner which kills germs and bonds with the skin to continue killing germs even after washing. Please DO NOT use if you have an allergy to CHG or antibacterial soaps.  If your skin becomes reddened/irritated stop using the CHG and inform your nurse when you arrive at Short Stay. Do not shave  (including legs and underarms) for at least 48 hours prior to the first CHG shower.  You may shave your face. Please follow these instructions carefully:   1.  Shower with CHG Soap the night before surgery and the  morning of Surgery.   2.  If you choose to wash your hair, wash your hair first as usual with your  normal  Shampoo.   3.  After you shampoo, rinse your hair and body thoroughly to remove the  shampoo.                                         4.  Use CHG as you would any other liquid soap.  You can apply chg directly  to the skin and wash . Gently wash with scrungie or clean wascloth    5.  Apply the CHG Soap to your body ONLY FROM THE NECK DOWN.   Do not use on open                           Wound or open sores. Avoid contact with eyes, ears mouth and genitals (private parts).  Genitals (private parts) with your normal soap.              6.  Wash thoroughly, paying special attention to the area where your surgery  will be performed.   7.  Thoroughly rinse your body with warm water from the neck down.   8.  DO NOT shower/wash with your normal soap after using and rinsing off  the CHG Soap .                9.  Pat yourself dry with a clean towel.             10.  Wear clean night clothes to bed after shower             11.  Place clean sheets on your bed the night of your first shower and do not  sleep with pets.  Day of Surgery : Do not apply any lotions/deodorants the morning of surgery.  Please wear clean clothes to the hospital/surgery center.  FAILURE TO FOLLOW THESE INSTRUCTIONS MAY RESULT IN THE CANCELLATION OF YOUR SURGERY    PATIENT SIGNATURE_________________________________  ______________________________________________________________________

## 2014-12-14 ENCOUNTER — Encounter (HOSPITAL_COMMUNITY)
Admission: RE | Admit: 2014-12-14 | Discharge: 2014-12-14 | Disposition: A | Discharge status: Home / Self Care | Payer: Medicare Other | Source: Ambulatory Visit | Attending: General Surgery | Admitting: General Surgery

## 2014-12-14 ENCOUNTER — Encounter (HOSPITAL_COMMUNITY): Payer: Self-pay

## 2014-12-14 HISTORY — DX: Malignant neoplasm of colon, unspecified: C18.9

## 2014-12-14 HISTORY — PX: HEMICOLECTOMY: SHX854

## 2014-12-14 LAB — COMPREHENSIVE METABOLIC PANEL
ALT: 28 U/L (ref 17–63)
AST: 39 U/L (ref 15–41)
Albumin: 3.8 g/dL (ref 3.5–5.0)
Alkaline Phosphatase: 51 U/L (ref 38–126)
Anion gap: 7 (ref 5–15)
BILIRUBIN TOTAL: 1 mg/dL (ref 0.3–1.2)
BUN: 12 mg/dL (ref 6–20)
CO2: 24 mmol/L (ref 22–32)
Calcium: 8.9 mg/dL (ref 8.9–10.3)
Chloride: 109 mmol/L (ref 101–111)
Creatinine, Ser: 1.07 mg/dL (ref 0.61–1.24)
GFR calc Af Amer: >60 mL/min (ref >60)
GFR calc non Af Amer: >60 mL/min (ref >60)
Glucose, Bld: 99 mg/dL (ref 65–99)
Potassium: 4.4 mmol/L (ref 3.5–5.1)
Sodium: 140 mmol/L (ref 135–145)
TOTAL PROTEIN: 7.2 g/dL (ref 6.5–8.1)

## 2014-12-14 LAB — CBC
HCT: 29.1 % — ABNORMAL LOW (ref 39.0–52.0)
HEMOGLOBIN: 9.4 g/dL — AB (ref 13.0–17.0)
MCH: 31.4 pg (ref 26.0–34.0)
MCHC: 32.3 g/dL (ref 30.0–36.0)
MCV: 97.3 fL (ref 78.0–100.0)
Platelets: 168 10*3/uL (ref 150–400)
RBC: 2.99 MIL/uL — AB (ref 4.22–5.81)
RDW: 12.3 % (ref 11.5–15.5)
WBC: 5.4 10*3/uL (ref 4.0–10.5)

## 2014-12-15 LAB — TYPE AND SCREEN
ABO/RH(D): O POS
ANTIBODY SCREEN: NEGATIVE

## 2014-12-15 LAB — HEMOGLOBIN A1C
HEMOGLOBIN A1C: 5.1 % (ref 4.8–5.6)
Mean Plasma Glucose: 100 mg/dL

## 2014-12-15 NOTE — Anesthesia Preprocedure Evaluation (Addendum)
Anesthesia Evaluation  Patient identified by MRN, date of birth, ID band Patient awake    Reviewed: Allergy & Precautions, NPO status , Patient's Chart, lab work & pertinent test results, reviewed documented beta blocker date and time   History of Anesthesia Complications (+) AWARENESS UNDER ANESTHESIA  Airway Mallampati: II   Neck ROM: Full    Dental  (+) Edentulous Upper, Missing, Dental Advisory Given,    Pulmonary former smoker (quit 1977),  breath sounds clear to auscultation        Cardiovascular hypertension, Pt. on medications Rhythm:Regular  EKG ok   Neuro/Psych    GI/Hepatic (+)     substance abuse  alcohol use, Colon CA   Endo/Other  negative endocrine ROS  Renal/GU negative Renal ROS     Musculoskeletal   Abdominal (+)  Abdomen: soft.    Peds  Hematology 9.9/30  Has T&S   Anesthesia Other Findings   Reproductive/Obstetrics                           Anesthesia Physical Anesthesia Plan  ASA: III  Anesthesia Plan: General   Post-op Pain Management:    Induction: Intravenous  Airway Management Planned: Oral ETT  Additional Equipment:   Intra-op Plan:   Post-operative Plan:   Informed Consent: I have reviewed the patients History and Physical, chart, labs and discussed the procedure including the risks, benefits and alternatives for the proposed anesthesia with the patient or authorized representative who has indicated his/her understanding and acceptance.     Plan Discussed with:   Anesthesia Plan Comments: (2nd IV after induction especially if both arms tucked)        Anesthesia Quick Evaluation

## 2014-12-15 NOTE — Progress Notes (Signed)
Abnormal CBC faxed to Dr.Thomas

## 2014-12-16 ENCOUNTER — Encounter (HOSPITAL_COMMUNITY)
Admission: RE | Disposition: A | Discharge status: Skilled Nursing Facility | Payer: Self-pay | Source: Ambulatory Visit | Attending: General Surgery

## 2014-12-16 ENCOUNTER — Inpatient Hospital Stay (HOSPITAL_COMMUNITY)
Admission: RE | Admit: 2014-12-16 | Discharge: 2014-12-22 | DRG: 331 | Disposition: A | Discharge status: Skilled Nursing Facility | Payer: Medicare Other | Source: Ambulatory Visit | Attending: General Surgery | Admitting: General Surgery

## 2014-12-16 ENCOUNTER — Encounter (HOSPITAL_COMMUNITY): Payer: Self-pay

## 2014-12-16 ENCOUNTER — Inpatient Hospital Stay (HOSPITAL_COMMUNITY): Payer: Medicare Other | Admitting: Anesthesiology

## 2014-12-16 DIAGNOSIS — C184 Malignant neoplasm of transverse colon: Secondary | ICD-10-CM | POA: Diagnosis present

## 2014-12-16 DIAGNOSIS — G40909 Epilepsy, unspecified, not intractable, without status epilepticus: Secondary | ICD-10-CM | POA: Diagnosis present

## 2014-12-16 DIAGNOSIS — D649 Anemia, unspecified: Secondary | ICD-10-CM | POA: Diagnosis present

## 2014-12-16 DIAGNOSIS — Z01812 Encounter for preprocedural laboratory examination: Secondary | ICD-10-CM

## 2014-12-16 DIAGNOSIS — M1990 Unspecified osteoarthritis, unspecified site: Secondary | ICD-10-CM | POA: Diagnosis present

## 2014-12-16 DIAGNOSIS — Z79899 Other long term (current) drug therapy: Secondary | ICD-10-CM | POA: Diagnosis not present

## 2014-12-16 DIAGNOSIS — R5381 Other malaise: Secondary | ICD-10-CM | POA: Diagnosis not present

## 2014-12-16 DIAGNOSIS — M549 Dorsalgia, unspecified: Secondary | ICD-10-CM | POA: Diagnosis present

## 2014-12-16 DIAGNOSIS — I10 Essential (primary) hypertension: Secondary | ICD-10-CM | POA: Diagnosis present

## 2014-12-16 DIAGNOSIS — Z87891 Personal history of nicotine dependence: Secondary | ICD-10-CM | POA: Diagnosis not present

## 2014-12-16 DIAGNOSIS — K7460 Unspecified cirrhosis of liver: Secondary | ICD-10-CM | POA: Diagnosis present

## 2014-12-16 DIAGNOSIS — C189 Malignant neoplasm of colon, unspecified: Secondary | ICD-10-CM | POA: Diagnosis not present

## 2014-12-16 DIAGNOSIS — C19 Malignant neoplasm of rectosigmoid junction: Secondary | ICD-10-CM | POA: Diagnosis present

## 2014-12-16 SURGERY — COLECTOMY, PARTIAL, ROBOT-ASSISTED, LAPAROSCOPIC
Anesthesia: General | Site: Abdomen

## 2014-12-16 MED ORDER — MORPHINE SULFATE 2 MG/ML IJ SOLN
2.0000 mg | INTRAMUSCULAR | Status: DC | PRN
  Administered 2014-12-16: 2 mg via INTRAVENOUS
  Filled 2014-12-16: qty 1

## 2014-12-16 MED ORDER — ROCURONIUM BROMIDE 100 MG/10ML IV SOLN
INTRAVENOUS | Status: AC
  Filled 2014-12-16: qty 1

## 2014-12-16 MED ORDER — LIDOCAINE HCL (CARDIAC) 20 MG/ML IV SOLN
INTRAVENOUS | Status: AC
  Filled 2014-12-16: qty 5

## 2014-12-16 MED ORDER — PROPOFOL 10 MG/ML IV BOLUS
INTRAVENOUS | Status: AC
  Filled 2014-12-16: qty 20

## 2014-12-16 MED ORDER — ONDANSETRON HCL 4 MG/2ML IJ SOLN
4.0000 mg | Freq: Four times a day (QID) | INTRAMUSCULAR | Status: DC | PRN
  Administered 2014-12-16: 4 mg via INTRAVENOUS
  Filled 2014-12-16: qty 2

## 2014-12-16 MED ORDER — PROMETHAZINE HCL 25 MG/ML IJ SOLN: 6.2500 mg | INTRAMUSCULAR | Status: DC | PRN

## 2014-12-16 MED ORDER — MEPERIDINE HCL 50 MG/ML IJ SOLN: 6.2500 mg | INTRAMUSCULAR | Status: DC | PRN

## 2014-12-16 MED ORDER — HYDROCODONE-ACETAMINOPHEN 5-325 MG PO TABS
1.0000 | ORAL_TABLET | ORAL | Status: DC | PRN
  Administered 2014-12-19 – 2014-12-20 (×2): 1 via ORAL
  Filled 2014-12-16 (×2): qty 1

## 2014-12-16 MED ORDER — PHENYLEPHRINE HCL 10 MG/ML IJ SOLN: INTRAMUSCULAR | Status: DC | PRN

## 2014-12-16 MED ORDER — FENTANYL CITRATE (PF) 100 MCG/2ML IJ SOLN
INTRAMUSCULAR | Status: AC
  Filled 2014-12-16: qty 2

## 2014-12-16 MED ORDER — GLYCOPYRROLATE 0.2 MG/ML IJ SOLN
INTRAMUSCULAR | Status: DC | PRN
  Administered 2014-12-16: .8 mg via INTRAVENOUS

## 2014-12-16 MED ORDER — EPHEDRINE SULFATE 50 MG/ML IJ SOLN
INTRAMUSCULAR | Status: AC
  Filled 2014-12-16: qty 1

## 2014-12-16 MED ORDER — ONDANSETRON HCL 4 MG/2ML IJ SOLN
INTRAMUSCULAR | Status: AC
  Filled 2014-12-16: qty 2

## 2014-12-16 MED ORDER — FENTANYL CITRATE (PF) 100 MCG/2ML IJ SOLN
25.0000 ug | INTRAMUSCULAR | Status: DC | PRN
  Administered 2014-12-16: 25 ug via INTRAVENOUS

## 2014-12-16 MED ORDER — TIZANIDINE HCL 4 MG PO TABS
4.0000 mg | ORAL_TABLET | Freq: Two times a day (BID) | ORAL | Status: DC | PRN
  Filled 2014-12-16: qty 1

## 2014-12-16 MED ORDER — LACTATED RINGERS IV SOLN
INTRAVENOUS | Status: DC | PRN
  Administered 2014-12-16 (×2): via INTRAVENOUS

## 2014-12-16 MED ORDER — FENTANYL CITRATE (PF) 250 MCG/5ML IJ SOLN
INTRAMUSCULAR | Status: AC
  Filled 2014-12-16: qty 25

## 2014-12-16 MED ORDER — KCL IN DEXTROSE-NACL 30-5-0.45 MEQ/L-%-% IV SOLN
INTRAVENOUS | Status: DC
  Administered 2014-12-16 – 2014-12-22 (×5): via INTRAVENOUS
  Filled 2014-12-16 (×7): qty 1000

## 2014-12-16 MED ORDER — PHENYLEPHRINE HCL 10 MG/ML IJ SOLN
10.0000 mg | INTRAMUSCULAR | Status: DC | PRN
  Administered 2014-12-16: 50 ug/min via INTRAVENOUS

## 2014-12-16 MED ORDER — HEPARIN SODIUM (PORCINE) 5000 UNIT/ML IJ SOLN
5000.0000 [IU] | Freq: Once | INTRAMUSCULAR | Status: AC
  Administered 2014-12-16: 5000 [IU] via SUBCUTANEOUS
  Filled 2014-12-16: qty 1

## 2014-12-16 MED ORDER — PROPOFOL 10 MG/ML IV BOLUS
INTRAVENOUS | Status: DC | PRN
  Administered 2014-12-16: 100 mg via INTRAVENOUS

## 2014-12-16 MED ORDER — BUPIVACAINE-EPINEPHRINE 0.25% -1:200000 IJ SOLN
INTRAMUSCULAR | Status: AC
  Filled 2014-12-16: qty 1

## 2014-12-16 MED ORDER — ENOXAPARIN SODIUM 40 MG/0.4ML ~~LOC~~ SOLN
40.0000 mg | SUBCUTANEOUS | Status: DC
  Administered 2014-12-17 – 2014-12-22 (×6): 40 mg via SUBCUTANEOUS
  Filled 2014-12-16 (×6): qty 0.4

## 2014-12-16 MED ORDER — METOPROLOL TARTRATE 1 MG/ML IV SOLN: 5.0000 mg | Freq: Four times a day (QID) | INTRAVENOUS | Status: DC | PRN

## 2014-12-16 MED ORDER — BUPIVACAINE-EPINEPHRINE 0.25% -1:200000 IJ SOLN
INTRAMUSCULAR | Status: DC | PRN
  Administered 2014-12-16: 25 mL

## 2014-12-16 MED ORDER — DEXTROSE 5 % IV SOLN
2.0000 g | Freq: Two times a day (BID) | INTRAVENOUS | Status: AC
  Administered 2014-12-16: 2 g via INTRAVENOUS
  Filled 2014-12-16: qty 2

## 2014-12-16 MED ORDER — SODIUM CHLORIDE 0.9 % IJ SOLN
INTRAMUSCULAR | Status: AC
  Filled 2014-12-16: qty 10

## 2014-12-16 MED ORDER — LACTATED RINGERS IV SOLN
INTRAVENOUS | Status: DC | PRN
  Administered 2014-12-16 (×2): via INTRAVENOUS

## 2014-12-16 MED ORDER — HYDROCHLOROTHIAZIDE 12.5 MG PO CAPS
12.5000 mg | ORAL_CAPSULE | Freq: Every day | ORAL | Status: DC
  Administered 2014-12-16 – 2014-12-22 (×7): 12.5 mg via ORAL
  Filled 2014-12-16 (×7): qty 1

## 2014-12-16 MED ORDER — ALVIMOPAN 12 MG PO CAPS
12.0000 mg | ORAL_CAPSULE | Freq: Two times a day (BID) | ORAL | Status: DC
  Administered 2014-12-17 (×2): 12 mg via ORAL
  Filled 2014-12-16 (×2): qty 1

## 2014-12-16 MED ORDER — NEOSTIGMINE METHYLSULFATE 10 MG/10ML IV SOLN
INTRAVENOUS | Status: DC | PRN
  Administered 2014-12-16: 5 mg via INTRAVENOUS

## 2014-12-16 MED ORDER — ONDANSETRON HCL 4 MG PO TABS: 4.0000 mg | ORAL_TABLET | Freq: Four times a day (QID) | ORAL | Status: DC | PRN

## 2014-12-16 MED ORDER — ONDANSETRON HCL 4 MG/2ML IJ SOLN
INTRAMUSCULAR | Status: DC | PRN
  Administered 2014-12-16: 4 mg via INTRAVENOUS

## 2014-12-16 MED ORDER — LIDOCAINE HCL (CARDIAC) 20 MG/ML IV SOLN
INTRAVENOUS | Status: DC | PRN
  Administered 2014-12-16: 100 mg via INTRAVENOUS

## 2014-12-16 MED ORDER — EPHEDRINE SULFATE 50 MG/ML IJ SOLN
INTRAMUSCULAR | Status: DC | PRN
  Administered 2014-12-16 (×2): 10 mg via INTRAVENOUS
  Administered 2014-12-16: 5 mg via INTRAVENOUS
  Administered 2014-12-16: 10 mg via INTRAVENOUS

## 2014-12-16 MED ORDER — MIDAZOLAM HCL 2 MG/2ML IJ SOLN
INTRAMUSCULAR | Status: AC
  Filled 2014-12-16: qty 4

## 2014-12-16 MED ORDER — CEFOTETAN DISODIUM-DEXTROSE 2-2.08 GM-% IV SOLR
INTRAVENOUS | Status: AC
  Filled 2014-12-16: qty 50

## 2014-12-16 MED ORDER — ROCURONIUM BROMIDE 100 MG/10ML IV SOLN
INTRAVENOUS | Status: DC | PRN
  Administered 2014-12-16 (×2): 40 mg via INTRAVENOUS

## 2014-12-16 MED ORDER — ACETAMINOPHEN 500 MG PO TABS
1000.0000 mg | ORAL_TABLET | Freq: Four times a day (QID) | ORAL | Status: AC
  Administered 2014-12-17 (×3): 1000 mg via ORAL
  Filled 2014-12-16 (×3): qty 2

## 2014-12-16 MED ORDER — NEOSTIGMINE METHYLSULFATE 10 MG/10ML IV SOLN
INTRAVENOUS | Status: AC
  Filled 2014-12-16: qty 1

## 2014-12-16 MED ORDER — GLYCOPYRROLATE 0.2 MG/ML IJ SOLN
INTRAMUSCULAR | Status: AC
  Filled 2014-12-16: qty 3

## 2014-12-16 MED ORDER — FENTANYL CITRATE (PF) 100 MCG/2ML IJ SOLN
INTRAMUSCULAR | Status: DC | PRN
  Administered 2014-12-16: 100 ug via INTRAVENOUS
  Administered 2014-12-16: 25 ug via INTRAVENOUS

## 2014-12-16 MED ORDER — ALVIMOPAN 12 MG PO CAPS
12.0000 mg | ORAL_CAPSULE | Freq: Once | ORAL | Status: AC
  Administered 2014-12-16: 12 mg via ORAL
  Filled 2014-12-16: qty 1

## 2014-12-16 MED ORDER — DEXTROSE 5 % IV SOLN
2.0000 g | INTRAVENOUS | Status: AC
  Administered 2014-12-16: 2 g via INTRAVENOUS

## 2014-12-16 MED ORDER — SODIUM CHLORIDE 0.9 % IV SOLN
10000.0000 ug | INTRAVENOUS | Status: DC | PRN
  Administered 2014-12-16: 10 ug via INTRAVENOUS

## 2014-12-16 SURGICAL SUPPLY — 92 items
BLADE EXTENDED COATED 6.5IN (ELECTRODE) IMPLANT
CANNULA REDUC XI 12-8 STAPL (CANNULA) ×1
CANNULA REDUC XI 12-8MM STAPL (CANNULA) ×1
CANNULA REDUCER 12-8 DVNC XI (CANNULA) ×1 IMPLANT
CELLS DAT CNTRL 66122 CELL SVR (MISCELLANEOUS) IMPLANT
CLIP LIGATING HEM O LOK PURPLE (MISCELLANEOUS) ×2 IMPLANT
CLIP LIGATING HEMOLOK MED (MISCELLANEOUS) IMPLANT
COUNTER NEEDLE 20 DBL MAG RED (NEEDLE) ×3 IMPLANT
COVER MAYO STAND STRL (DRAPES) ×6 IMPLANT
COVER SURGICAL LIGHT HANDLE (MISCELLANEOUS) ×3 IMPLANT
COVER TIP SHEARS 8 DVNC (MISCELLANEOUS) ×1 IMPLANT
COVER TIP SHEARS 8MM DA VINCI (MISCELLANEOUS) ×2
DECANTER SPIKE VIAL GLASS SM (MISCELLANEOUS) ×3 IMPLANT
DEVICE TROCAR PUNCTURE CLOSURE (ENDOMECHANICALS) ×2 IMPLANT
DRAPE ARM DVNC X/XI (DISPOSABLE) ×4 IMPLANT
DRAPE COLUMN DVNC XI (DISPOSABLE) ×1 IMPLANT
DRAPE DA VINCI XI ARM (DISPOSABLE) ×8
DRAPE DA VINCI XI COLUMN (DISPOSABLE) ×2
DRAPE SURG IRRIG POUCH 19X23 (DRAPES) ×3 IMPLANT
DRSG OPSITE POSTOP 4X10 (GAUZE/BANDAGES/DRESSINGS) IMPLANT
DRSG OPSITE POSTOP 4X6 (GAUZE/BANDAGES/DRESSINGS) ×2 IMPLANT
DRSG OPSITE POSTOP 4X8 (GAUZE/BANDAGES/DRESSINGS) IMPLANT
ELECT PENCIL ROCKER SW 15FT (MISCELLANEOUS) ×6 IMPLANT
ELECT REM PT RETURN 15FT ADLT (MISCELLANEOUS) ×3 IMPLANT
ENDOLOOP SUT PDS II  0 18 (SUTURE)
ENDOLOOP SUT PDS II 0 18 (SUTURE) IMPLANT
EVACUATOR SILICONE 100CC (DRAIN) IMPLANT
GAUZE SPONGE 4X4 12PLY STRL (GAUZE/BANDAGES/DRESSINGS) IMPLANT
GLOVE BIO SURGEON STRL SZ 6.5 (GLOVE) ×6 IMPLANT
GLOVE BIO SURGEONS STRL SZ 6.5 (GLOVE) ×3
GLOVE BIOGEL PI IND STRL 7.0 (GLOVE) ×3 IMPLANT
GLOVE BIOGEL PI INDICATOR 7.0 (GLOVE) ×10
GOWN STRL REUS W/TWL 2XL LVL3 (GOWN DISPOSABLE) ×9 IMPLANT
GOWN STRL REUS W/TWL XL LVL3 (GOWN DISPOSABLE) ×12 IMPLANT
HOLDER FOLEY CATH W/STRAP (MISCELLANEOUS) ×3 IMPLANT
LEGGING LITHOTOMY PAIR STRL (DRAPES) ×3 IMPLANT
NDL INSUFFLATION 14GA 120MM (NEEDLE) ×1 IMPLANT
NEEDLE INSUFFLATION 14GA 120MM (NEEDLE) ×3 IMPLANT
PACK CARDIOVASCULAR III (CUSTOM PROCEDURE TRAY) ×3 IMPLANT
PACK COLON (CUSTOM PROCEDURE TRAY) ×3 IMPLANT
PEN SKIN MARKING BROAD (MISCELLANEOUS) ×3 IMPLANT
PORT LAP GEL ALEXIS MED 5-9CM (MISCELLANEOUS) ×2 IMPLANT
RELOAD STAPLE 45 BLU REG DVNC (STAPLE) IMPLANT
RELOAD STAPLE 45 GRN THCK DVNC (STAPLE) IMPLANT
RETRACTOR WND ALEXIS 18 MED (MISCELLANEOUS) IMPLANT
RTRCTR WOUND ALEXIS 18CM MED (MISCELLANEOUS)
SCISSORS LAP 5X35 DISP (ENDOMECHANICALS) ×3 IMPLANT
SEAL CANN UNIV 5-8 DVNC XI (MISCELLANEOUS) ×3 IMPLANT
SEAL XI 5MM-8MM UNIVERSAL (MISCELLANEOUS) ×6
SEALER VESSEL DA VINCI XI (MISCELLANEOUS) ×2
SEALER VESSEL EXT DVNC XI (MISCELLANEOUS) ×1 IMPLANT
SET IRRIG TUBING LAPAROSCOPIC (IRRIGATION / IRRIGATOR) ×3 IMPLANT
SLEEVE XCEL OPT CAN 5 100 (ENDOMECHANICALS) IMPLANT
SOLUTION ELECTROLUBE (MISCELLANEOUS) ×3 IMPLANT
STAPLER 45 BLU RELOAD XI (STAPLE) IMPLANT
STAPLER 45 BLUE RELOAD XI (STAPLE)
STAPLER 45 GREEN RELOAD XI (STAPLE)
STAPLER 45 GRN RELOAD XI (STAPLE) IMPLANT
STAPLER CANNULA SEAL DVNC XI (STAPLE) ×1 IMPLANT
STAPLER CANNULA SEAL XI (STAPLE) ×2
STAPLER CIRC ILS CVD 33MM 37CM (STAPLE) ×2 IMPLANT
STAPLER CUT CVD 40MM BLUE (STAPLE) ×2 IMPLANT
STAPLER CUT RELOAD BLUE (STAPLE) ×2 IMPLANT
STAPLER SHEATH (SHEATH) ×2
STAPLER SHEATH ENDOWRIST DVNC (SHEATH) ×1 IMPLANT
STAPLER VISISTAT 35W (STAPLE) ×3 IMPLANT
SUCTION POOLE TIP (SUCTIONS) ×2 IMPLANT
SUT NOVA NAB GS-21 1 T12 (SUTURE) ×2 IMPLANT
SUT PDS AB 1 CTX 36 (SUTURE) IMPLANT
SUT PDS AB 1 TP1 96 (SUTURE) IMPLANT
SUT PROLENE 2 0 KS (SUTURE) ×3 IMPLANT
SUT SILK 2 0 (SUTURE) ×3
SUT SILK 2 0 SH CR/8 (SUTURE) ×3 IMPLANT
SUT SILK 2-0 18XBRD TIE 12 (SUTURE) ×1 IMPLANT
SUT SILK 3 0 (SUTURE) ×3
SUT SILK 3 0 SH CR/8 (SUTURE) ×3 IMPLANT
SUT SILK 3-0 18XBRD TIE 12 (SUTURE) ×1 IMPLANT
SUT VIC AB 2-0 SH 18 (SUTURE) ×5 IMPLANT
SUT VIC AB 2-0 SH 27 (SUTURE) ×3
SUT VIC AB 2-0 SH 27X BRD (SUTURE) ×1 IMPLANT
SUT VIC AB 3-0 SH 18 (SUTURE) IMPLANT
SUT VIC AB 4-0 PS2 27 (SUTURE) ×8 IMPLANT
SYRINGE 10CC LL (SYRINGE) ×3 IMPLANT
SYS LAPSCP GELPORT 120MM (MISCELLANEOUS)
SYSTEM LAPSCP GELPORT 120MM (MISCELLANEOUS) IMPLANT
TOWEL OR 17X26 10 PK STRL BLUE (TOWEL DISPOSABLE) ×3 IMPLANT
TOWEL OR NON WOVEN STRL DISP B (DISPOSABLE) ×3 IMPLANT
TRAY FOLEY W/METER SILVER 14FR (SET/KITS/TRAYS/PACK) ×3 IMPLANT
TROCAR BLADELESS OPT 5 100 (ENDOMECHANICALS) ×3 IMPLANT
TUBING CONNECTING 10 (TUBING) IMPLANT
TUBING CONNECTING 10' (TUBING)
TUBING FILTER THERMOFLATOR (ELECTROSURGICAL) ×3 IMPLANT

## 2014-12-16 NOTE — Interval H&P Note (Signed)
History and Physical Interval Note:  12/16/2014 8:22 AM  [name]  has presented today for surgery, with the diagnosis of colon cancer  The various methods of treatment have been discussed with the patient and family. After consideration of risks, benefits and other options for treatment, the patient has consented to  Procedure(s): XI ROBOT ASSISTED LAPAROSCOPIC PARTIAL COLECTOMY (N/A) as a surgical intervention .  The patient's history has been reviewed, patient examined, no change in status, stable for surgery.  I have reviewed the patient's chart and labs.  Questions were answered to the patient's satisfaction.    We discussed total colectomy with IRA vs end colostomy if needed.  Pt and caregivers prefer ostomy vs complications associated with IRA.     Rosario Adie, MD  [facility]

## 2014-12-16 NOTE — Op Note (Addendum)
12/16/2014  12:10 PM  PATIENT:  [name]  80 y.o. male  Patient Care Team: Nolene Ebbs, MD as PCP - General (Internal Medicine)  PRE-OPERATIVE DIAGNOSIS:  colon cancer  POST-OPERATIVE DIAGNOSIS:  colon cancer  PROCEDURE:  XI ROBOT ASSISTED EXTENDED LEFT COLECTOMY WITH SPLENIC FLEXURE MOBILIZATION  Surgeon(s): [name], MD Ralene Ok, MD  ASSISTANT: Dr Rosendo Gros   ANESTHESIA:   local and general  EBL: 45ml Total I/O In: 2000 [I.V.:2000] Out: 5456 [Urine:1075]  Delay start of Pharmacological VTE agent (>24hrs) due to surgical blood loss or risk of bleeding:  no  DRAINS: none   SPECIMEN:  Source of Specimen:  L colon  DISPOSITION OF SPECIMEN:  PATHOLOGY  COUNTS:  YES  PLAN OF CARE: Admit to inpatient   PATIENT DISPOSITION:  PACU - hemodynamically stable.  INDICATION:    80 y.o. M with colon cancer in L transverse colon.  I recommended segmental resection:  The anatomy & physiology of the digestive tract was discussed.  The pathophysiology was discussed.  Natural history risks without surgery was discussed.   I worked to give an overview of the disease and the frequent need to have multispecialty involvement.  I feel the risks of no intervention will lead to serious problems that outweigh the operative risks; therefore, I recommended a partial colectomy to remove the pathology.  Laparoscopic & open techniques were discussed.   Risks such as bleeding, infection, abscess, leak, reoperation, possible ostomy, hernia, heart attack, death, and other risks were discussed.  I noted a good likelihood this will help address the problem.   Goals of post-operative recovery were discussed as well.    The patient expressed understanding & wished to proceed with surgery.  OR FINDINGS:   Patient had a mass in his L transverse colon with tattoo identified proximally and distally.    No obvious metastatic disease on visceral parietal peritoneum or liver.  The patient had  obvious cirrhosis of the liver with a cystic, benign-appearing lesion in the left lateral segment.   DESCRIPTION:   Informed consent was confirmed.  The patient underwent general anaesthesia without difficulty.  The patient was positioned appropriately.  VTE prevention in place.  The patient's abdomen was clipped, prepped, & draped in a sterile fashion.  Surgical timeout confirmed our plan.  The patient was positioned in reverse Trendelenburg.  Abdominal entry was gained using a Varies needle in the LUQ.  Entry was clean. I placed an 8 mm robotic port in the right upper quadrant.  I induced carbon dioxide insufflation.  Camera inspection revealed no injury.  Extra ports were carefully placed under direct laparoscopic visualization.   I reflected the greater omentum and the upper abdomen the small bowel in the upper abdomen.   I placed the patient right side down and head up. I then docked the robot to the patient's left side.  Ports were introduced under direct visualization. The splenic flexure was mobilized from a proximal to distal standpoint. This was done using the vessel sealer and blunt dissection. Careful attention was a to not injuring the underlying structures.  The tattooed area was identified on the colon. The mass was identified in between these 2 areas. I identified the left branch of the middle colic as supplying this mass. I dissected down to its takeoff from the middle colic and divided this with the robotic vessel sealer. After this was completed in the retroperitoneal plane was mobilized, we undocked the robot and change the patient position to Trendelenburg.  Instruments are once again placed under direct visualization. I identified and divided the base of peritoneum of the right side of the mesentery of the left colon from the ligament of Treitz to the peritoneal reflection of the mid rectum.   I elevated the sigmoid mesentery and enetered into the retro-mesenteric plane. We were able to  identify the left ureter and gonadal vessels. We kept those posterior within the retroperitoneum and elevated the left colon mesentery off that. I did isolated IMA pedicle but did not ligate it yet.  I continued distally and got into the avascular plane posterior to the mesorectum. This allowed me to help mobilize the rectum as well by freeing the mesorectum off the sacrum.  I mobilized the peritoneal coverings towards the peritoneal reflection on both the right and left sides of the rectum.  The hypogastric nerves were identified and dissected away from the rectum.  I could see the right and left ureters and stayed away from them as well.    I skeletonized the inferior mesenteric artery pedicle.  I went down to its takeoff from the aorta.   I isolated the inferior mesenteric vein off of the ligament of Treitz just cephalad to that as well.  After confirming the left ureter was out of the way, I went ahead and ligated the inferior mesenteric artery pedicle with bipolar Vessel sealer ~2cm above its takeoff from the aorta.   I ligated the inferior mesenteric vein in a similar fashion.  We ensured hemostasis. I skeletonized the mesorectum at the junction at the proximal rectum using blunt dissection & the vessel sealer.  I mobilized the left colon in a lateral to medial fashion off the line of Toldt up towards the splenic flexure to ensure good mobilization of the transverse colon to reach into the pelvis.  I mobilized the transverse colon mesentery off of the lateral duodenal attachments bluntly. This allowed for the remaining transverse colon to reach into the pelvis.   At this point the robot was undocked. A Pfannenstiel incision was made by enlarging the 8 mm suprapubic port site. Dissection was carried down to the fascia. The fascia was then elevated and separated from the underlying muscle. The peritoneum was then entered bluntly. An Henderson wound protector was placed. The proximal end of the colon was  transected with a contour stapler possibly 5 cm proximal to the colon mass. We then divided the rectosigmoid junction in similar fashion also using the contour stapler blue load. The remaining mesentery was divided and the superior hemorrhoidal artery was tied with a 2-0 silk suture. This was then sent to pathology for further examination. The proximal and was noted to be where the tattoo and mass was located.  The remaining transverse colon was identified. There was good bleeding noted at the edges of the mesentery. I placed a pursestring device on the end of the proximal colon. A 2-0 Prolene suture was placed into the pursestring device and secured with 3-0 silk sutures. A 33 mm EEA anvil was then introduced into the proximal end of the colon and the pursestring was tied tightly around this. The EEA stapler was then introduced into the rectum and brought out through the distal rectal stump. An anastomosis was created without tension. There was no leak when tested with insufflation under water. After this was completed the abdomen was reinsufflated and evaluated one more time. Hemostasis appeared to be good. The colon anastomosis was without tension. The small bowel was brought out over top  of this and the omentum was then brought over that. The remaining ports were removed. The Endwell wound protector was removed. We switched to clean gowns, gloves, instruments and drapes.   I closed the peritoneum with a running 2-0 Vicryl suture. The fascia was closed using 2 #1 Novafil sutures. The subcutaneous tissue was reapproximated using interrupted 2-0 Vicryl sutures. The skin of the extraction port as well as the port sites was closed using 4-0 Vicryl suture. Dermabond was placed over the port sites and a sterile dressing was placed over the extraction site. The patient was awakened from anesthesia and sent to the postanesthesia care unit in stable condition. All counts were correct per operating room staff.

## 2014-12-16 NOTE — Anesthesia Postprocedure Evaluation (Signed)
  Anesthesia Post-op Note  Patient: [name]  Procedure(s) Performed: Procedure(s): XI ROBOT ASSISTED LAPAROSCOPIC PARTIAL COLECTOMY (N/A)  Patient Location: PACU  Anesthesia Type:General  Level of Consciousness: awake  Airway and Oxygen Therapy: Patient Spontanous Breathing  Post-op Pain: mild  Post-op Assessment: Post-op Vital signs reviewed and Patient's Cardiovascular Status Stable              Post-op Vital Signs: Reviewed and stable  Last Vitals:  Filed Vitals:   12/16/14 1315  BP: 128/67  Pulse: 53  Temp:   Resp: 17    Complications: No apparent anesthesia complications

## 2014-12-16 NOTE — Transfer of Care (Signed)
Immediate Anesthesia Transfer of Care Note  Patient: [name]  Procedure(s) Performed: Procedure(s): XI ROBOT ASSISTED LAPAROSCOPIC PARTIAL COLECTOMY (N/A)  Patient Location: PACU  Anesthesia Type:General  Level of Consciousness: awake, alert  and oriented  Airway & Oxygen Therapy: Patient Spontanous Breathing and Patient connected to face mask oxygen  Post-op Assessment: Report given to RN and Post -op Vital signs reviewed and stable  Post vital signs: Reviewed and stable  Last Vitals:  Filed Vitals:   12/16/14 0636  BP: 166/80  Pulse: 52  Temp: 46.2 C    Complications: No apparent anesthesia complications

## 2014-12-16 NOTE — Anesthesia Procedure Notes (Signed)
Procedure Name: Intubation Date/Time: 12/16/2014 8:38 AM Performed by: Dimas Millin, Jaxx Huish F Pre-anesthesia Checklist: Patient identified, Emergency Drugs available, Suction available, Patient being monitored and Timeout performed Patient Re-evaluated:Patient Re-evaluated prior to inductionOxygen Delivery Method: Circle system utilized Preoxygenation: Pre-oxygenation with 100% oxygen Intubation Type: IV induction Ventilation: Mask ventilation without difficulty Laryngoscope Size: Miller and 2 Grade View: Grade I Tube type: Oral Tube size: 7.5 mm Number of attempts: 1 Placement Confirmation: ETT inserted through vocal cords under direct vision,  positive ETCO2 and breath sounds checked- equal and bilateral Secured at: 22 cm Tube secured with: Tape Dental Injury: Teeth and Oropharynx as per pre-operative assessment

## 2014-12-16 NOTE — H&P (View-Only) (Signed)
[name] 10/29/2014 11:51 AM Location: [facility] Patient #: [id number] DOB: 09-Mar-1935 Single / Language: Derek Molt / Race: Black or African American Male  History of Present Illness Derek Ruff MD; 12/23/9145 1:35 PM) Patient words: colon mass.  The patient is a 80 year old male who presents with colorectal cancer. 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 Colonoscopy was performed due to anemia. Patient denies any changes in bowel habits or bloody bowel movements. Patient denies any abdominal pain. He denies any weight loss.   Other Problems [name], LPN; 01/11/5620 30:86 AM) Alcohol Abuse Arthritis Back Pain High blood pressure Seizure Disorder  Past Surgical History [name], LPN; 5/78/4696 29:52 AM) Colon Polyp Removal - Colonoscopy  Diagnostic Studies History [name], LPN; 8/41/3244 01:02 AM) Colonoscopy within last year  Allergies [name], LPN; 12/07/3662 40:34 AM) No Known Allergies06/16/2016  Medication History [name], LPN; 7/42/5956 38:75 AM) Donepezil HCl (10MG  Tablet, Oral) Active. CloNIDine HCl (0.1MG  Tablet, Oral) Active. Zanaflex (4MG  Capsule, Oral) Active. Ultram (50MG  Tablet, Oral) Active. Ensure High Protein (Oral) Active. Medications Reconciled  Social History [name], LPN; 6/43/3295 18:84 AM) Alcohol use Heavy alcohol use. Caffeine use Coffee. Illicit drug use Remotely quit drug use. Tobacco use Former smoker.  Family History [name], LPN; 1/66/0630 16:01 AM) Family history unknown First Degree Relatives  Review of Systems Derek Ruff MD; 0/93/2355 1:36 PM) General Present- Weight Loss. Not Present- Appetite Loss, Chills, Fatigue, Fever, Night Sweats and Weight Gain. Skin Present- Dryness. Not Present- Change in Wart/Mole, Hives, Jaundice, New Lesions, Non-Healing Wounds, Rash and  Ulcer. Respiratory Not Present- Bloody sputum, Chronic Cough, Difficulty Breathing, Snoring and Wheezing. Breast Not Present- Breast Mass, Breast Pain, Nipple Discharge and Skin Changes. Cardiovascular Not Present- Calf Cramps, Chest Pain, Difficulty Breathing Lying Down and Edema. Gastrointestinal Present- Abdominal Pain and Difficulty Swallowing. Not Present- Bloating, Bloody Stool, Change in Bowel Habits, Chronic diarrhea, Constipation, Excessive gas, Gets full quickly at meals, Hemorrhoids, Indigestion, Nausea, Rectal Pain and Vomiting. Male Genitourinary Present- Urine Leakage. Not Present- Blood in Urine, Change in Urinary Stream, Frequency, Impotence, Nocturia, Painful Urination and Urgency.   Vitals [name] LPN; 7/32/2025 42:70 AM) 10/29/2014 11:52 AM Weight: 114.2 lb Height: 59.5in Body Surface Area: 1.47 m Body Mass Index: 22.68 kg/m Temp.: 98.49F(Oral)  Pulse: 75 (Regular)  BP: 118/84 (Sitting, Left Arm, Standard)    Physical Exam Derek Ruff MD; 11/05/7626 1:35 PM) General Mental Status-Alert. General Appearance-Consistent with stated age. Hydration-Well hydrated. Voice-Normal.  Head and Neck Head-normocephalic, atraumatic with no lesions or palpable masses. Trachea-midline. Thyroid Gland Characteristics - normal size and consistency.  Eye Eyeball - Bilateral-Extraocular movements intact. Sclera/Conjunctiva - Bilateral-No scleral icterus.  Chest and Lung Exam Chest and lung exam reveals -quiet, even and easy respiratory effort with no use of accessory muscles and on auscultation, normal breath sounds, no adventitious sounds and normal vocal resonance. Inspection Chest Wall - Normal. Back - normal.  Cardiovascular Cardiovascular examination reveals -normal heart sounds, regular rate and rhythm with no murmurs and normal pedal pulses bilaterally.  Abdomen Inspection Inspection of the abdomen reveals - No  Hernias. Palpation/Percussion Palpation and Percussion of the abdomen reveal - Soft, Non Tender, No Rebound tenderness, No Rigidity (guarding) and No hepatosplenomegaly. Auscultation Auscultation of the abdomen reveals - Bowel sounds normal.  Neurologic Neurologic evaluation reveals -alert and oriented x 3 with no impairment of recent or remote memory. Mental Status-Normal.  Musculoskeletal Global  Assessment -Note: no gross deformities.  Normal Exam - Left-Upper Extremity Strength Normal and Lower Extremity Strength Normal. Normal Exam - Right-Upper Extremity Strength Normal and Lower Extremity Strength Normal.    Assessment & Plan Derek Ruff MD; 11/07/6387 12:15 PM) MALIGNANT NEOPLASM OF TRANSVERSE COLON (153.1  C18.4) Story: we will call you to set up the MRI of your liver. We will then call you with the results. 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 Fuller Plan. The area was tattooed proximally and distally. Biopsies confirmed adenocarcinoma. CEA was 2.6. CT scan of chest abdomen and pelvis showed a possible liver lesion. I would like to get an MRI of his liver to follow-up on the lesion seen on CT scan. If the MRI shows findings consistent with a benign lesion, we will proceed with laparoscopic extended left hemicolectomy. We discussed the small possibility of needing a total colectomy or colostomy if his transverse colon would not reach down to his rectum for anastomosis.  Addendum: MRI shows liver lesion is not consistent with metastatic disease.   The surgery and anatomy were described to the patient as well as the risks of surgery and the possible complications. These include: Bleeding, deep abdominal infections and possible wound complications such as hernia and infection, damage to adjacent structures, leak of surgical connections, which can lead to other surgeries and possibly an ostomy,  possible need for other procedures, such as abscess drains in radiology, possible prolonged hospital stay, possible diarrhea from removal of part of the colon, possible constipation from narcotics, possible bowel, bladder or sexual dysfunction if having rectal surgery, prolonged fatigue/weakness or appetite loss, possible early recurrence of of disease, possible complications of their medical problems such as heart disease or arrhythmias or lung problems, death (less than 1%). I believe the patient understands and wishes to proceed with the surgery.

## 2014-12-17 LAB — BASIC METABOLIC PANEL
Anion gap: 6 (ref 5–15)
BUN: 7 mg/dL (ref 6–20)
CHLORIDE: 108 mmol/L (ref 101–111)
CO2: 20 mmol/L — ABNORMAL LOW (ref 22–32)
Calcium: 7.6 mg/dL — ABNORMAL LOW (ref 8.9–10.3)
Creatinine, Ser: 1.05 mg/dL (ref 0.61–1.24)
GFR calc non Af Amer: >60 mL/min (ref >60)
GLUCOSE: 118 mg/dL — AB (ref 65–99)
Potassium: 4.5 mmol/L (ref 3.5–5.1)
Sodium: 134 mmol/L — ABNORMAL LOW (ref 135–145)

## 2014-12-17 LAB — CBC
HCT: 22.9 % — ABNORMAL LOW (ref 39.0–52.0)
Hemoglobin: 7.5 g/dL — ABNORMAL LOW (ref 13.0–17.0)
MCH: 30.9 pg (ref 26.0–34.0)
MCHC: 32.8 g/dL (ref 30.0–36.0)
MCV: 94.2 fL (ref 78.0–100.0)
Platelets: 122 10*3/uL — ABNORMAL LOW (ref 150–400)
RBC: 2.43 MIL/uL — ABNORMAL LOW (ref 4.22–5.81)
RDW: 12.1 % (ref 11.5–15.5)
WBC: 10.9 10*3/uL — ABNORMAL HIGH (ref 4.0–10.5)

## 2014-12-17 NOTE — Evaluation (Signed)
Physical Therapy Evaluation Patient Details Name: [name] MRN: [id number] DOB: 30-Jul-1934 Today's Date: 12/17/2014   History of Present Illness  80 year old male with a history of hypertension and transverse colon cancer s/p lap partial colectomy 12/16/14  Clinical Impression  Pt admitted with above diagnosis. Pt currently with functional limitations due to the deficits listed below (see PT Problem List).  Pt will benefit from skilled PT to increase their independence and safety with mobility to allow discharge to the venue listed below.  Pt poor historian.  Pt assisted with ambulating in hallway and requiring min assist at this time.  Pt reports he has caregivers.  Recommend SNF if pt does not have 24/7 assist at home.     Follow Up Recommendations SNF;Supervision/Assistance - 24 hour    Equipment Recommendations  None recommended by PT (pt states he has RW)    Recommendations for Other Services       Precautions / Restrictions Precautions Precautions: Fall      Mobility  Bed Mobility Overal bed mobility: Needs Assistance Bed Mobility: Supine to Sit     Supine to sit: Min assist;HOB elevated     General bed mobility comments: verbal cues for technique, assist for trunk due to abdominal pain  Transfers Overall transfer level: Needs assistance Equipment used: Rolling walker (2 wheeled) Transfers: Sit to/from Stand Sit to Stand: Min assist         General transfer comment: assist to rise and steady  Ambulation/Gait Ambulation/Gait assistance: Min guard Ambulation Distance (Feet): 120 Feet Assistive device: Rolling walker (2 wheeled) Gait Pattern/deviations: Step-through pattern;Trunk flexed     General Gait Details: verbal cues for safe use of RW and posture  Stairs            Wheelchair Mobility    Modified Rankin (Stroke Patients Only)       Balance                                             Pertinent Vitals/Pain Pain  Assessment: Faces Faces Pain Scale: Hurts little more Pain Location: abdomen Pain Descriptors / Indicators: Sore Pain Intervention(s): Limited activity within patient's tolerance;Monitored during session;Repositioned    Home Living Family/patient expects to be discharged to:: Private residence   Available Help at Discharge: Personal care attendant Type of Home: House       Home Layout: One level Home Equipment: Environmental consultant - 2 wheels Additional Comments: per pt however poor historian    Prior Function Level of Independence: Independent with assistive device(s)         Comments: pt states he is independent then reports assist required for dressing     Hand Dominance        Extremity/Trunk Assessment               Lower Extremity Assessment: Generalized weakness         Communication   Communication: No difficulties  Cognition Arousal/Alertness: Awake/alert Behavior During Therapy: WFL for tasks assessed/performed Overall Cognitive Status: Within Functional Limits for tasks assessed                      General Comments      Exercises        Assessment/Plan    PT Assessment Patient needs continued PT services  PT Diagnosis Difficulty walking   PT Problem List Decreased  strength;Decreased activity tolerance;Decreased mobility;Decreased knowledge of use of DME;Pain  PT Treatment Interventions DME instruction;Gait training;Patient/family education;Functional mobility training;Therapeutic activities;Therapeutic exercise   PT Goals (Current goals can be found in the Care Plan section) Acute Rehab PT Goals PT Goal Formulation: With patient Time For Goal Achievement: 12/24/14 Potential to Achieve Goals: Good    Frequency Min 3X/week   Barriers to discharge        Co-evaluation               End of Session Equipment Utilized During Treatment: Gait belt Activity Tolerance: Patient limited by fatigue Patient left: in chair;with call  bell/phone within reach;with chair alarm set;with nursing/sitter in room           Time: 4034-7425 PT Time Calculation (min) (ACUTE ONLY): 14 min   Charges:   PT Evaluation $Initial PT Evaluation Tier I: 1 Procedure     PT G Codes:        Airabella Barley,KATHrine E 12/17/2014, 11:47 AM Carmelia Bake, PT, DPT 12/17/2014 Pager: [phone]

## 2014-12-17 NOTE — Clinical Social Work Note (Signed)
Clinical Social Work Assessment  Patient Details  Name: [name] MRN: [id number] Date of Birth: 09/12/1934  Date of referral:  12/17/14               Reason for consult:  Facility Placement                Permission sought to share information with:  Facility Art therapist granted to share information::  Yes, Verbal Permission Granted  Name::        Agency::     Relationship::     Contact Information:     Housing/Transportation Living arrangements for the past 2 months:  Single Family Home Source of Information:  Other (Comment Required) (patient's caregiver, Actor) Patient Interpreter Needed:  None Criminal Activity/Legal Involvement Pertinent to Current Situation/Hospitalization:  No - Comment as needed Significant Relationships:  Other Family Members Lives with:  Other (Comment) (caregiver, [name]) Do you feel safe going back to the place where you live?  No Need for family participation in patient care:  Yes (Comment)  Care giving concerns:  CSW received consult for SNF placement, reviewed PT evaluation recommending SNF as well.    Social Worker assessment / plan:  CSW spoke with patient & caregiver, [name] at bedside who are both agreeable with plan for SNF.   Employment status:  Retired Forensic scientist:  Information systems manager, Medicaid In [place] PT Recommendations:  [place] / Referral to community resources:  [facility]  Patient/Family's Response to care:  Patient's caregiver, [name] informed CSW that patient is her grandfather's best friend and he has been living with them, though recently he has become more dependent with ADLs.   Patient/Family's Understanding of and Emotional Response to Diagnosis, Current Treatment, and Prognosis:  Patient's caregiver is glad to know that he would be able to go to a SNF.   Emotional Assessment Appearance:  Appears stated age Attitude/Demeanor/Rapport:    Affect (typically  observed):  Quiet, Pleasant Orientation:  Oriented to Self Alcohol / Substance use:    Psych involvement (Current and /or in the community):     Discharge Needs  Concerns to be addressed:    Readmission within the last 30 days:    Current discharge risk:    Barriers to Discharge:      Standley Brooking, LCSW 12/17/2014, 12:15 PM

## 2014-12-17 NOTE — Progress Notes (Signed)
1 Day Post-Op robotic assisted extended L colectomy Subjective: Did well overnight, denies nausea.  No complaints of pain.    Objective: Vital signs in last 24 hours: Temp:  [94.2 F (34.6 C)-98.1 F (36.7 C)] 98 F (36.7 C) (08/04 0457) Pulse Rate:  [52-76] 70 (08/04 0457) Resp:  [13-18] 18 (08/04 0457) BP: (101-146)/(53-80) 101/53 mmHg (08/04 0457) SpO2:  [99 %-100 %] 100 % (08/04 0457)   Intake/Output from previous day: 08/03 0701 - 08/04 0700 In: 3977.5 [I.V.:3927.5; IV Piggyback:50] Out: 2275 [Urine:2275] Intake/Output this shift:     General appearance: alert and cooperative GI: normal findings: soft, non-tender  Incision: no significant drainage, no significant erythema  Lab Results:   Recent Labs  12/14/14 1350 12/17/14 0615  WBC 5.4 10.9*  HGB 9.4* 7.5*  HCT 29.1* 22.9*  PLT 168 122*   BMET  Recent Labs  12/14/14 1350 12/17/14 0350  NA 140 134*  K 4.4 4.5  CL 109 108  CO2 24 20*  GLUCOSE 99 118*  BUN 12 7  CREATININE 1.07 1.05  CALCIUM 8.9 7.6*   PT/INR No results for input(s): LABPROT, INR in the last 72 hours. ABG No results for input(s): PHART, HCO3 in the last 72 hours.  Invalid input(s): PCO2, PO2  MEDS, Scheduled . acetaminophen  1,000 mg Oral 4 times per day  . alvimopan  12 mg Oral BID  . enoxaparin (LOVENOX) injection  40 mg Subcutaneous Q24H  . hydrochlorothiazide  12.5 mg Oral Daily    Studies/Results: No results found.  Assessment: s/p Procedure(s): XI ROBOT ASSISTED LAPAROSCOPIC PARTIAL COLECTOMY Patient Active Problem List   Diagnosis Date Noted  . Colon cancer 12/16/2014    Expected post op course  Plan: Advance diet to clears Decrease MIV Ambulate, PT/OT eval.  Family asking about rehab needs.     LOS: 1 day     .Rosario Adie, [facility], [place]   12/17/2014 8:27 AM

## 2014-12-17 NOTE — Clinical Social Work Placement (Signed)
Patient's caregiver, Lora Havens to tour SNFs this afternoon & will get back to CSW re: SNF decision. Anticipating discharge Saturday, 12/19/14.     Raynaldo Opitz, [facility] Clinical Social Worker cell #: [phone]    CLINICAL SOCIAL WORK PLACEMENT  NOTE  Date:  12/17/2014  Patient Details  Name: [name] MRN: [id number] Date of Birth: [birth year]  Clinical Social Work is seeking post-discharge placement for this patient at the [place] level of care (*CSW will initial, date and re-position this form in  chart as items are completed):  Yes   Patient/family provided with [facility] Department's list of facilities offering this level of care within the geographic area requested by the patient (or if unable, by the patient's family).  Yes   Patient/family informed of their freedom to choose among providers that offer the needed level of care, that participate in Medicare, Medicaid or managed care program needed by the patient, have an available bed and are willing to accept the patient.  Yes   Patient/family informed of [facility]'s ownership interest in [facility] and [facility], as well as of the fact that they are under no obligation to receive care at these facilities.  PASRR submitted to EDS on 12/17/14     PASRR number received on 12/17/14     Existing PASRR number confirmed on       FL2 transmitted to all facilities in geographic area requested by pt/family on 12/17/14     FL2 transmitted to all facilities within larger geographic area on       Patient informed that his/her managed care company has contracts with or will negotiate with certain facilities, including the following:        Yes   Patient/family informed of bed offers received.  Patient chooses bed at       Physician recommends and patient chooses bed at      Patient to be transferred to   on  .  Patient to be transferred to facility by        Patient family notified on   of transfer.  Name of family member notified:        PHYSICIAN       Additional Comment:    _______________________________________________ Standley Brooking, LCSW 12/17/2014, 12:17 PM

## 2014-12-17 NOTE — Evaluation (Signed)
Occupational Therapy Evaluation Patient Details Name: [name] MRN: [id number] DOB: May 13, 1935 Today's Date: 12/17/2014    History of Present Illness 80 year old male with a history of hypertension and transverse colon cancer s/p lap partial colectomy 12/16/14   Clinical Impression   Pt was admitted for the above.  At baseline, he had a caregiver but completed ADLs at mod I level.  Pt will benefit from skilled OT to increase safety and independence with adls.  He is limited by pain and generalized weakness. Goals in acute are for min guard assist overall    Follow Up Recommendations  SNF    Equipment Recommendations  3 in 1 bedside comode    Recommendations for Other Services       Precautions / Restrictions Precautions Precautions: Fall Restrictions Weight Bearing Restrictions: No      Mobility Bed Mobility Overal bed mobility: Needs Assistance Bed Mobility: Supine to Sit     Supine to sit: Min assist;HOB elevated     General bed mobility comments: oob  Transfers Overall transfer level: Needs assistance Equipment used: Rolling walker (2 wheeled) Transfers: Sit to/from Stand Sit to Stand: Min assist         General transfer comment: assist to rise and steady    Balance                                            ADL Overall ADL's : Needs assistance/impaired     Grooming: Set up;Sitting   Upper Body Bathing: Set up;Sitting   Lower Body Bathing: Moderate assistance;Sit to/from stand   Upper Body Dressing : Minimal assistance;Sitting (lines)   Lower Body Dressing: Maximal assistance;Sit to/from stand                 General ADL Comments: performed sit to stand for pressure relief:  did not want to get back to bed.  Assisted pt with cleaning buttocks as there was a smear of fecal material on pad, which was changed.  Pt with dressing over sacrum     Vision     Perception     Praxis      Pertinent Vitals/Pain Pain  Assessment: Faces Faces Pain Scale: Hurts a little bit Pain Location: abdomen Pain Descriptors / Indicators: Sore Pain Intervention(s): Limited activity within patient's tolerance;Monitored during session;Repositioned     Hand Dominance     Extremity/Trunk Assessment Upper Extremity Assessment Upper Extremity Assessment: Generalized weakness          Communication Communication Communication: No difficulties   Cognition Arousal/Alertness: Awake/alert Behavior During Therapy: WFL for tasks assessed/performed Overall Cognitive Status:  (impaired:  uncertain of baseline) Area of Impairment: Memory               General Comments: poor historian.  Pt confused about phone/call bell.  Reviewed both with him   General Comments       Exercises       Shoulder Instructions      Home Living Family/patient expects to be discharged to:: Private residence   Available Help at Discharge: Personal care attendant Type of Home: [place]: One level               Home Equipment: Gilford Rile - 2 wheels   Additional Comments: per pt however poor historian      Prior Functioning/Environment Level  of Independence: Independent with assistive device(s)        Comments: caregiver present: pt did ADLs himself and used cane    OT Diagnosis: Generalized weakness   OT Problem List: Decreased strength;Decreased activity tolerance;Pain;Decreased knowledge of use of DME or AE;Decreased cognition;Decreased safety awareness   OT Treatment/Interventions: Self-care/ADL training;DME and/or AE instruction;Patient/family education;Balance training;Cognitive remediation/compensation    OT Goals(Current goals can be found in the care plan section) Acute Rehab OT Goals Patient Stated Goal: none stated OT Goal Formulation: With patient Time For Goal Achievement: 12/24/14 Potential to Achieve Goals: Good ADL Goals Pt Will Perform Grooming: with min guard  assist;standing Pt Will Transfer to Toilet: with min guard assist;ambulating;bedside commode Additional ADL Goal #1: Pt will call for assistance without cues when oriented to call light Additional ADL Goal #2: pt will complete LB ADLs with AE with min cues and min guard A for safety  OT Frequency: Min 2X/week   Barriers to D/C:            Co-evaluation              End of Session    Activity Tolerance: Patient tolerated treatment well Patient left: in chair;with call bell/phone within reach;with chair alarm set   Time: 3790-2409 OT Time Calculation (min): 14 min Charges:  OT General Charges $OT Visit: 1 Procedure OT Evaluation $Initial OT Evaluation Tier I: 1 Procedure G-Codes:    [name] 01/01/15, 2:09 PM   [name], OTR/L [phone] 01/01/15

## 2014-12-18 LAB — BASIC METABOLIC PANEL
Anion gap: 4 — ABNORMAL LOW (ref 5–15)
BUN: 7 mg/dL (ref 6–20)
CO2: 21 mmol/L — ABNORMAL LOW (ref 22–32)
Calcium: 7.7 mg/dL — ABNORMAL LOW (ref 8.9–10.3)
Chloride: 108 mmol/L (ref 101–111)
Creatinine, Ser: 1.11 mg/dL (ref 0.61–1.24)
GLUCOSE: 94 mg/dL (ref 65–99)
Potassium: 5.4 mmol/L — ABNORMAL HIGH (ref 3.5–5.1)
SODIUM: 133 mmol/L — AB (ref 135–145)

## 2014-12-18 LAB — CBC
HCT: 25.1 % — ABNORMAL LOW (ref 39.0–52.0)
Hemoglobin: 8.2 g/dL — ABNORMAL LOW (ref 13.0–17.0)
MCH: 31.4 pg (ref 26.0–34.0)
MCHC: 32.7 g/dL (ref 30.0–36.0)
MCV: 96.2 fL (ref 78.0–100.0)
Platelets: 119 10*3/uL — ABNORMAL LOW (ref 150–400)
RBC: 2.61 MIL/uL — ABNORMAL LOW (ref 4.22–5.81)
RDW: 12.1 % (ref 11.5–15.5)
WBC: 12.1 10*3/uL — ABNORMAL HIGH (ref 4.0–10.5)

## 2014-12-18 MED ORDER — ACETAMINOPHEN 325 MG PO TABS: 325.0000 mg | ORAL_TABLET | Freq: Four times a day (QID) | ORAL | Status: DC | PRN

## 2014-12-18 MED ORDER — IBUPROFEN 200 MG PO TABS
200.0000 mg | ORAL_TABLET | ORAL | Status: DC | PRN
Start: 2014-12-18 — End: 2014-12-22

## 2014-12-18 NOTE — Progress Notes (Signed)
2 Days Post-Op robotic assisted extended L colectomy Subjective: Did well yesterday, ambulated some, denies nausea.  No complaints of pain.  Tolerated clears.  Having BM's and flatus  Objective: Vital signs in last 24 hours: Temp:  [97.6 F (36.4 C)-98.5 F (36.9 C)] 98.1 F (36.7 C) (08/05 0400) Pulse Rate:  [57-65] 65 (08/05 0400) Resp:  [18-19] 18 (08/05 0400) BP: (118-151)/(59-73) 143/65 mmHg (08/05 0400) SpO2:  [98 %-100 %] 98 % (08/05 0400)   Intake/Output from previous day: 08/04 0701 - 08/05 0700 In: 1703.3 [P.O.:360; I.V.:1343.3] Out: 2050 [Urine:2050] Intake/Output this shift:   General appearance: alert and cooperative GI: normal findings: soft, non-tender  Incision: no significant drainage, no significant erythema  Lab Results:   Recent Labs  12/17/14 0615 12/18/14 0520  WBC 10.9* 12.1*  HGB 7.5* 8.2*  HCT 22.9* 25.1*  PLT 122* 119*   BMET  Recent Labs  12/17/14 0350 12/18/14 0411  NA 134* 133*  K 4.5 5.4*  CL 108 108  CO2 20* 21*  GLUCOSE 118* 94  BUN 7 7  CREATININE 1.05 1.11  CALCIUM 7.6* 7.7*   PT/INR No results for input(s): LABPROT, INR in the last 72 hours. ABG No results for input(s): PHART, HCO3 in the last 72 hours.  Invalid input(s): PCO2, PO2  MEDS, Scheduled . enoxaparin (LOVENOX) injection  40 mg Subcutaneous Q24H  . hydrochlorothiazide  12.5 mg Oral Daily    Studies/Results: No results found.  Assessment: s/p Procedure(s): XI ROBOT ASSISTED LAPAROSCOPIC PARTIAL COLECTOMY Patient Active Problem List   Diagnosis Date Noted  . Colon cancer 12/16/2014    Expected post op course  Plan: Advance diet to soft foods KVO MIV Ambulate, PT/OT recommending SNF upon d/c   LOS: 2 days     .Rosario Adie, [facility], [place]   12/18/2014 7:34 AM

## 2014-12-18 NOTE — Progress Notes (Signed)
Physical Therapy Treatment Patient Details Name: [name] MRN: [id number] DOB: 09-13-1934 Today's Date: 12/18/2014    History of Present Illness 80 year old male with a history of hypertension and transverse colon cancer s/p lap partial colectomy 12/16/14    PT Comments    Pt progressing with mobility. Min guard-Min assist for mobility. Pt walked ~250 feet with RW on today. No family present during session. Unsure of level of assist pt has available at home. If pt continues to progress well, may be able to d/c home with HHPT if he will have some assistance available at home.   Follow Up Recommendations  SNF     Equipment Recommendations  None recommended by PT    Recommendations for Other Services       Precautions / Restrictions Precautions Precautions: Fall Restrictions Weight Bearing Restrictions: No    Mobility  Bed Mobility Overal bed mobility: Needs Assistance Bed Mobility: Supine to Sit;Sit to Supine     Supine to sit: HOB elevated;Min guard Sit to supine: Min guard   General bed mobility comments: Increased time. VCS safety, technique  Transfers Overall transfer level: Needs assistance Equipment used: Rolling walker (2 wheeled) Transfers: Sit to/from Stand Sit to Stand: Min assist         General transfer comment: assist to rise and steady  Ambulation/Gait Ambulation/Gait assistance: Min guard Ambulation Distance (Feet): 250 Feet Assistive device: Rolling walker (2 wheeled) Gait Pattern/deviations: Step-through pattern;Decreased stride length;Trunk flexed     General Gait Details: verbal cues for safe use of RW and posture   Stairs            Wheelchair Mobility    Modified Rankin (Stroke Patients Only)       Balance Overall balance assessment: Needs assistance         Standing balance support: During functional activity Standing balance-Leahy Scale: Fair                      Cognition Arousal/Alertness:  Awake/alert Behavior During Therapy: WFL for tasks assessed/performed Overall Cognitive Status: No family/caregiver present to determine baseline cognitive functioning Area of Impairment: Memory                    Exercises      General Comments        Pertinent Vitals/Pain Pain Assessment: Faces Faces Pain Scale: Hurts even more Pain Location: abdomen Pain Descriptors / Indicators: Sore Pain Intervention(s): Limited activity within patient's tolerance;Repositioned    Home Living                      Prior Function            PT Goals (current goals can now be found in the care plan section) Progress towards PT goals: Progressing toward goals    Frequency  Min 3X/week    PT Plan Current plan remains appropriate    Co-evaluation             End of Session   Activity Tolerance: Patient limited by pain Patient left: in bed;with call bell/phone within reach;with bed alarm set     Time: 1009-1031 PT Time Calculation (min) (ACUTE ONLY): 22 min  Charges:  $Gait Training: 8-22 mins                    G Codes:      Weston Anna, MPT Pager: [phone]

## 2014-12-18 NOTE — Progress Notes (Signed)
Chaplain referred by social worker to assist wit Advance Directives. Chaplain provided a listening presence as well as support. Chaplain assisted with the completion of  Advance Directives. Chaplain called CSW for notary.    12/18/14 1400  Clinical Encounter Type  Visited With Patient and family together  Visit Type Initial;Spiritual support;Social support  Referral From Social work     12/18/14 1400  Clinical Encounter Type  Visited With Patient and family together  Visit Type Initial;Spiritual support;Social support  Referral From Social work

## 2014-12-18 NOTE — Care Management Important Message (Signed)
Important Message  Patient Details  Name: [name] MRN: [id number] Date of Birth: 13-Mar-1935   Medicare Important Message Given:  Yes-second notification given    Camillo Flaming 12/18/2014, 12:32 [place] Message  Patient Details  Name: [name] MRN: [id number] Date of Birth: 1934/08/23   Medicare Important Message Given:  Yes-second notification given    Camillo Flaming 12/18/2014, 12:32 PM

## 2014-12-18 NOTE — Progress Notes (Signed)
Patient to discharge to [facility] SNF when stable. Bryson Ha at [place] aware. If ready over the weekend, please contact weekend CSW, Rollene Fare (ph#: [phone]) to facilitate discharge.      Raynaldo Opitz, [facility] Clinical Social Worker cell #: [phone]

## 2014-12-19 LAB — BASIC METABOLIC PANEL
Anion gap: 6 (ref 5–15)
BUN: 8 mg/dL (ref 6–20)
CHLORIDE: 105 mmol/L (ref 101–111)
CO2: 24 mmol/L (ref 22–32)
Calcium: 8 mg/dL — ABNORMAL LOW (ref 8.9–10.3)
Creatinine, Ser: 1.03 mg/dL (ref 0.61–1.24)
GFR calc Af Amer: >60 mL/min (ref >60)
Glucose, Bld: 93 mg/dL (ref 65–99)
Potassium: 4.4 mmol/L (ref 3.5–5.1)
Sodium: 135 mmol/L (ref 135–145)

## 2014-12-19 LAB — CBC
HEMATOCRIT: 23.6 % — AB (ref 39.0–52.0)
Hemoglobin: 7.4 g/dL — ABNORMAL LOW (ref 13.0–17.0)
MCH: 29.7 pg (ref 26.0–34.0)
MCHC: 31.4 g/dL (ref 30.0–36.0)
MCV: 94.8 fL (ref 78.0–100.0)
Platelets: 126 10*3/uL — ABNORMAL LOW (ref 150–400)
RBC: 2.49 MIL/uL — ABNORMAL LOW (ref 4.22–5.81)
RDW: 12 % (ref 11.5–15.5)
WBC: 10.9 10*3/uL — AB (ref 4.0–10.5)

## 2014-12-19 NOTE — Progress Notes (Signed)
3 Days Post-Op  Subjective: Complains of pain, states he is passing flatus, not getting up because he says it hurts  Objective: Vital signs in last 24 hours: Temp:  [97.7 F (36.5 C)-98.4 F (36.9 C)] 97.7 F (36.5 C) (08/06 0425) Pulse Rate:  [66-71] 66 (08/06 0425) Resp:  [16] 16 (08/06 0425) BP: (125-137)/(63-70) 137/70 mmHg (08/06 0425) SpO2:  [99 %-100 %] 99 % (08/06 0425) Last BM Date: 12/18/14  Intake/Output from previous day: 08/05 0701 - 08/06 0700 In: 720.8 [P.O.:240; I.V.:480.8] Out: 1275 [Urine:1275] Intake/Output this shift:    General appearance: no distress Resp: clear to auscultation bilaterally Cardio: regular rate and rhythm GI: dressing dry, some bs approp tender  Lab Results:   Recent Labs  12/18/14 0520 12/19/14 0520  WBC 12.1* 10.9*  HGB 8.2* 7.4*  HCT 25.1* 23.6*  PLT 119* 126*   BMET  Recent Labs  12/18/14 0411 12/19/14 0520  NA 133* 135  K 5.4* 4.4  CL 108 105  CO2 21* 24  GLUCOSE 94 93  BUN 7 8  CREATININE 1.11 1.03  CALCIUM 7.7* 8.0*   PT/INR No results for input(s): LABPROT, INR in the last 72 hours. ABG No results for input(s): PHART, HCO3 in the last 72 hours.  Invalid input(s): PCO2, PO2  Studies/Results: No results found.  Anti-infectives: Anti-infectives    Start     Dose/Rate Route Frequency Ordered Stop   12/16/14 2000  cefoTEtan (CEFOTAN) 2 g in dextrose 5 % 50 mL IVPB     2 g 100 mL/hr over 30 Minutes Intravenous Every 12 hours 12/16/14 1525 12/16/14 2030   12/16/14 0634  cefoTEtan (CEFOTAN) 2 g in dextrose 5 % 50 mL IVPB     2 g 100 mL/hr over 30 Minutes Intravenous On call to O.R. 12/16/14 6144 12/16/14 3154      Assessment/Plan: POD 3 left colon  1. Po pain meds (states his pain is not under control and that is why he is not getting up) 2. pulm toilet, needs to be out of bed and ambulate today, ics 3. Soft diet 4. Lovenox, scds 5. If he does well today, he can be discharged to snf tomorrow if  will accept him  [facility] 12/19/2014

## 2014-12-20 NOTE — Progress Notes (Signed)
4 Days Post-Op  Subjective: States he is not passing gas, eating fine though, no n/v, not up much, states he is sore and that is why is not moving   Objective: Vital signs in last 24 hours: Temp:  [97.8 F (36.6 C)-98.3 F (36.8 C)] 98.3 F (36.8 C) (08/07 0438) Pulse Rate:  [64-68] 64 (08/07 0438) Resp:  [16-18] 16 (08/07 0438) BP: (117-149)/(62-76) 145/65 mmHg (08/07 0438) SpO2:  [99 %-100 %] 100 % (08/07 0438) Last BM Date: 12/18/14  Intake/Output from previous day: 08/06 0701 - 08/07 0700 In: 960.7 [P.O.:720; I.V.:240.7] Out: 1200 [Urine:1200] Intake/Output this shift: Total I/O In: -  Out: 200 [Urine:200]  Resp: clear to auscultation bilaterally Cardio: regular rate and rhythm GI: soft some bs not really tender   Lab Results:   Recent Labs  12/18/14 0520 12/19/14 0520  WBC 12.1* 10.9*  HGB 8.2* 7.4*  HCT 25.1* 23.6*  PLT 119* 126*   BMET  Recent Labs  12/18/14 0411 12/19/14 0520  NA 133* 135  K 5.4* 4.4  CL 108 105  CO2 21* 24  GLUCOSE 94 93  BUN 7 8  CREATININE 1.11 1.03  CALCIUM 7.7* 8.0*   PT/INR No results for input(s): LABPROT, INR in the last 72 hours. ABG No results for input(s): PHART, HCO3 in the last 72 hours.  Invalid input(s): PCO2, PO2  Studies/Results: No results found.  Anti-infectives: Anti-infectives    Start     Dose/Rate Route Frequency Ordered Stop   12/16/14 2000  cefoTEtan (CEFOTAN) 2 g in dextrose 5 % 50 mL IVPB     2 g 100 mL/hr over 30 Minutes Intravenous Every 12 hours 12/16/14 1525 12/16/14 2030   12/16/14 0634  cefoTEtan (CEFOTAN) 2 g in dextrose 5 % 50 mL IVPB     2 g 100 mL/hr over 30 Minutes Intravenous On call to O.R. 12/16/14 6834 12/16/14 1962      Assessment/Plan: POD 4 left colon  1. Po pain meds (states his pain is not under control and that is why he is not getting up still) 2. pulm toilet, needs to be out of bed and ambulate today, ics 3. Soft diet 4. Lovenox, scds 5. snf tomorrow if doing  fine  [facility] 12/20/2014

## 2014-12-21 NOTE — Care Management Important Message (Signed)
Important Message  Patient Details  Name: Carsyn Taubman MRN: [id number] Date of Birth: 1935/03/03   Medicare Important Message Given:  Yes-third notification given    Camillo Flaming 12/21/2014, 1:04 [place] Message  Patient Details  Name: Keyion Knack MRN: [id number] Date of Birth: Oct 13, 1934   Medicare Important Message Given:  Yes-third notification given    Camillo Flaming 12/21/2014, 1:03 PM

## 2014-12-21 NOTE — Progress Notes (Signed)
Physical Therapy Treatment Patient Details Name: [name] MRN: [id number] DOB: 28-Sep-1934 Today's Date: 12-31-14    History of Present Illness 80 year old male with a history of hypertension and transverse colon cancer s/p lap partial colectomy 12/16/14    PT Comments    Pt progressing well with mobility.  No family/caregivers present.  Recommend 24/7 supervision upon d/c, if not possible at home then may need SNF.  Follow Up Recommendations  SNF     Equipment Recommendations  None recommended by PT    Recommendations for Other Services       Precautions / Restrictions Precautions Precautions: Fall Restrictions Weight Bearing Restrictions: No    Mobility  Bed Mobility               General bed mobility comments: pt up in recliner on arrival  Transfers Overall transfer level: Needs assistance Equipment used: Rolling walker (2 wheeled) Transfers: Sit to/from Stand Sit to Stand: Min guard         General transfer comment: min/guard for safety  Ambulation/Gait Ambulation/Gait assistance: Min guard Ambulation Distance (Feet): 200 Feet Assistive device: Rolling walker (2 wheeled) Gait Pattern/deviations: Step-through pattern;Trunk flexed;Decreased stride length     General Gait Details: verbal cues for safe use of RW and posture   Stairs            Wheelchair Mobility    Modified Rankin (Stroke Patients Only)       Balance                                    Cognition Arousal/Alertness: Awake/alert Behavior During Therapy: WFL for tasks assessed/performed Overall Cognitive Status: No family/caregiver present to determine baseline cognitive functioning Area of Impairment: Memory                    Exercises      General Comments        Pertinent Vitals/Pain Pain Assessment: Faces Faces Pain Scale: Hurts a little bit Pain Location: abdomen Pain Descriptors / Indicators: Sore Pain Intervention(s): Limited  activity within patient's tolerance;Monitored during session    Home Living                      Prior Function            PT Goals (current goals can now be found in the care plan section) Progress towards PT goals: Progressing toward goals    Frequency  Min 3X/week    PT Plan Current plan remains appropriate    Co-evaluation             End of Session Equipment Utilized During Treatment: Gait belt Activity Tolerance: Patient tolerated treatment well Patient left: in chair;with chair alarm set;with call bell/phone within reach     Time: 5176-1607 PT Time Calculation (min) (ACUTE ONLY): 14 min  Charges:  $Gait Training: 8-22 mins                    G Codes:      [name],[name] December 31, 2014, 11:36 AM [name], PT, DPT 12/31/2014 Pager: [phone]

## 2014-12-21 NOTE — Progress Notes (Addendum)
CSW following for discharge to [facility] SNF when stable. FL2 has been signed, anticipating possible discharge tomorrow, 8/9.    Raynaldo Opitz, [facility] Clinical Social Worker cell #: [phone]

## 2014-12-21 NOTE — Progress Notes (Signed)
5 Days Post-Op robotic assisted extended L colectomy Subjective: Did ok over the weekend, ambulated some per pt, denies nausea.  No complaints of pain.  Tolerating a diet.  Denies any BM's or flatus  Objective: Vital signs in last 24 hours: Temp:  [98.2 F (36.8 C)-98.9 F (37.2 C)] 98.6 F (37 C) (08/08 0550) Pulse Rate:  [65-87] 65 (08/08 0550) Resp:  [16-18] 16 (08/08 0550) BP: (110-127)/(51-75) 121/73 mmHg (08/08 0550) SpO2:  [98 %-100 %] 100 % (08/08 0550)   Intake/Output from previous day: 08/07 0701 - 08/08 0700 In: 670 [P.O.:600; I.V.:70] Out: 650 [Urine:650] Intake/Output this shift:   General appearance: alert and cooperative GI: normal findings: soft, non-tender  Incision: no significant drainage, no significant erythema  Lab Results:   Recent Labs  12/19/14 0520  WBC 10.9*  HGB 7.4*  HCT 23.6*  PLT 126*   BMET  Recent Labs  12/19/14 0520  NA 135  K 4.4  CL 105  CO2 24  GLUCOSE 93  BUN 8  CREATININE 1.03  CALCIUM 8.0*   PT/INR No results for input(s): LABPROT, INR in the last 72 hours. ABG No results for input(s): PHART, HCO3 in the last 72 hours.  Invalid input(s): PCO2, PO2  MEDS, Scheduled . enoxaparin (LOVENOX) injection  40 mg Subcutaneous Q24H  . hydrochlorothiazide  12.5 mg Oral Daily    Studies/Results: No results found.  Assessment: s/p Procedure(s): XI ROBOT ASSISTED LAPAROSCOPIC PARTIAL COLECTOMY Patient Active Problem List   Diagnosis Date Noted  . Colon cancer 12/16/2014    Expected post op course  Plan: Cont soft foods Increase MIV due to low uop Ambulate, PT/OT recommending SNF upon d/c   LOS: 5 days     .Rosario Adie, [facility], [place]   12/21/2014 7:32 AM

## 2014-12-22 ENCOUNTER — Non-Acute Institutional Stay (SKILLED_NURSING_FACILITY): Payer: Medicare Other | Admitting: Internal Medicine

## 2014-12-22 DIAGNOSIS — R5381 Other malaise: Secondary | ICD-10-CM | POA: Diagnosis not present

## 2014-12-22 DIAGNOSIS — I10 Essential (primary) hypertension: Secondary | ICD-10-CM | POA: Diagnosis not present

## 2014-12-22 DIAGNOSIS — C189 Malignant neoplasm of colon, unspecified: Secondary | ICD-10-CM

## 2014-12-22 LAB — CBC
HCT: 24.7 % — ABNORMAL LOW (ref 39.0–52.0)
HEMOGLOBIN: 8.1 g/dL — AB (ref 13.0–17.0)
MCH: 31.6 pg (ref 26.0–34.0)
MCHC: 32.8 g/dL (ref 30.0–36.0)
MCV: 96.5 fL (ref 78.0–100.0)
Platelets: 183 10*3/uL (ref 150–400)
RBC: 2.56 MIL/uL — ABNORMAL LOW (ref 4.22–5.81)
RDW: 12.5 % (ref 11.5–15.5)
WBC: 6.8 10*3/uL (ref 4.0–10.5)

## 2014-12-22 LAB — BASIC METABOLIC PANEL
ANION GAP: 6 (ref 5–15)
BUN: 16 mg/dL (ref 6–20)
CALCIUM: 8.3 mg/dL — AB (ref 8.9–10.3)
CO2: 26 mmol/L (ref 22–32)
CREATININE: 0.87 mg/dL (ref 0.61–1.24)
Chloride: 102 mmol/L (ref 101–111)
GFR calc non Af Amer: >60 mL/min (ref >60)
Glucose, Bld: 104 mg/dL — ABNORMAL HIGH (ref 65–99)
Potassium: 4.6 mmol/L (ref 3.5–5.1)
Sodium: 134 mmol/L — ABNORMAL LOW (ref 135–145)

## 2014-12-22 MED ORDER — HYDROCODONE-ACETAMINOPHEN 5-325 MG PO TABS: 1.0000 | ORAL_TABLET | ORAL | Status: DC | PRN

## 2014-12-22 MED ORDER — IBUPROFEN 200 MG PO TABS: 200.0000 mg | ORAL_TABLET | ORAL | Status: DC | PRN

## 2014-12-22 NOTE — Discharge Instructions (Signed)

## 2014-12-22 NOTE — Clinical Social Work Placement (Signed)
Patient is set to discharge to [facility] today. Patient & caregiver, [name] aware. Discharge packet given to RN, Gregary Signs. [facility] called for transport.     Raynaldo Opitz, [facility] Clinical Social Worker cell #: [phone]    CLINICAL SOCIAL WORK PLACEMENT  NOTE  Date:  12/22/2014  Patient Details  Name: [name] MRN: [id number] Date of Birth: 04-16-1935  Clinical Social Work is seeking post-discharge placement for this patient at the [place] level of care (*CSW will initial, date and re-position this form in  chart as items are completed):  Yes   Patient/family provided with [place] Work Department's list of facilities offering this level of care within the geographic area requested by the patient (or if unable, by the patient's family).  Yes   Patient/family informed of their freedom to choose among providers that offer the needed level of care, that participate in Medicare, Medicaid or managed care program needed by the patient, have an available bed and are willing to accept the patient.  Yes   Patient/family informed of [facility]'s ownership interest in [facility] and [facility], as well as of the fact that they are under no obligation to receive care at these facilities.  PASRR submitted to EDS on 12/17/14     PASRR number received on 12/17/14     Existing PASRR number confirmed on       FL2 transmitted to all facilities in geographic area requested by pt/family on 12/17/14     FL2 transmitted to all facilities within larger geographic area on       Patient informed that his/her managed care company has contracts with or will negotiate with certain facilities, including the following:        Yes   Patient/family informed of bed offers received.  Patient chooses bed at [facility]     Physician recommends and patient chooses bed at      Patient to be  transferred to [facility] on 12/22/14.  Patient to be transferred to facility by [facility]     Patient family notified on 12/22/14 of transfer.  Name of family member notified:        PHYSICIAN       Additional Comment:    _______________________________________________ Standley Brooking, LCSW 12/22/2014, 9:10 AM

## 2014-12-22 NOTE — Discharge Summary (Signed)
Physician Discharge Summary  Patient ID: [name] MRN: [id number] DOB/AGE: 08/13/1934 80 y.o.  Admit date: 12/16/2014 Discharge date: 12/22/2014  Admission Diagnoses: Colon cancer  Discharge Diagnoses:  Active Problems:   Colon cancer   Discharged Condition: good  Hospital Course: Patient admitted after surgery.  He did well.  His diet was advanced as tolerated.  His foley was removed on POD 2, and he voided without difficulty.  By POD 6 he was tolerating a diet and having bowel function.  His pain was controlled with PO narcotics, and he was ambulating with PT.   Consults: None  Significant Diagnostic Studies: labs: cbc, chemistry  Treatments: IV hydration, analgesia: Vicodin and surgery: lap partial colectomy (left side)  Discharge Exam: Blood pressure 146/62, pulse 55, temperature 97.5 F (36.4 C), temperature source Oral, resp. rate 18, height 4\' 10"  (1.473 m), weight 51.71 kg (114 lb), SpO2 100 %. General appearance: alert and cooperative GI: normal findings: soft, non-tender Incision/Wound: clean, dry, intact  Disposition: SNF     Medication List    ASK your doctor about these medications        ENSURE NUTRA SHAKE HI-CAL PO  Take 1 Can by mouth 2 (two) times daily.     hydrochlorothiazide 12.5 MG capsule  Commonly known as:  MICROZIDE  Take 12.5 mg by mouth daily.     tiZANidine 4 MG tablet  Commonly known as:  ZANAFLEX  Take 4 mg by mouth 2 (two) times daily as needed for muscle spasms.     traMADol 50 MG tablet  Commonly known as:  ULTRAM  Take 50 mg by mouth 2 (two) times daily.           Follow-up Information    Follow up with Rosario Adie., MD. Schedule an appointment as soon as possible for a visit in 2 weeks.   Specialty:  General Surgery   Contact information:   [place] [id number] [phone]       Signed: Rosario Adie [birth year], 2:53 AM

## 2014-12-24 ENCOUNTER — Encounter: Payer: Self-pay | Admitting: Internal Medicine

## 2014-12-24 DIAGNOSIS — I10 Essential (primary) hypertension: Secondary | ICD-10-CM | POA: Insufficient documentation

## 2014-12-24 NOTE — Progress Notes (Signed)
Patient ID: [name], male   DOB: [birth year], 80 y.o.   MRN: [id number]    HISTORY AND PHYSICAL   DATE: 12/22/14  Location:  [place] of Service: SNF [phone])   Extended Emergency Contact Information Primary Emergency Contact: [place] Phone: [phone] Relation: Other Secondary Emergency Contact: [place] of [employer] Phone: [phone] Relation: Other  Advanced Directive information  FULL CODE  Chief Complaint  Patient presents with  . New Admit To SNF    HPI:  80 yo male seen today as a new admission into SNF following hospital stay for colon CA s/p lap left colectomy on 8/3rd. No post op complications noted on d/c but he is wearing [place] O2 at  SNF. Transverse colonic mass initially noted on colonoscopy in April 2016. Bx revealed colon CA  BP stable on HCTZ  He does c/o nausea but no emesis. Pain is controlled. No other concerns  Past Medical History  Diagnosis Date  . Alcohol dependence     Associated with thrombocytopenia, anemia, elevated LFTs, hypomagnesemia  . Seizures     Related to withdrawal, unclear if epilepsy present also  . HTN (hypertension)   . Dementia     Vascular and alcohol related.  . Arthritis   . Colon cancer     Past Surgical History  Procedure Laterality Date  . Orif femoral neck fracture w/ dhs      Patient Care Team: Nolene Ebbs, MD as PCP - General (Internal Medicine)  Social History   Social History  . Marital Status: Single    Spouse Name: N/A  . Number of Children: 2  . Years of Education: N/A   Occupational History  . truck driver Unemployed   Social History Main Topics  . Smoking status: Former Smoker -- 1.00 packs/day    Quit date: 12/14/1975  . Smokeless tobacco: Never Used  . Alcohol Use: 0.6 oz/week    1 Cans of beer, 0 Standard drinks or equivalent per week     Comment: USED TO DRINK BEER REGULARLY NOW DRINKS  ABOUT 2 BEERS PER MONTH  . Drug Use: Yes  . Sexual Activity: Not on file   Other Topics Concern  . Not on file   Social History Narrative   Lives in facility, divorced and has two children, in past was noted to check himself out and come back with altered mental status post alcohol binge.     reports that he quit smoking about 39 years ago. He has never used smokeless tobacco. He reports that he drinks about 0.6 oz of alcohol per week. He reports that he uses illicit drugs.  Family History  Problem Relation Age of Onset  . Colon cancer Neg Hx    No family status information on file.     There is no immunization history on file for this patient.  No Known Allergies  Medications: Patient's Medications  New Prescriptions   No medications on file  Previous Medications   HYDROCHLOROTHIAZIDE (MICROZIDE) 12.5 MG CAPSULE    Take 12.5 mg by mouth daily.   HYDROCODONE-ACETAMINOPHEN (NORCO/VICODIN) 5-325 MG PER TABLET    Take 1-2 tablets by mouth every 4 (four) hours as needed for moderate pain.   IBUPROFEN (ADVIL,MOTRIN) 200 MG TABLET    Take 1-2 tablets (200-400 mg total) by mouth every 4 (four) hours as needed for fever or headache.   NUTRITIONAL SUPPLEMENTS (ENSURE NUTRA SHAKE HI-CAL  PO)    Take 1 Can by mouth 2 (two) times daily.    TIZANIDINE (ZANAFLEX) 4 MG TABLET    Take 4 mg by mouth 2 (two) times daily as needed for muscle spasms.    TRAMADOL (ULTRAM) 50 MG TABLET    Take 50 mg by mouth 2 (two) times daily.  Modified Medications   No medications on file  Discontinued Medications   No medications on file    Review of Systems  Constitutional: Positive for fatigue. Negative for chills, activity change and appetite change.  HENT: Negative for sore throat and trouble swallowing.   Eyes: Negative for visual disturbance.  Respiratory: Negative for cough, chest tightness and shortness of breath.   Cardiovascular: Negative for chest pain, palpitations and leg swelling.    Gastrointestinal: Positive for nausea and abdominal pain. Negative for vomiting and blood in stool.  Genitourinary: Negative for urgency, frequency and difficulty urinating.  Musculoskeletal: Negative for arthralgias and gait problem.  Skin: Negative for rash.  Neurological: Negative for weakness and headaches.  Psychiatric/Behavioral: Negative for confusion and sleep disturbance. The patient is not nervous/anxious.     Filed Vitals:   12/22/14 1830  BP: 146/62  Pulse: 55  Temp: 97.5 F (36.4 C)  SpO2: 100%   There is no weight on file to calculate BMI.  Physical Exam  Constitutional: He is oriented to person, place, and time. He appears well-developed and well-nourished. No distress.  Lying in bed resting in NAD. [place] O2 intact  HENT:  Mouth/Throat: Oropharynx is clear and moist.  Eyes: Pupils are equal, round, and reactive to light. No scleral icterus.  Neck: Neck supple. Carotid bruit is not present. No thyromegaly present.  Cardiovascular: Normal rate, regular rhythm, normal heart sounds and intact distal pulses.  Exam reveals no gallop and no friction rub.   No murmur heard. no distal LE swelling. No calf TTP  Pulmonary/Chest: Effort normal and breath sounds normal. He has no wheezes. He has no rales. He exhibits no tenderness.  Abdominal: Soft. Bowel sounds are normal. He exhibits no distension, no abdominal bruit, no pulsatile midline mass and no mass. There is tenderness. There is no rebound and no guarding.  Surgical incisions without dehiscence. No d/c or redness  Lymphadenopathy:    He has no cervical adenopathy.  Neurological: He is alert and oriented to person, place, and time. He has normal reflexes.  Skin: Skin is warm and dry. No rash noted.  Psychiatric: He has a normal mood and affect. His behavior is normal. Judgment and thought content normal.     Labs reviewed: Admission on 12/16/2014, Discharged on 12/22/2014  Component Date Value Ref Range Status  .  Sodium 12/17/2014 134* 135 - 145 mmol/L Final  . Potassium 12/17/2014 4.5  3.5 - 5.1 mmol/L Final  . Chloride 12/17/2014 108  101 - 111 mmol/L Final  . CO2 12/17/2014 20* 22 - 32 mmol/L Final  . Glucose, Bld 12/17/2014 118* 65 - 99 mg/dL Final  . BUN 12/17/2014 7  6 - 20 mg/dL Final  . Creatinine, Ser 12/17/2014 1.05  0.61 - 1.24 mg/dL Final  . Calcium 12/17/2014 7.6* 8.9 - 10.3 mg/dL Final  . GFR calc non Af Amer 12/17/2014 >60  >60 mL/min Final  . GFR calc Af Amer 12/17/2014 >60  >60 mL/min Final   Comment: (NOTE) The eGFR has been calculated using the CKD EPI equation. This calculation has not been validated in all clinical situations. eGFR's persistently <60 mL/min signify  possible Chronic Kidney Disease.   . Anion gap 12/17/2014 6  5 - 15 Final  . WBC 12/17/2014 10.9* 4.0 - 10.5 K/uL Final  . RBC 12/17/2014 2.43* 4.22 - 5.81 MIL/uL Final  . Hemoglobin 12/17/2014 7.5* 13.0 - 17.0 g/dL Final  . HCT 12/17/2014 22.9* 39.0 - 52.0 % Final  . MCV 12/17/2014 94.2  78.0 - 100.0 fL Final  . MCH 12/17/2014 30.9  26.0 - 34.0 pg Final  . MCHC 12/17/2014 32.8  30.0 - 36.0 g/dL Final  . RDW 12/17/2014 12.1  11.5 - 15.5 % Final  . Platelets 12/17/2014 122* 150 - 400 K/uL Final  . Sodium 12/18/2014 133* 135 - 145 mmol/L Final  . Potassium 12/18/2014 5.4* 3.5 - 5.1 mmol/L Final   Comment: SLIGHT HEMOLYSIS HEMOLYSIS AT THIS LEVEL MAY AFFECT RESULT   . Chloride 12/18/2014 108  101 - 111 mmol/L Final  . CO2 12/18/2014 21* 22 - 32 mmol/L Final  . Glucose, Bld 12/18/2014 94  65 - 99 mg/dL Final  . BUN 12/18/2014 7  6 - 20 mg/dL Final  . Creatinine, Ser 12/18/2014 1.11  0.61 - 1.24 mg/dL Final  . Calcium 12/18/2014 7.7* 8.9 - 10.3 mg/dL Final  . GFR calc non Af Amer 12/18/2014 >60  >60 mL/min Final  . GFR calc Af Amer 12/18/2014 >60  >60 mL/min Final   Comment: (NOTE) The eGFR has been calculated using the CKD EPI equation. This calculation has not been validated in all clinical  situations. eGFR's persistently <60 mL/min signify possible Chronic Kidney Disease.   . Anion gap 12/18/2014 4* 5 - 15 Final  . WBC 12/18/2014 12.1* 4.0 - 10.5 K/uL Final  . RBC 12/18/2014 2.61* 4.22 - 5.81 MIL/uL Final  . Hemoglobin 12/18/2014 8.2* 13.0 - 17.0 g/dL Final  . HCT 12/18/2014 25.1* 39.0 - 52.0 % Final  . MCV 12/18/2014 96.2  78.0 - 100.0 fL Final  . MCH 12/18/2014 31.4  26.0 - 34.0 pg Final  . MCHC 12/18/2014 32.7  30.0 - 36.0 g/dL Final  . RDW 12/18/2014 12.1  11.5 - 15.5 % Final  . Platelets 12/18/2014 119* 150 - 400 K/uL Final   CONSISTENT WITH PREVIOUS RESULT  . Sodium 12/19/2014 135  135 - 145 mmol/L Final  . Potassium 12/19/2014 4.4  3.5 - 5.1 mmol/L Final   Comment: DELTA CHECK NOTED REPEATED TO VERIFY   . Chloride 12/19/2014 105  101 - 111 mmol/L Final  . CO2 12/19/2014 24  22 - 32 mmol/L Final  . Glucose, Bld 12/19/2014 93  65 - 99 mg/dL Final  . BUN 12/19/2014 8  6 - 20 mg/dL Final  . Creatinine, Ser 12/19/2014 1.03  0.61 - 1.24 mg/dL Final  . Calcium 12/19/2014 8.0* 8.9 - 10.3 mg/dL Final  . GFR calc non Af Amer 12/19/2014 >60  >60 mL/min Final  . GFR calc Af Amer 12/19/2014 >60  >60 mL/min Final   Comment: (NOTE) The eGFR has been calculated using the CKD EPI equation. This calculation has not been validated in all clinical situations. eGFR's persistently <60 mL/min signify possible Chronic Kidney Disease.   . Anion gap 12/19/2014 6  5 - 15 Final  . WBC 12/19/2014 10.9* 4.0 - 10.5 K/uL Final  . RBC 12/19/2014 2.49* 4.22 - 5.81 MIL/uL Final  . Hemoglobin 12/19/2014 7.4* 13.0 - 17.0 g/dL Final  . HCT 12/19/2014 23.6* 39.0 - 52.0 % Final  . MCV 12/19/2014 94.8  78.0 - 100.0 fL Final  .  MCH 12/19/2014 29.7  26.0 - 34.0 pg Final  . MCHC 12/19/2014 31.4  30.0 - 36.0 g/dL Final  . RDW 12/19/2014 12.0  11.5 - 15.5 % Final  . Platelets 12/19/2014 126* 150 - 400 K/uL Final  . WBC 12/22/2014 6.8  4.0 - 10.5 K/uL Final  . RBC 12/22/2014 2.56* 4.22 - 5.81  MIL/uL Final  . Hemoglobin 12/22/2014 8.1* 13.0 - 17.0 g/dL Final  . HCT 12/22/2014 24.7* 39.0 - 52.0 % Final  . MCV 12/22/2014 96.5  78.0 - 100.0 fL Final  . MCH 12/22/2014 31.6  26.0 - 34.0 pg Final  . MCHC 12/22/2014 32.8  30.0 - 36.0 g/dL Final  . RDW 12/22/2014 12.5  11.5 - 15.5 % Final  . Platelets 12/22/2014 183  150 - 400 K/uL Final  . Sodium 12/22/2014 134* 135 - 145 mmol/L Final  . Potassium 12/22/2014 4.6  3.5 - 5.1 mmol/L Final  . Chloride 12/22/2014 102  101 - 111 mmol/L Final  . CO2 12/22/2014 26  22 - 32 mmol/L Final  . Glucose, Bld 12/22/2014 104* 65 - 99 mg/dL Final  . BUN 12/22/2014 16  6 - 20 mg/dL Final  . Creatinine, Ser 12/22/2014 0.87  0.61 - 1.24 mg/dL Final  . Calcium 12/22/2014 8.3* 8.9 - 10.3 mg/dL Final  . GFR calc non Af Amer 12/22/2014 >60  >60 mL/min Final  . GFR calc Af Amer 12/22/2014 >60  >60 mL/min Final   Comment: (NOTE) The eGFR has been calculated using the CKD EPI equation. This calculation has not been validated in all clinical situations. eGFR's persistently <60 mL/min signify possible Chronic Kidney Disease.   Georgiann Hahn gap 12/22/2014 6  5 - 15 Final  Hospital Outpatient Visit on 12/14/2014  Component Date Value Ref Range Status  . WBC 12/14/2014 5.4  4.0 - 10.5 K/uL Final  . RBC 12/14/2014 2.99* 4.22 - 5.81 MIL/uL Final  . Hemoglobin 12/14/2014 9.4* 13.0 - 17.0 g/dL Final  . HCT 12/14/2014 29.1* 39.0 - 52.0 % Final  . MCV 12/14/2014 97.3  78.0 - 100.0 fL Final  . MCH 12/14/2014 31.4  26.0 - 34.0 pg Final  . MCHC 12/14/2014 32.3  30.0 - 36.0 g/dL Final  . RDW 12/14/2014 12.3  11.5 - 15.5 % Final  . Platelets 12/14/2014 168  150 - 400 K/uL Final  . ABO/RH(D) 12/14/2014 O POS   Final  . Antibody Screen 12/14/2014 NEG   Final  . Sample Expiration 12/14/2014 12/19/2014   Final  . Hgb A1c MFr Bld 12/14/2014 5.1  4.8 - 5.6 % Final   Comment: (NOTE)         Pre-diabetes: 5.7 - 6.4         Diabetes: >6.4         Glycemic control for adults  with diabetes: <7.0   . Mean Plasma Glucose 12/14/2014 100   Final   Comment: (NOTE) Performed At: [facility] [place], [place] [id number] Lindon Romp MD [id number]   . Sodium 12/14/2014 140  135 - 145 mmol/L Final  . Potassium 12/14/2014 4.4  3.5 - 5.1 mmol/L Final  . Chloride 12/14/2014 109  101 - 111 mmol/L Final  . CO2 12/14/2014 24  22 - 32 mmol/L Final  . Glucose, Bld 12/14/2014 99  65 - 99 mg/dL Final  . BUN 12/14/2014 12  6 - 20 mg/dL Final  . Creatinine, Ser 12/14/2014 1.07  0.61 - 1.24 mg/dL Final  .  Calcium 12/14/2014 8.9  8.9 - 10.3 mg/dL Final  . Total Protein 12/14/2014 7.2  6.5 - 8.1 g/dL Final  . Albumin 12/14/2014 3.8  3.5 - 5.0 g/dL Final  . AST 12/14/2014 39  15 - 41 U/L Final  . ALT 12/14/2014 28  17 - 63 U/L Final  . Alkaline Phosphatase 12/14/2014 51  38 - 126 U/L Final  . Total Bilirubin 12/14/2014 1.0  0.3 - 1.2 mg/dL Final  . GFR calc non Af Amer 12/14/2014 >60  >60 mL/min Final  . GFR calc Af Amer 12/14/2014 >60  >60 mL/min Final   Comment: (NOTE) The eGFR has been calculated using the CKD EPI equation. This calculation has not been validated in all clinical situations. eGFR's persistently <60 mL/min signify possible Chronic Kidney Disease.   . Anion gap 12/14/2014 7  5 - 15 Final    CLINICAL DATA: 80 year old male with recent diagnosis colonic Mass. Indeterminate lesion within the liver on computer tomography.  EXAM: MRI ABDOMEN WITHOUT AND WITH CONTRAST  TECHNIQUE: Multiplanar multisequence MR imaging of the abdomen was performed both before and after the administration of intravenous contrast.  CONTRAST: 5 mL Eovist.  BUN and creatinine were obtained on site at [facility] at [address]Results: BUN 2.4 mg/dL, Creatinine 1.0 mg/dL.  COMPARISON: CT 09/09/2014  FINDINGS: Lower chest: Lung bases are clear.  Hepatobiliary: Lesion of concern in the left lateral hepatic lobe measures  1.6 by 1.5 cm on image 26, series 12. This lesion demonstrates no post-contrast enhancement (series 13). Lesion is hyperintense on T2 weighted imaging.  No clear additional lesions are identified.  Multiple cholesterol stones collects in the fundus of the gallbladder (image 50, series 6).  Pancreas: Pancreas is normal. No ductal dilatation. No pancreatic inflammation.  Spleen: Normal spleen  Adrenals/urinary tract: Adrenal glands and kidneys are normal. The ureters and bladder normal.  Stomach/Bowel: Stomach, small bowel, appendix, and cecum are normal. Colonic mural thickening and mucosal enhancement in the transverse colon on image 29, series 12.  Vascular/Lymphatic: Abdominal aorta is normal caliber. There is no retroperitoneal or periportal lymphadenopathy. No pelvic lymphadenopathy.  Musculoskeletal: No aggressive osseous lesion.  Other: No peritoneal disease.  IMPRESSION: 1. Nonenhancing cystic lesion in the left hepatic lobe is consistent with benign hepatic cyst. 2. Cholelithiasis. 3. Colonic lesion in in the distal transverse colon is again noted.   Electronically Signed  By: Suzy Bouchard M.D.  On: 11/12/2014 14:19   Assessment/Plan   ICD-9-CM ICD-10-CM   1. Physical deconditioning 799.3 R53.81   2. Colon cancer s/p lap left colectomy 153.9 C18.9   3. Benign essential HTN 401.1 I10    --start zofran 59m po q8hrs prn N/V  --cont other meds as ordered  --PT/OT/ST as indicated  --pain control  --f/u with specialists as scheduled  --GOAL: short term rehab and d/c home when medically appropriate. Communicated with pt and nursing.  --will follow  Sholanda Croson S. CPerlie Gold PSelect [facility] [address] [phone] (Monday-Friday 8 AM - 5 PM) [phone] 5 PM and follow prompts

## 2014-12-25 ENCOUNTER — Encounter (HOSPITAL_COMMUNITY): Payer: Self-pay

## 2015-01-01 ENCOUNTER — Telehealth: Payer: Self-pay | Admitting: Hematology

## 2015-01-01 ENCOUNTER — Other Ambulatory Visit: Payer: Self-pay | Admitting: *Deleted

## 2015-01-01 DIAGNOSIS — C189 Malignant neoplasm of colon, unspecified: Secondary | ICD-10-CM

## 2015-01-01 NOTE — Telephone Encounter (Signed)
new patient appt-s/w patient [name] patient caregiver and gave np appt for 08/30 @ 10:30 w/Dr. Burr Medico.

## 2015-01-01 NOTE — Progress Notes (Signed)
Patient discussed in GI Conference 12/30/14-surgeon requesting medical oncology referral.

## 2015-01-05 ENCOUNTER — Non-Acute Institutional Stay (SKILLED_NURSING_FACILITY): Payer: Medicare Other | Admitting: Internal Medicine

## 2015-01-05 ENCOUNTER — Encounter: Payer: Self-pay | Admitting: Internal Medicine

## 2015-01-05 DIAGNOSIS — R5381 Other malaise: Secondary | ICD-10-CM

## 2015-01-05 DIAGNOSIS — C189 Malignant neoplasm of colon, unspecified: Secondary | ICD-10-CM

## 2015-01-05 DIAGNOSIS — I10 Essential (primary) hypertension: Secondary | ICD-10-CM

## 2015-01-05 DIAGNOSIS — F0390 Unspecified dementia without behavioral disturbance: Secondary | ICD-10-CM

## 2015-01-05 NOTE — Progress Notes (Signed)
Patient ID: Nichoals Heyde, male   DOB: 06/25/1934, 80 y.o.   MRN: [id number]    DATE: 01/05/15  Location:  [place] [facility]: SNF [phone])   Extended Emergency Contact Information Primary Emergency Contact: [place] of [place] Phone: [phone] Relation: Other Secondary Emergency Contact: [place] of [place] Mobile Phone: [phone] Relation: Other  Advanced Directive information  DNR  Chief Complaint  Patient presents with  . Discharge Note    HPI:  80 yo male seen today for d/c from SNF following short term rehab. He is s/p lap left colectomy on 12/16/14 due to colon CA. He has completed rehab and is now medically stable for d/c home. No home health or DME needs. He is no longer wearing [place] [employer]. He is a poor historian due to dementia. Hx obtained from chart. He has no concerns today  Past Medical History  Diagnosis Date  . Alcohol dependence     Associated with thrombocytopenia, anemia, elevated LFTs, hypomagnesemia  . Seizures     Related to withdrawal, unclear if epilepsy present also  . HTN (hypertension)   . Dementia     Vascular and alcohol related.  . Arthritis   . Colon cancer     Past Surgical History  Procedure Laterality Date  . Orif femoral neck fracture w/ dhs      Patient Care Team: Nolene Ebbs, MD as PCP - General (Internal Medicine)  Social History   Social History  . Marital Status: Single    Spouse Name: N/A  . Number of Children: 2  . Years of Education: N/A   Occupational History  . truck driver Unemployed   Social History Main Topics  . Smoking status: Former Smoker -- 1.00 packs/day    Quit date: 12/14/1975  . Smokeless tobacco: Never Used  . Alcohol Use: 0.6 oz/week    1 Cans of beer, 0 Standard drinks or equivalent per week     Comment: USED TO DRINK BEER REGULARLY NOW DRINKS ABOUT 2 BEERS PER MONTH  . Drug Use: Yes  . Sexual Activity: Not on file     Other Topics Concern  . Not on file   Social History Narrative   Lives in facility, divorced and has two children, in past was noted to check himself out and come back with altered mental status post alcohol binge.     reports that he quit smoking about 39 years ago. He has never used smokeless tobacco. He reports that he drinks about 0.6 oz of alcohol per week. He reports that he uses illicit drugs.   There is no immunization history on file for this patient.  No Known Allergies  Medications: Patient's Medications  New Prescriptions   No medications on file  Previous Medications   HYDROCHLOROTHIAZIDE (MICROZIDE) 12.5 MG CAPSULE    Take 12.5 mg by mouth daily.   HYDROCODONE-ACETAMINOPHEN (NORCO/VICODIN) 5-325 MG PER TABLET    Take 1-2 tablets by mouth every 4 (four) hours as needed for moderate pain.   IBUPROFEN (ADVIL,MOTRIN) 200 MG TABLET    Take 1-2 tablets (200-400 mg total) by mouth every 4 (four) hours as needed for fever or headache.   NUTRITIONAL SUPPLEMENTS (ENSURE NUTRA SHAKE HI-CAL PO)    Take 1 Can by mouth 2 (two) times daily.    TIZANIDINE (ZANAFLEX) 4 MG TABLET    Take 4 mg by mouth 2 (two) times daily as needed for muscle spasms.  TRAMADOL (ULTRAM) 50 MG TABLET    Take 50 mg by mouth 2 (two) times daily.  Modified Medications   No medications on file  Discontinued Medications   No medications on file    Review of Systems  Unable to perform ROS: Dementia    Filed Vitals:   01/05/15 1939  BP: 134/70  Pulse: 80  Temp: 97 F (36.1 C)  Weight: 113 lb (51.256 kg)  SpO2: 96%   Body mass index is 23.62 kg/(m^2).  Physical Exam  Constitutional: He does not appear ill. No distress.  Frail appearing in NAD. Sitting in bedside chair  Neurological: He is alert.  Skin: Skin is warm and dry. No rash noted.  Psychiatric: He has a normal mood and affect. His speech is normal and behavior is normal.     Labs reviewed: Admission on 12/16/2014, Discharged on  12/22/2014  Component Date Value Ref Range Status  . Sodium 12/17/2014 134* 135 - 145 mmol/L Final  . Potassium 12/17/2014 4.5  3.5 - 5.1 mmol/L Final  . Chloride 12/17/2014 108  101 - 111 mmol/L Final  . CO2 12/17/2014 20* 22 - 32 mmol/L Final  . Glucose, Bld 12/17/2014 118* 65 - 99 mg/dL Final  . BUN 12/17/2014 7  6 - 20 mg/dL Final  . Creatinine, Ser 12/17/2014 1.05  0.61 - 1.24 mg/dL Final  . Calcium 12/17/2014 7.6* 8.9 - 10.3 mg/dL Final  . GFR calc non Af Amer 12/17/2014 >60  >60 mL/min Final  . GFR calc Af Amer 12/17/2014 >60  >60 mL/min Final   Comment: (NOTE) The eGFR has been calculated using the CKD EPI equation. This calculation has not been validated in all clinical situations. eGFR's persistently <60 mL/min signify possible Chronic Kidney Disease.   . Anion gap 12/17/2014 6  5 - 15 Final  . WBC 12/17/2014 10.9* 4.0 - 10.5 K/uL Final  . RBC 12/17/2014 2.43* 4.22 - 5.81 MIL/uL Final  . Hemoglobin 12/17/2014 7.5* 13.0 - 17.0 g/dL Final  . HCT 12/17/2014 22.9* 39.0 - 52.0 % Final  . MCV 12/17/2014 94.2  78.0 - 100.0 fL Final  . MCH 12/17/2014 30.9  26.0 - 34.0 pg Final  . MCHC 12/17/2014 32.8  30.0 - 36.0 g/dL Final  . RDW 12/17/2014 12.1  11.5 - 15.5 % Final  . Platelets 12/17/2014 122* 150 - 400 K/uL Final  . Sodium 12/18/2014 133* 135 - 145 mmol/L Final  . Potassium 12/18/2014 5.4* 3.5 - 5.1 mmol/L Final   Comment: SLIGHT HEMOLYSIS HEMOLYSIS AT THIS LEVEL MAY AFFECT RESULT   . Chloride 12/18/2014 108  101 - 111 mmol/L Final  . CO2 12/18/2014 21* 22 - 32 mmol/L Final  . Glucose, Bld 12/18/2014 94  65 - 99 mg/dL Final  . BUN 12/18/2014 7  6 - 20 mg/dL Final  . Creatinine, Ser 12/18/2014 1.11  0.61 - 1.24 mg/dL Final  . Calcium 12/18/2014 7.7* 8.9 - 10.3 mg/dL Final  . GFR calc non Af Amer 12/18/2014 >60  >60 mL/min Final  . GFR calc Af Amer 12/18/2014 >60  >60 mL/min Final   Comment: (NOTE) The eGFR has been calculated using the CKD EPI equation. This calculation  has not been validated in all clinical situations. eGFR's persistently <60 mL/min signify possible Chronic Kidney Disease.   . Anion gap 12/18/2014 4* 5 - 15 Final  . WBC 12/18/2014 12.1* 4.0 - 10.5 K/uL Final  . RBC 12/18/2014 2.61* 4.22 - 5.81 MIL/uL Final  . Hemoglobin  12/18/2014 8.2* 13.0 - 17.0 g/dL Final  . HCT 12/18/2014 25.1* 39.0 - 52.0 % Final  . MCV 12/18/2014 96.2  78.0 - 100.0 fL Final  . MCH 12/18/2014 31.4  26.0 - 34.0 pg Final  . MCHC 12/18/2014 32.7  30.0 - 36.0 g/dL Final  . RDW 12/18/2014 12.1  11.5 - 15.5 % Final  . Platelets 12/18/2014 119* 150 - 400 K/uL Final   CONSISTENT WITH PREVIOUS RESULT  . Sodium 12/19/2014 135  135 - 145 mmol/L Final  . Potassium 12/19/2014 4.4  3.5 - 5.1 mmol/L Final   Comment: DELTA CHECK NOTED REPEATED TO VERIFY   . Chloride 12/19/2014 105  101 - 111 mmol/L Final  . CO2 12/19/2014 24  22 - 32 mmol/L Final  . Glucose, Bld 12/19/2014 93  65 - 99 mg/dL Final  . BUN 12/19/2014 8  6 - 20 mg/dL Final  . Creatinine, Ser 12/19/2014 1.03  0.61 - 1.24 mg/dL Final  . Calcium 12/19/2014 8.0* 8.9 - 10.3 mg/dL Final  . GFR calc non Af Amer 12/19/2014 >60  >60 mL/min Final  . GFR calc Af Amer 12/19/2014 >60  >60 mL/min Final   Comment: (NOTE) The eGFR has been calculated using the CKD EPI equation. This calculation has not been validated in all clinical situations. eGFR's persistently <60 mL/min signify possible Chronic Kidney Disease.   . Anion gap 12/19/2014 6  5 - 15 Final  . WBC 12/19/2014 10.9* 4.0 - 10.5 K/uL Final  . RBC 12/19/2014 2.49* 4.22 - 5.81 MIL/uL Final  . Hemoglobin 12/19/2014 7.4* 13.0 - 17.0 g/dL Final  . HCT 12/19/2014 23.6* 39.0 - 52.0 % Final  . MCV 12/19/2014 94.8  78.0 - 100.0 fL Final  . MCH 12/19/2014 29.7  26.0 - 34.0 pg Final  . MCHC 12/19/2014 31.4  30.0 - 36.0 g/dL Final  . RDW 12/19/2014 12.0  11.5 - 15.5 % Final  . Platelets 12/19/2014 126* 150 - 400 K/uL Final  . WBC 12/22/2014 6.8  4.0 - 10.5 K/uL Final    . RBC 12/22/2014 2.56* 4.22 - 5.81 MIL/uL Final  . Hemoglobin 12/22/2014 8.1* 13.0 - 17.0 g/dL Final  . HCT 12/22/2014 24.7* 39.0 - 52.0 % Final  . MCV 12/22/2014 96.5  78.0 - 100.0 fL Final  . MCH 12/22/2014 31.6  26.0 - 34.0 pg Final  . MCHC 12/22/2014 32.8  30.0 - 36.0 g/dL Final  . RDW 12/22/2014 12.5  11.5 - 15.5 % Final  . Platelets 12/22/2014 183  150 - 400 K/uL Final  . Sodium 12/22/2014 134* 135 - 145 mmol/L Final  . Potassium 12/22/2014 4.6  3.5 - 5.1 mmol/L Final  . Chloride 12/22/2014 102  101 - 111 mmol/L Final  . CO2 12/22/2014 26  22 - 32 mmol/L Final  . Glucose, Bld 12/22/2014 104* 65 - 99 mg/dL Final  . BUN 12/22/2014 16  6 - 20 mg/dL Final  . Creatinine, Ser 12/22/2014 0.87  0.61 - 1.24 mg/dL Final  . Calcium 12/22/2014 8.3* 8.9 - 10.3 mg/dL Final  . GFR calc non Af Amer 12/22/2014 >60  >60 mL/min Final  . GFR calc Af Amer 12/22/2014 >60  >60 mL/min Final   Comment: (NOTE) The eGFR has been calculated using the CKD EPI equation. This calculation has not been validated in all clinical situations. eGFR's persistently <60 mL/min signify possible Chronic Kidney Disease.   Georgiann Hahn gap 12/22/2014 6  5 - 15 Final  Hospital Outpatient Visit on  12/14/2014  Component Date Value Ref Range Status  . WBC 12/14/2014 5.4  4.0 - 10.5 K/uL Final  . RBC 12/14/2014 2.99* 4.22 - 5.81 MIL/uL Final  . Hemoglobin 12/14/2014 9.4* 13.0 - 17.0 g/dL Final  . HCT 12/14/2014 29.1* 39.0 - 52.0 % Final  . MCV 12/14/2014 97.3  78.0 - 100.0 fL Final  . MCH 12/14/2014 31.4  26.0 - 34.0 pg Final  . MCHC 12/14/2014 32.3  30.0 - 36.0 g/dL Final  . RDW 12/14/2014 12.3  11.5 - 15.5 % Final  . Platelets 12/14/2014 168  150 - 400 K/uL Final  . ABO/RH(D) 12/14/2014 O POS   Final  . Antibody Screen 12/14/2014 NEG   Final  . Sample Expiration 12/14/2014 12/19/2014   Final  . Hgb A1c MFr Bld 12/14/2014 5.1  4.8 - 5.6 % Final   Comment: (NOTE)         Pre-diabetes: 5.7 - 6.4         Diabetes: >6.4          Glycemic control for adults with diabetes: <7.0   . Mean Plasma Glucose 12/14/2014 100   Final   Comment: (NOTE) Performed At: [facility] [place], [place] [id number] Lindon Romp MD [id number]   . Sodium 12/14/2014 140  135 - 145 mmol/L Final  . Potassium 12/14/2014 4.4  3.5 - 5.1 mmol/L Final  . Chloride 12/14/2014 109  101 - 111 mmol/L Final  . CO2 12/14/2014 24  22 - 32 mmol/L Final  . Glucose, Bld 12/14/2014 99  65 - 99 mg/dL Final  . BUN 12/14/2014 12  6 - 20 mg/dL Final  . Creatinine, Ser 12/14/2014 1.07  0.61 - 1.24 mg/dL Final  . Calcium 12/14/2014 8.9  8.9 - 10.3 mg/dL Final  . Total Protein 12/14/2014 7.2  6.5 - 8.1 g/dL Final  . Albumin 12/14/2014 3.8  3.5 - 5.0 g/dL Final  . AST 12/14/2014 39  15 - 41 U/L Final  . ALT 12/14/2014 28  17 - 63 U/L Final  . Alkaline Phosphatase 12/14/2014 51  38 - 126 U/L Final  . Total Bilirubin 12/14/2014 1.0  0.3 - 1.2 mg/dL Final  . GFR calc non Af Amer 12/14/2014 >60  >60 mL/min Final  . GFR calc Af Amer 12/14/2014 >60  >60 mL/min Final   Comment: (NOTE) The eGFR has been calculated using the CKD EPI equation. This calculation has not been validated in all clinical situations. eGFR's persistently <60 mL/min signify possible Chronic Kidney Disease.   . Anion gap 12/14/2014 7  5 - 15 Final    No results found.   Assessment/Plan   ICD-9-CM ICD-10-CM   1. Colon cancer 153.9 C18.9   2. Benign essential HTN 401.1 I10   3. Physical deconditioning 799.3 R53.81   4.       Dementia (vascular and alcohol related)    Patient is being discharged with home health services:  NONE  Patient is being discharged with the following durable medical equipment:  NONE  Patient has been advised to f/u with their PCP in 1-2 weeks to bring them up to date on their rehab stay.  They were provided with a 30 day supply of scripts for prescription medications and refills must be obtained from their PCP.  TIME  SPENT (MINUTES): 25  Tressa Maldonado S. Perlie Gold  [facility] [address] [place], [place] [address] [phone]  Cell (Monday-Friday 8 AM - 5 PM) [phone] After 5 PM and follow prompts

## 2015-01-11 NOTE — Progress Notes (Signed)
[name]  [phone]) [phone] [phone]  Clinic New Consult Note   Patient Care Team: Nolene Ebbs, MD as PCP - General (Internal Medicine) 01/12/2015   Referring physician: Dr. [name]  CHIEF COMPLAINTS/PURPOSE OF CONSULTATION:  Stage IIIB colon cancer   Oncology History   Colon cancer   Staging form: Colon and Rectum, AJCC 7th Edition     Pathologic stage from 12/16/2014: Stage IIIB (T4a, N1b, cM0) - Signed by Truitt Merle, MD on 01/11/2015       Colon cancer   09/09/2014 Imaging mass like area of mucosal thickening in the distal transverse colon, no distant metastatic disease    09/10/2014 Tumor Marker CEA  2.6 (normal)   12/16/2014 Initial Diagnosis Colon cancer   12/16/2014 Surgery Left descending colon segmental resection   12/16/2014 Pathology Results CT CAP showed invasive adenocarcinoma, G2, (+) LVI, 3/32 node positive, pT4aN1b, margins negative, MMR normal     HISTORY OF PRESENTING ILLNESS:  [name] 80 y.o. male with past medical history of advanced dementa,  Hypertension, history of alcohol abuse, is here because of recently diagnosed stage III colon cancer.  He presents to the clinic with his  caregiver and power of attorney Suanne Marker and her daughter Alyson Ingles.   He initially presented with weight loss in early this year , was evaluated by his primary care physician  and was referred to GI Dr. Fuller Plan.  He underwent colonoscopy on 09/07/2014, which showed a mass in the transverse colon , biopsy showed adenocarcinoma.  She was referred to colorectal surgeon Dr. Marcello Moores, and underwent left hemicolectomy on 12/16/2014.  He has moderate to severe dementia,  Very poor short memory, still recognize people he knows, does not communicate much. Before the surgery , he lived with [place], was able to take care of himself,  And walk around independently without difficulty.  After the surgery, he was discharged to a assisted-living,  He spends most of time in  wheelchair,  His appetite and eating is moderate,  He denies any pain, nausea, bloating or other symptoms. He has moderate fatigue. His bowel movement is normal.   MEDICAL HISTORY:  Past Medical History  Diagnosis Date  . Alcohol dependence     Associated with thrombocytopenia, anemia, elevated LFTs, hypomagnesemia  . Seizures     Related to withdrawal, unclear if epilepsy present also  . HTN (hypertension)   . Dementia     Vascular and alcohol related.  . Arthritis   . Colon cancer     SURGICAL HISTORY: Past Surgical History  Procedure Laterality Date  . Orif femoral neck fracture w/ dhs      SOCIAL HISTORY: Social History   Social History  . Marital Status: Single    Spouse Name: N/A  . Number of Children: 2  . Years of Education: N/A   Occupational History  . truck driver Unemployed   Social History Main Topics  . Smoking status: Former Smoker -- 1.00 packs/day    Quit date: 12/14/1975  . Smokeless tobacco: Never Used  . Alcohol Use: 0.6 oz/week    1 Cans of beer, 0 Standard drinks or equivalent per week     Comment: USED TO DRINK BEER REGULARLY NOW DRINKS ABOUT 2 BEERS PER MONTH  . Drug Use: Yes  . Sexual Activity: Not on file   Other Topics Concern  . Not on file   Social History Narrative   Lives in facility, divorced and has two children, in past was noted  to check himself out and come back with altered mental status post alcohol binge.   Currently at [facility] in [place]: Family History  Problem Relation Age of Onset  . Colon cancer Neg Hx     ALLERGIES:  has No Known Allergies.  MEDICATIONS:  Current Outpatient Prescriptions  Medication Sig Dispense Refill  . hydrochlorothiazide (MICROZIDE) 12.5 MG capsule Take 12.5 mg by mouth daily.    Marland Kitchen ibuprofen (ADVIL,MOTRIN) 200 MG tablet Take 1-2 tablets (200-400 mg total) by mouth every 4 (four) hours as needed for fever or headache. 30 tablet 0  . Nutritional Supplements  (ENSURE NUTRA SHAKE HI-CAL PO) Take 1 Can by mouth 2 (two) times daily.     Marland Kitchen tiZANidine (ZANAFLEX) 4 MG tablet Take 4 mg by mouth 2 (two) times daily as needed for muscle spasms.   2  . traMADol (ULTRAM) 50 MG tablet Take 50 mg by mouth 2 (two) times daily.     No current facility-administered medications for this visit.    REVIEW OF SYSTEMS:   Constitutional: Denies fevers, chills or abnormal night sweats Eyes: Denies blurriness of vision, double vision or watery eyes Ears, nose, mouth, throat, and face: Denies mucositis or sore throat Respiratory: Denies cough, dyspnea or wheezes Cardiovascular: Denies palpitation, chest discomfort or lower extremity swelling Gastrointestinal:  Denies nausea, heartburn or change in bowel habits Skin: Denies abnormal skin rashes Lymphatics: Denies new lymphadenopathy or easy bruising Neurological:Denies numbness, tingling or new weaknesses Behavioral/Psych: Mood is stable, no new changes  All other systems were reviewed with the patient and are negative.  PHYSICAL EXAMINATION: ECOG PERFORMANCE STATUS: 3 - Symptomatic, >50% confined to bed  Filed Vitals:   01/12/15 1105  BP: 133/63  Pulse: 77  Temp: 99.7 F (37.6 C)  Resp: 17   Filed Weights   01/12/15 1105  Weight: 112 lb 12.8 oz (51.166 kg)    GENERAL:alert, no distress and comfortable SKIN: skin color, texture, turgor are normal, no rashes or significant lesions EYES: normal, conjunctiva are pink and non-injected, sclera clear OROPHARYNX:no exudate, no erythema and lips, buccal mucosa, and tongue normal  NECK: supple, thyroid normal size, non-tender, without nodularity LYMPH:  no palpable lymphadenopathy in the cervical, axillary or inguinal LUNGS: clear to auscultation and percussion with normal breathing effort HEART: regular rate & rhythm and no murmurs and no lower extremity edema ABDOMEN:abdomen soft, non-tender and normal bowel sounds Musculoskeletal:no cyanosis of digits and no  clubbing  PSYCH: alert & oriented x 3 with fluent speech NEURO: no focal motor/sensory deficits  LABORATORY DATA:  I have reviewed the data as listed Lab Results  Component Value Date   WBC 6.8 12/22/2014   HGB 8.1* 12/22/2014   HCT 24.7* 12/22/2014   MCV 96.5 12/22/2014   PLT 183 12/22/2014    Recent Labs  09/07/14 1546 12/14/14 1350  12/18/14 0411 12/19/14 0520 12/22/14 0439  NA 137 140  < > 133* 135 134*  K 4.5 4.4  < > 5.4* 4.4 4.6  CL 106 109  < > 108 105 102  CO2 21 24  < > 21* 24 26  GLUCOSE 88 99  < > 94 93 104*  BUN 19 12  < > _0 CREATININE 0.79 1.07  < > 1.11 1.03 0.87  CALCIUM 9.2 8.9  < > 7.7* 8.0* 8.3*  GFRNONAA  --  >60  < > >60 >60 >60  GFRAA  --  >60  < > >  60 >60 >60  PROT 7.8 7.2  --   --   --   --   ALBUMIN 3.8 3.8  --   --   --   --   AST 33 39  --   --   --   --   ALT 24 28  --   --   --   --   ALKPHOS 81 51  --   --   --   --   BILITOT 0.8 1.0  --   --   --   --   < > = values in this interval not displayed.  CEA  Status: Finalresult Visible to patient:  Not Released Nextappt: 01/12/2015 at 10:30 AM in Oncology ([facility] Financial Counselor) Dx:  Colonic mass       Notes Recorded by Sheri L Jones, RN on 09/09/2014 at 10:14 AM Patient's caregiver notified Notes Recorded by Malcolm T Stark, MD on 09/09/2014 at 8:35 AM normal     Ref Range 4mo ago    CEA 0.0 - 5.0 ng/mL 2.6         PATHOLOGY REPORT 12/16/2014 Diagnosis Colon, segmental resection for tumor, left descending - INVASIVE ADENOCARCINOMA, SEE COMMENT. - POSITIVE FOR LYMPH VASCULAR INVASION - TUMOR INVADES INTO PERICOLONIC SOFT TISSUE AND INVOLVES VISCERAL PERITONEUM. - THREE LYMPH NODES, POSITIVE FOR METASTATIC TUMOR (3/32). - SURGICAL MARGINS, NEGATIVE FOR TUMOR  Specimen: Descending colon. Procedure: Resection Tumor site: Proximal colon. Specimen integrity: Intact. Macroscopic intactness of mesorectum: N/A Macroscopic tumor perforation:  Absent. Invasive tumor: Maximum size: 2.7 cm Histologic type(s): Adenocarcinoma. Histologic grade and differentiation: Grade 2 G1: well differentiated/low grade G2: moderately differentiated/low grade G3: poorly differentiated/high grade G4: undifferentiated/high grade Type of polyp in which invasive carcinoma arose: Adenoma with high grade dysplasia. Microscopic extension of invasive tumor: Tumor involves visceral peritoneum (serosa). Lymph-Vascular invasion: Present. Peri-neural invasion: Absent. Tumor deposit(s) (discontinuous extramural extension): Absent. Resection margins: Proximal margin: Negative Distal margin: Negative. Circumferential (radial) (posterior ascending, posterior descending; lateral and posterior mid-rectum; and entire lower 1/3 rectum): Negative. Mesenteric margin (sigmoid and transverse): Negative. Distance closest margin (if all above margins negative): 3.1 cm (proximal) Trans-anal resection margins only: Deep margin: N/A Mucosal Margin: N/A Distance closest mucosal margin (if negative): N/A Treatment effect (neo-adjuvant therapy): None. Additional polyp(s): Tubular adenoma (x1); negative for high grade dysplasia. Non-neoplastic findings: None. Lymph nodes: number examined - 32; number positive: 3 Pathologic Staging: pT4a, pN1b, pMX Ancillary studies: Per the [place] [facility] Working [employer] Guidelines, tumor will be submitted for both microsatellite instability by PCR and mismatch repair protein expression by immunohistochemistry. The results will be reported in an addendum. Comments: None. (CRR:gt, 12/17/14)  ADDITIONAL INFORMATION: Mismatch Repair (MMR) Protein Immunohistochemistry (IHC) IHC Expression Result: MLH1: Preserved nuclear expression (greater 50% tumor expression) MSH2: Preserved nuclear expression (greater 50% tumor expression) MSH6: Preserved nuclear expression (greater 50% tumor expression) PMS2: Preserved nuclear expression  (greater 50% tumor expression) * Internal control demonstrates intact nuclear expression Interpretation: NORMAL There is preserved expression  MSI-STABLE     RADIOGRAPHIC STUDIES: I have personally reviewed the radiological images as listed and agreed with the findings in the report.  CT chest, abdomen and peivis w contrast 09/09/2014 IMPRESSION: 1. Mass-like area of mucosal thickening in the distal transverse colon shortly before the splenic flexure, likely corresponding to the recently recognized colonic neoplasm. No surrounding lymphadenopathy, and no definite findings to strongly suggest metastatic disease in the chest, abdomen or pelvis. 2. There is an indeterminate lesion in   segment 2 of the liver which is favored to represent a proteinaceous hepatic cyst. This could be definitively characterized with MRI of the abdomen with and without IV gadolinium if clinically appropriate. 3. Cholelithiasis without evidence of acute cholecystitis at this time. 4. Morphologic changes in the liver suggestive of cirrhosis. 5. Multiple tiny peribronchovascular nodules and ground-glass attenuation micronodules in the lungs, most evident in the right lower lobe, favored to be of infectious or inflammatory etiology. Attention on followup studies is recommended. 6. Multiple vertebral body compression fractures, as above, which do not appear to be acute. 7. Additional incidental findings, as above.  LIVER MRI w wo contrast 11/12/2014 IMPRESSION: 1. Nonenhancing cystic lesion in the left hepatic lobe is consistent with benign hepatic cyst. 2. Cholelithiasis. 3. Colonic lesion in in the distal transverse colon is again noted.   ASSESSMENT & PLAN:   80 year old African-American male, with past medical history of dementia,  Alcohol abuse, hypertension,  Who was recently diagnosed with stage III colon cancer.  1.  Adenocarcinoma of transverse colon, pT4aN1bM0, stage IIIB, G2, MSI stable  - I  reviewed  His surgical path findings in great detail with patient, and his power of attorney and caregiver Suanne Marker and Stage manager.  - We  Reviewed the natural history of colon cancer and discussed the high risk of cancer recurrence after surgery for stage III colon cancer. - the standard recommendation for stage III colon cancer is adjuvant chemotherapy, to reduce risk of cancer recurrence after surgery. - however, giving his advanced dementia,  and poor performance status,  I do not recommend adjuvant chemotherapy.   - we discussed the surveillance plan. Since  I would not offer chemotherapy even if he has recurrent cancer ,  So I would not do scan surveillance.  I'll see him every 3-6 months with lab, including CBC, CMP and CEA. - if he develops any significant clinical symptoms, suspicious for cancer recurrence, I'll obtain a CT scan , to see if he would benefit from any palliative radiation or surgery.  - The above  Plan were discussed with patient's power of attorney,  And they're in agreement.  2. Anemia - Secondary to colon cancer and possible iron deficient anemia - We'll repeat his CBC and iron studies today.  If these are low level was low Marcello Moores will start iron supplement, by oral pill, or iv feraheme if very low level or intolerance to oral iron.   Plan -lab today and next visit  -RTC in 3 months -I will call his lab results and see if he needs iron supplement   Orders Placed This Encounter  Procedures  . CBC with Differential    Standing Status: Standing     Number of Occurrences: 10     Standing Expiration Date: 01/11/2018  . Comprehensive metabolic panel (Cmet) - [facility]    Standing Status: Standing     Number of Occurrences: 10     Standing Expiration Date: 01/11/2018  . CEA    Standing Status: Standing     Number of Occurrences: 10     Standing Expiration Date: 01/11/2018  . Ferritin    Standing Status: Future     Number of Occurrences:      Standing Expiration Date:  01/12/2016  . Iron and TIBC [facility]    Standing Status: Future     Number of Occurrences:      Standing Expiration Date: 01/12/2016    All questions were answered. The patient knows to call the clinic  with any problems, questions or concerns. I spent 55 minutes counseling the patient face to face. The total time spent in the appointment was 60 minutes and more than 50% was on counseling.     Truitt Merle, MD 01/12/2015 12:13 PM

## 2015-01-12 ENCOUNTER — Ambulatory Visit: Payer: Medicare Other

## 2015-01-12 ENCOUNTER — Encounter: Payer: Self-pay | Admitting: *Deleted

## 2015-01-12 ENCOUNTER — Telehealth: Payer: Self-pay | Admitting: Hematology

## 2015-01-12 ENCOUNTER — Ambulatory Visit (HOSPITAL_BASED_OUTPATIENT_CLINIC_OR_DEPARTMENT_OTHER): Payer: Medicare Other

## 2015-01-12 ENCOUNTER — Ambulatory Visit (HOSPITAL_BASED_OUTPATIENT_CLINIC_OR_DEPARTMENT_OTHER): Payer: Medicare Other | Admitting: Hematology

## 2015-01-12 ENCOUNTER — Encounter: Payer: Self-pay | Admitting: Hematology

## 2015-01-12 VITALS — BP 133/63 | HR 77 | Temp 99.7°F | Resp 17 | Ht <= 58 in | Wt 112.8 lb

## 2015-01-12 DIAGNOSIS — D630 Anemia in neoplastic disease: Secondary | ICD-10-CM | POA: Diagnosis not present

## 2015-01-12 DIAGNOSIS — F0390 Unspecified dementia without behavioral disturbance: Secondary | ICD-10-CM | POA: Diagnosis not present

## 2015-01-12 DIAGNOSIS — F1021 Alcohol dependence, in remission: Secondary | ICD-10-CM | POA: Diagnosis not present

## 2015-01-12 DIAGNOSIS — C184 Malignant neoplasm of transverse colon: Secondary | ICD-10-CM | POA: Diagnosis present

## 2015-01-12 DIAGNOSIS — I10 Essential (primary) hypertension: Secondary | ICD-10-CM | POA: Diagnosis not present

## 2015-01-12 DIAGNOSIS — C189 Malignant neoplasm of colon, unspecified: Secondary | ICD-10-CM

## 2015-01-12 LAB — COMPREHENSIVE METABOLIC PANEL (CC13)
ALT: 38 U/L (ref 0–55)
AST: 35 U/L — ABNORMAL HIGH (ref 5–34)
Albumin: 3.8 g/dL (ref 3.5–5.0)
Alkaline Phosphatase: 84 U/L (ref 40–150)
Anion Gap: 8 mEq/L (ref 3–11)
BUN: 26.1 mg/dL — ABNORMAL HIGH (ref 7.0–26.0)
CHLORIDE: 101 meq/L (ref 98–109)
CO2: 27 meq/L (ref 22–29)
Calcium: 10 mg/dL (ref 8.4–10.4)
Creatinine: 1.3 mg/dL (ref 0.7–1.3)
EGFR: 62 mL/min/{1.73_m2} — AB (ref >90)
Glucose: 94 mg/dl (ref 70–140)
POTASSIUM: 5.2 meq/L — AB (ref 3.5–5.1)
SODIUM: 137 meq/L (ref 136–145)
Total Bilirubin: 0.61 mg/dL (ref 0.20–1.20)
Total Protein: 8.4 g/dL — ABNORMAL HIGH (ref 6.4–8.3)

## 2015-01-12 LAB — FERRITIN CHCC: Ferritin: 1201 ng/ml — ABNORMAL HIGH (ref 22–316)

## 2015-01-12 LAB — CBC WITH DIFFERENTIAL/PLATELET
BASO%: 1.1 % (ref 0.0–2.0)
BASOS ABS: 0.1 10*3/uL (ref 0.0–0.1)
EOS ABS: 0.2 10*3/uL (ref 0.0–0.5)
EOS%: 3.4 % (ref 0.0–7.0)
HCT: 33.7 % — ABNORMAL LOW (ref 38.4–49.9)
HGB: 11 g/dL — ABNORMAL LOW (ref 13.0–17.1)
LYMPH%: 31.2 % (ref 14.0–49.0)
MCH: 31.4 pg (ref 27.2–33.4)
MCHC: 32.5 g/dL (ref 32.0–36.0)
MCV: 96.6 fL (ref 79.3–98.0)
MONO#: 0.5 10*3/uL (ref 0.1–0.9)
MONO%: 8.3 % (ref 0.0–14.0)
NEUT%: 56 % (ref 39.0–75.0)
NEUTROS ABS: 3.4 10*3/uL (ref 1.5–6.5)
Platelets: 162 10*3/uL (ref 140–400)
RBC: 3.49 10*6/uL — AB (ref 4.20–5.82)
RDW: 13.1 % (ref 11.0–14.6)
WBC: 6 10*3/uL (ref 4.0–10.3)
lymph#: 1.9 10*3/uL (ref 0.9–3.3)

## 2015-01-12 LAB — IRON AND TIBC CHCC
%SAT: 45 % (ref 20–55)
Iron: 159 ug/dL (ref 42–163)
TIBC: 354 ug/dL (ref 202–409)
UIBC: 195 ug/dL (ref 117–376)

## 2015-01-12 NOTE — Progress Notes (Signed)
Oncology Nurse Navigator Documentation  Oncology Nurse Navigator Flowsheets 01/12/2015  Referral date to RadOnc/MedOnc 12/30/2014  Navigator Encounter Type Initial MedOnc  Patient Visit Type Medonc  Treatment Phase Review results-discuss tx options  Barriers/Navigation Needs Education  Education Newly Diagnosed Cancer Education  Support Groups/Services [place] for caregivers  Time Spent with Patient 76  Met with patient and his two [place] (family friends) during new patient visit. Explained the role of the GI Nurse Navigator and provided New Patient Packet with information on: 1. Colon cancer 2. Support groups 3. Advanced Directives--already has this in place 4. Fall Safety Plan--suggested he obtain walker Answered questions, reviewed current treatment plan using TEACH back and provided emotional support. Provided copy of current treatment plan, which is for comfort care/quality of life. Care givers agree and feel chemotherapy would cause him more harm than good at this time. He will return to [facility]. Will fax office note/labs to facility when available.  Merceda Elks, RN, BSN GI Oncology [place]

## 2015-01-12 NOTE — Telephone Encounter (Signed)
per po fto sch pt appt-gave pt copy of avs-sent back to lab °

## 2015-01-12 NOTE — Progress Notes (Signed)
Checked in new pt with no financial concerns.  Pt has 2 insurances so financial assistance may not be needed but he has my card for any billing questions or concerns. ° °

## 2015-01-13 LAB — CEA: CEA: 1.4 ng/mL (ref 0.0–5.0)

## 2015-01-22 ENCOUNTER — Telehealth: Payer: Self-pay | Admitting: *Deleted

## 2015-01-22 NOTE — Telephone Encounter (Signed)
Oncology Nurse Navigator Documentation  Oncology Nurse Navigator Flowsheets 01/22/2015  Referral date to RadOnc/MedOnc -  Navigator Encounter Type Telephone - 1 week F/U  Patient Visit Type -  Treatment Phase -  Barriers/Navigation Needs No barriers at this time  Education -  Interventions Faxed 8/30 labs to [facility] and notified POA [place] of results. She reports [facility] is doing the same. No change.  Support Groups/Services -  Time Spent with Patient -

## 2015-03-07 ENCOUNTER — Emergency Department (HOSPITAL_COMMUNITY)
Admission: EM | Admit: 2015-03-07 | Discharge: 2015-03-07 | Disposition: A | Discharge status: Home / Self Care | Payer: Medicare Other | Attending: Emergency Medicine | Admitting: Emergency Medicine

## 2015-03-07 ENCOUNTER — Emergency Department (HOSPITAL_COMMUNITY): Payer: Medicare Other

## 2015-03-07 ENCOUNTER — Encounter (HOSPITAL_COMMUNITY): Payer: Self-pay | Admitting: Emergency Medicine

## 2015-03-07 DIAGNOSIS — S2231XA Fracture of one rib, right side, initial encounter for closed fracture: Secondary | ICD-10-CM

## 2015-03-07 DIAGNOSIS — W19XXXA Unspecified fall, initial encounter: Secondary | ICD-10-CM

## 2015-03-07 DIAGNOSIS — S299XXA Unspecified injury of thorax, initial encounter: Secondary | ICD-10-CM | POA: Diagnosis present

## 2015-03-07 DIAGNOSIS — Z85038 Personal history of other malignant neoplasm of large intestine: Secondary | ICD-10-CM | POA: Diagnosis not present

## 2015-03-07 DIAGNOSIS — W500XXA Accidental hit or strike by another person, initial encounter: Secondary | ICD-10-CM | POA: Diagnosis not present

## 2015-03-07 DIAGNOSIS — M25551 Pain in right hip: Secondary | ICD-10-CM

## 2015-03-07 DIAGNOSIS — S79911A Unspecified injury of right hip, initial encounter: Secondary | ICD-10-CM | POA: Insufficient documentation

## 2015-03-07 DIAGNOSIS — S0083XA Contusion of other part of head, initial encounter: Secondary | ICD-10-CM | POA: Diagnosis not present

## 2015-03-07 DIAGNOSIS — I10 Essential (primary) hypertension: Secondary | ICD-10-CM | POA: Diagnosis not present

## 2015-03-07 DIAGNOSIS — Y998 Other external cause status: Secondary | ICD-10-CM | POA: Insufficient documentation

## 2015-03-07 DIAGNOSIS — F0390 Unspecified dementia without behavioral disturbance: Secondary | ICD-10-CM | POA: Insufficient documentation

## 2015-03-07 DIAGNOSIS — M1990 Unspecified osteoarthritis, unspecified site: Secondary | ICD-10-CM | POA: Diagnosis not present

## 2015-03-07 DIAGNOSIS — S2241XA Multiple fractures of ribs, right side, initial encounter for closed fracture: Secondary | ICD-10-CM | POA: Diagnosis not present

## 2015-03-07 DIAGNOSIS — Y92128 Other place in nursing home as the place of occurrence of the external cause: Secondary | ICD-10-CM | POA: Diagnosis not present

## 2015-03-07 DIAGNOSIS — Y9389 Activity, other specified: Secondary | ICD-10-CM | POA: Insufficient documentation

## 2015-03-07 DIAGNOSIS — Z79899 Other long term (current) drug therapy: Secondary | ICD-10-CM | POA: Insufficient documentation

## 2015-03-07 DIAGNOSIS — Z87891 Personal history of nicotine dependence: Secondary | ICD-10-CM | POA: Insufficient documentation
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
## 2015-03-07 NOTE — ED Notes (Signed)
[facility] called for transport back to [facility].  

## 2015-03-07 NOTE — Discharge Instructions (Signed)
You may take Tylenol as prescribed over the counter and use ice for pain relief. Continue to use your incentive spirometer daily.  Follow up with your primary care provider in 5 days. Please return to the Emergency Department if symptoms worsen or new onset of fever, chills, difficulty breathing, cough, swelling, weakness, numbness, tingling.
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
## 2015-03-07 NOTE — ED Notes (Signed)
Bed: BL10 Expected date:  Expected time:  Means of arrival:  Comments: Hip Injury

## 2015-03-16 ENCOUNTER — Telehealth: Payer: Self-pay | Admitting: Hematology

## 2015-03-16 ENCOUNTER — Other Ambulatory Visit (HOSPITAL_BASED_OUTPATIENT_CLINIC_OR_DEPARTMENT_OTHER): Payer: Medicare Other

## 2015-03-16 ENCOUNTER — Encounter: Payer: Self-pay | Admitting: Hematology

## 2015-03-16 ENCOUNTER — Ambulatory Visit (HOSPITAL_BASED_OUTPATIENT_CLINIC_OR_DEPARTMENT_OTHER): Payer: Medicare Other | Admitting: Hematology

## 2015-03-16 VITALS — BP 137/86 | HR 93 | Temp 97.3°F | Resp 18 | Ht <= 58 in | Wt 118.5 lb

## 2015-03-16 DIAGNOSIS — R079 Chest pain, unspecified: Secondary | ICD-10-CM

## 2015-03-16 DIAGNOSIS — C189 Malignant neoplasm of colon, unspecified: Secondary | ICD-10-CM

## 2015-03-16 DIAGNOSIS — R569 Unspecified convulsions: Secondary | ICD-10-CM | POA: Diagnosis not present

## 2015-03-16 DIAGNOSIS — S2231XA Fracture of one rib, right side, initial encounter for closed fracture: Secondary | ICD-10-CM

## 2015-03-16 DIAGNOSIS — D630 Anemia in neoplastic disease: Secondary | ICD-10-CM

## 2015-03-16 DIAGNOSIS — I10 Essential (primary) hypertension: Secondary | ICD-10-CM | POA: Diagnosis not present

## 2015-03-16 DIAGNOSIS — C184 Malignant neoplasm of transverse colon: Secondary | ICD-10-CM

## 2015-03-16 DIAGNOSIS — F1010 Alcohol abuse, uncomplicated: Secondary | ICD-10-CM

## 2015-03-16 DIAGNOSIS — F0390 Unspecified dementia without behavioral disturbance: Secondary | ICD-10-CM

## 2015-03-16 DIAGNOSIS — S2239XA Fracture of one rib, unspecified side, initial encounter for closed fracture: Secondary | ICD-10-CM | POA: Insufficient documentation

## 2015-03-16 DIAGNOSIS — D649 Anemia, unspecified: Secondary | ICD-10-CM

## 2015-03-16 LAB — COMPREHENSIVE METABOLIC PANEL (CC13)
ALT: 23 U/L (ref 0–55)
AST: 30 U/L (ref 5–34)
Albumin: 3.4 g/dL — ABNORMAL LOW (ref 3.5–5.0)
Alkaline Phosphatase: 123 U/L (ref 40–150)
Anion Gap: 11 mEq/L (ref 3–11)
BILIRUBIN TOTAL: 0.5 mg/dL (ref 0.20–1.20)
BUN: 22.5 mg/dL (ref 7.0–26.0)
CALCIUM: 9.7 mg/dL (ref 8.4–10.4)
CO2: 25 mEq/L (ref 22–29)
CREATININE: 1.3 mg/dL (ref 0.7–1.3)
Chloride: 103 mEq/L (ref 98–109)
EGFR: 63 mL/min/{1.73_m2} — ABNORMAL LOW (ref >90)
Glucose: 101 mg/dl (ref 70–140)
Potassium: 4 mEq/L (ref 3.5–5.1)
Sodium: 139 mEq/L (ref 136–145)
TOTAL PROTEIN: 8.4 g/dL — AB (ref 6.4–8.3)

## 2015-03-16 LAB — CBC WITH DIFFERENTIAL/PLATELET
BASO%: 0.4 % (ref 0.0–2.0)
Basophils Absolute: 0 10*3/uL (ref 0.0–0.1)
EOS%: 3.4 % (ref 0.0–7.0)
Eosinophils Absolute: 0.3 10*3/uL (ref 0.0–0.5)
HEMATOCRIT: 34.9 % — AB (ref 38.4–49.9)
HEMOGLOBIN: 11 g/dL — AB (ref 13.0–17.1)
LYMPH%: 34.9 % (ref 14.0–49.0)
MCH: 29.5 pg (ref 27.2–33.4)
MCHC: 31.5 g/dL — AB (ref 32.0–36.0)
MCV: 93.5 fL (ref 79.3–98.0)
MONO#: 0.6 10*3/uL (ref 0.1–0.9)
MONO%: 7.7 % (ref 0.0–14.0)
NEUT%: 53.6 % (ref 39.0–75.0)
NEUTROS ABS: 4.5 10*3/uL (ref 1.5–6.5)
PLATELETS: 224 10*3/uL (ref 140–400)
RBC: 3.73 10*6/uL — ABNORMAL LOW (ref 4.20–5.82)
RDW: 13.1 % (ref 11.0–14.6)
WBC: 8.4 10*3/uL (ref 4.0–10.3)
lymph#: 2.9 10*3/uL (ref 0.9–3.3)
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
## 2015-03-16 NOTE — Addendum Note (Signed)
Addended by: Elray Buba LE on: 03/16/2015 04:36 PM   Modules accepted: Orders, Medications

## 2015-03-16 NOTE — Telephone Encounter (Signed)
per pof to sch pt appt-gave pt copy of avs °

## 2015-03-17 LAB — CEA: CEA: 1.6 ng/mL (ref 0.0–5.0)

## 2015-03-19 ENCOUNTER — Telehealth: Payer: Self-pay | Admitting: *Deleted

## 2015-03-19 NOTE — Telephone Encounter (Signed)
Spoke with Crystal at [facility] and informed her re:  CMET and CEA results were normal.  Faxed results to [facility] to place on pt's chart as per her request. [place]    Phone    [phone]    ;    Fax     [phone].

## 2015-06-15 ENCOUNTER — Telehealth: Payer: Self-pay | Admitting: Hematology

## 2015-06-15 NOTE — Telephone Encounter (Signed)
His assisted living fac. Called to cancel his appointment due to transport

## 2015-06-17 ENCOUNTER — Ambulatory Visit: Payer: Self-pay | Admitting: Hematology

## 2015-06-17 ENCOUNTER — Other Ambulatory Visit: Payer: Self-pay

## 2015-07-13 ENCOUNTER — Encounter: Payer: Self-pay | Admitting: Gastroenterology

## 2015-07-20 ENCOUNTER — Encounter: Payer: Self-pay | Admitting: Gastroenterology

## 2015-09-30 ENCOUNTER — Encounter: Payer: Self-pay | Admitting: Gastroenterology

## 2015-11-11 ENCOUNTER — Ambulatory Visit (AMBULATORY_SURGERY_CENTER): Payer: Self-pay | Admitting: *Deleted

## 2015-11-11 VITALS — Ht 60.0 in | Wt 141.0 lb

## 2015-11-11 DIAGNOSIS — Z85038 Personal history of other malignant neoplasm of large intestine: Secondary | ICD-10-CM

## 2015-11-11 MED ORDER — NA SULFATE-K SULFATE-MG SULF 17.5-3.13-1.6 GM/177ML PO SOLN: ORAL | Status: DC

## 2015-11-11 NOTE — Progress Notes (Signed)
No allergies to eggs or soy. No problems with anesthesia.  Pt given Emmi instructions for colonoscopy  No oxygen use  No diet drug use  Pt has dementia: [name] pt's POA present during PV

## 2015-11-25 ENCOUNTER — Ambulatory Visit (AMBULATORY_SURGERY_CENTER): Payer: Medicare Other | Admitting: Gastroenterology

## 2015-11-25 ENCOUNTER — Encounter: Payer: Self-pay | Admitting: Gastroenterology

## 2015-11-25 VITALS — BP 128/71 | HR 71 | Temp 98.4°F | Resp 11 | Ht 60.0 in | Wt 141.0 lb

## 2015-11-25 DIAGNOSIS — Z85038 Personal history of other malignant neoplasm of large intestine: Secondary | ICD-10-CM

## 2015-11-25 DIAGNOSIS — D123 Benign neoplasm of transverse colon: Secondary | ICD-10-CM

## 2015-11-25 MED ORDER — SODIUM CHLORIDE 0.9 % IV SOLN: 500.0000 mL | INTRAVENOUS | Status: DC

## 2015-11-25 NOTE — Progress Notes (Signed)
Called to room to assist during endoscopic procedure.  Patient ID and intended procedure confirmed with present staff. Received instructions for my participation in the procedure from the performing physician.  

## 2015-11-25 NOTE — Op Note (Signed)
[place] Patient Name: [name] Procedure Date: 11/25/2015 11:13 AM MRN: [id number] Endoscopist: Ladene Artist , MD Age: 81 Referring MD:  Date of Birth: 06-18-1934 Gender: Male Account #: [id number] Procedure:                Colonoscopy Indications:              High risk colon cancer surveillance: Personal                            history of colon cancer Medicines:                Monitored Anesthesia Care Procedure:                Pre-Anesthesia Assessment:                           - Prior to the procedure, a History and Physical                            was performed, and patient medications and                            allergies were reviewed. The patient's tolerance of                            previous anesthesia was also reviewed. The risks                            and benefits of the procedure and the sedation                            options and risks were discussed with the patient.                            All questions were answered, and informed consent                            was obtained. Prior Anticoagulants: The patient has                            taken no previous anticoagulant or antiplatelet                            agents. ASA Grade Assessment: II - A patient with                            mild systemic disease. After reviewing the risks                            and benefits, the patient was deemed in                            satisfactory condition to undergo the procedure.  After obtaining informed consent, the colonoscope                            was passed under direct vision. Throughout the                            procedure, the patient's blood pressure, pulse, and                            oxygen saturations were monitored continuously. The                            Model PCF-H190L [id number]) scope was introduced                            through the anus and advanced to the the  cecum,                            identified by appendiceal orifice and ileocecal                            valve. The colonoscopy was performed without                            difficulty. The patient tolerated the procedure                            well. The quality of the bowel preparation was                            adequate after extensive lavage and suctioning. The                            ileocecal valve, appendiceal orifice, and rectum                            were photographed. Scope In: 11:22:09 AM Scope Out: 11:33:27 AM Scope Withdrawal Time: 0 hours 10 minutes 10 seconds  Total Procedure Duration: 0 hours 11 minutes 18 seconds  Findings:                 The digital rectal exam was normal.                           There was evidence of a prior end-to-end                            colo-colonic anastomosis in the sigmoid colon. This                            was patent and was characterized by healthy                            appearing mucosa. The anastomosis was traversed.  A 5 mm polyp was found in the transverse colon. The                            polyp was sessile. The polyp was removed with a                            cold biopsy forceps. Resection and retrieval were                            complete.                           The exam was otherwise normal throughout the                            examined colon.                           The retroflexed view of the distal rectum and anal                            verge was normal and showed no anal or rectal                            abnormalities. Complications:            No immediate complications. Estimated Blood Loss:     Estimated blood loss: none. Impression:               - Patent end-to-end colo-colonic anastomosis,                            characterized by healthy appearing mucosa.                           - One 5 mm polyp in the transverse colon, removed                             with a cold biopsy forceps. Resected and retrieved.                           - The distal rectum and anal verge are normal on                            retroflexion view. Recommendation:           - Patient has a contact number available for                            emergencies. The signs and symptoms of potential                            delayed complications were discussed with the                            patient. Return  to normal activities tomorrow.                            Written discharge instructions were provided to the                            patient.                           - Resume previous diet.                           - Continue present medications.                           - Await pathology results.                           - No repeat colonoscopy is planned due to                            comorbidities and age. Ladene Artist, MD 11/25/2015 11:41:58 AM This report has been signed electronically.

## 2015-11-25 NOTE — Progress Notes (Signed)
I spoke with Lavinia Sharps, case manager at [facility].  She went over hx that she had and also meds and last dose of meds with me for Mr. Tetlow.  She reported pt drank his prep last night, but did not drink his prep this am.  She said pt was agitated because he could not eat, and did not drink the prep this am.  She said pt goes to the bathroom himself and she is not sure what the last BM looks like.  Pt took HCTZ and tramadol this am @ 9:00.  NPO after 9:00 this am.  Last ate solid food Tues night at 5:30 pm.  Carolee Rota staff brought pt to [facility] and pt's HCPOA, Christ Kick is here with pt at [facility].  Either Mariann Laster or Florina Ou will take pt back to [facility].  I reported this info to Dr. Fuller Plan, Gilmer Mor, CRNA and Margaretmary Eddy, RN recovery room.  maw

## 2015-11-25 NOTE — Patient Instructions (Signed)
Impressions/recommendations:  [place] (handout given)  YOU HAD AN ENDOSCOPIC PROCEDURE TODAY AT THE [facility]:   Refer to the procedure report that was given to you for any specific questions about what was found during the examination.  If the procedure report does not answer your questions, please call your gastroenterologist to clarify.  If you requested that your care partner not be given the details of your procedure findings, then the procedure report has been included in a sealed envelope for you to review at your convenience later.  YOU SHOULD EXPECT: Some feelings of bloating in the abdomen. Passage of more gas than usual.  Walking can help get rid of the air that was put into your GI tract during the procedure and reduce the bloating. If you had a lower endoscopy (such as a colonoscopy or flexible sigmoidoscopy) you may notice spotting of blood in your stool or on the toilet paper. If you underwent a bowel prep for your procedure, you may not have a normal bowel movement for a few days.  Please Note:  You might notice some irritation and congestion in your nose or some drainage.  This is from the oxygen used during your procedure.  There is no need for concern and it should clear up in a day or so.  SYMPTOMS TO REPORT IMMEDIATELY:   Following lower endoscopy (colonoscopy or flexible sigmoidoscopy):  Excessive amounts of blood in the stool  Significant tenderness or worsening of abdominal pains  Swelling of the abdomen that is new, acute  Fever of 100F or higher  For urgent or emergent issues, a gastroenterologist can be reached at any hour by calling [phone].   DIET: Your first meal following the procedure should be a small meal and then it is ok to progress to your normal diet. Heavy or fried foods are harder to digest and may make you feel nauseous or bloated.  Likewise, meals heavy in dairy and vegetables can increase bloating.  Drink plenty of fluids but you  should avoid alcoholic beverages for 24 hours.  ACTIVITY:  You should plan to take it easy for the rest of today and you should NOT DRIVE or use heavy machinery until tomorrow (because of the sedation medicines used during the test).    FOLLOW UP: Our staff will call the number listed on your records the next business day following your procedure to check on you and address any questions or concerns that you may have regarding the information given to you following your procedure. If we do not reach you, we will leave a message.  However, if you are feeling well and you are not experiencing any problems, there is no need to return our call.  We will assume that you have returned to your regular daily activities without incident.  If any biopsies were taken you will be contacted by phone or by letter within the next 1-3 weeks.  Please call us at [phone] if you have not heard about the biopsies in 3 weeks.    SIGNATURES/CONFIDENTIALITY: You and/or your care partner have signed paperwork which will be entered into your electronic medical record.  These signatures attest to the fact that that the information above on your After Visit Summary has been reviewed and is understood.  Full responsibility of the confidentiality of this discharge information lies with you and/or your care-partner. 

## 2015-11-25 NOTE — Progress Notes (Signed)
Pt has demntia and is a poor historian id why I contacted Lavinia Sharps, case Freight forwarder at [employer].  maw

## 2015-11-25 NOTE — Progress Notes (Signed)
To PACU pt awake and alert, report to RN 

## 2015-11-26 ENCOUNTER — Telehealth: Payer: Self-pay | Admitting: *Deleted

## 2015-11-26 NOTE — Telephone Encounter (Signed)
  Follow up Call-  Call back number 11/25/2015 09/07/2014  Post procedure Call Back phone  # [place] - ask for care giver [phone]  Caregiver  Permission to leave phone message Yes Yes     Patient questions:  Do you have a fever, pain , or abdominal swelling? No. Pain Score  0 *  Have you tolerated food without any problems? Yes.    Have you been able to return to your normal activities? Yes.    Do you have any questions about your discharge instructions: Diet   No. Medications  No. Follow up visit  No.  Do you have questions or concerns about your Care? No.  Actions: * If pain score is 4 or above: No action needed, pain <4.

## 2015-12-01 ENCOUNTER — Encounter: Payer: Self-pay | Admitting: Gastroenterology

## 2018-11-07 ENCOUNTER — Other Ambulatory Visit: Payer: Self-pay

## 2018-11-07 ENCOUNTER — Emergency Department (HOSPITAL_COMMUNITY): Payer: Medicare Other

## 2018-11-07 ENCOUNTER — Inpatient Hospital Stay (HOSPITAL_COMMUNITY)
Admission: EM | Admit: 2018-11-07 | Discharge: 2018-11-11 | DRG: 178 | Disposition: A | Discharge status: Skilled Nursing Facility | Payer: Medicare Other | Source: Skilled Nursing Facility | Attending: Internal Medicine | Admitting: Internal Medicine

## 2018-11-07 DIAGNOSIS — Z79899 Other long term (current) drug therapy: Secondary | ICD-10-CM

## 2018-11-07 DIAGNOSIS — Z9049 Acquired absence of other specified parts of digestive tract: Secondary | ICD-10-CM

## 2018-11-07 DIAGNOSIS — Z87898 Personal history of other specified conditions: Secondary | ICD-10-CM

## 2018-11-07 DIAGNOSIS — F1021 Alcohol dependence, in remission: Secondary | ICD-10-CM | POA: Diagnosis present

## 2018-11-07 DIAGNOSIS — G4089 Other seizures: Secondary | ICD-10-CM | POA: Diagnosis present

## 2018-11-07 DIAGNOSIS — I10 Essential (primary) hypertension: Secondary | ICD-10-CM | POA: Diagnosis present

## 2018-11-07 DIAGNOSIS — Z87891 Personal history of nicotine dependence: Secondary | ICD-10-CM

## 2018-11-07 DIAGNOSIS — D509 Iron deficiency anemia, unspecified: Secondary | ICD-10-CM | POA: Diagnosis present

## 2018-11-07 DIAGNOSIS — F0150 Vascular dementia without behavioral disturbance: Secondary | ICD-10-CM | POA: Diagnosis present

## 2018-11-07 DIAGNOSIS — C184 Malignant neoplasm of transverse colon: Secondary | ICD-10-CM | POA: Diagnosis present

## 2018-11-07 DIAGNOSIS — J690 Pneumonitis due to inhalation of food and vomit: Secondary | ICD-10-CM | POA: Diagnosis not present

## 2018-11-07 DIAGNOSIS — Z85038 Personal history of other malignant neoplasm of large intestine: Secondary | ICD-10-CM

## 2018-11-07 DIAGNOSIS — R109 Unspecified abdominal pain: Secondary | ICD-10-CM

## 2018-11-07 DIAGNOSIS — K8020 Calculus of gallbladder without cholecystitis without obstruction: Secondary | ICD-10-CM | POA: Diagnosis present

## 2018-11-07 DIAGNOSIS — F0280 Dementia in other diseases classified elsewhere without behavioral disturbance: Secondary | ICD-10-CM | POA: Diagnosis present

## 2018-11-07 DIAGNOSIS — R112 Nausea with vomiting, unspecified: Secondary | ICD-10-CM | POA: Diagnosis present

## 2018-11-07 DIAGNOSIS — K219 Gastro-esophageal reflux disease without esophagitis: Secondary | ICD-10-CM | POA: Diagnosis present

## 2018-11-07 DIAGNOSIS — Z20828 Contact with and (suspected) exposure to other viral communicable diseases: Secondary | ICD-10-CM | POA: Diagnosis present

## 2018-11-07 DIAGNOSIS — F0390 Unspecified dementia without behavioral disturbance: Secondary | ICD-10-CM

## 2018-11-07 DIAGNOSIS — D539 Nutritional anemia, unspecified: Secondary | ICD-10-CM | POA: Diagnosis present

## 2018-11-07 LAB — CBC WITH DIFFERENTIAL/PLATELET
Abs Immature Granulocytes: 0.02 10*3/uL (ref 0.00–0.07)
Basophils Absolute: 0 10*3/uL (ref 0.0–0.1)
Basophils Relative: 1 %
Eosinophils Absolute: 0.1 10*3/uL (ref 0.0–0.5)
Eosinophils Relative: 1 %
HCT: 38.6 % — ABNORMAL LOW (ref 39.0–52.0)
Hemoglobin: 11.8 g/dL — ABNORMAL LOW (ref 13.0–17.0)
Immature Granulocytes: 0 %
Lymphocytes Relative: 34 %
Lymphs Abs: 2.5 10*3/uL (ref 0.7–4.0)
MCH: 30.6 pg (ref 26.0–34.0)
MCHC: 30.6 g/dL (ref 30.0–36.0)
MCV: 100.3 fL — ABNORMAL HIGH (ref 80.0–100.0)
Monocytes Absolute: 0.6 10*3/uL (ref 0.1–1.0)
Monocytes Relative: 8 %
Neutro Abs: 4.1 10*3/uL (ref 1.7–7.7)
Neutrophils Relative %: 56 %
Platelets: 161 10*3/uL (ref 150–400)
RBC: 3.85 MIL/uL — ABNORMAL LOW (ref 4.22–5.81)
RDW: 11.6 % (ref 11.5–15.5)
WBC: 7.3 10*3/uL (ref 4.0–10.5)
nRBC: 0 % (ref 0.0–0.2)

## 2018-11-07 LAB — COMPREHENSIVE METABOLIC PANEL
ALT: 22 U/L (ref 0–44)
AST: 26 U/L (ref 15–41)
Albumin: 3.7 g/dL (ref 3.5–5.0)
Alkaline Phosphatase: 52 U/L (ref 38–126)
Anion gap: 7 (ref 5–15)
BUN: 20 mg/dL (ref 8–23)
CO2: 26 mmol/L (ref 22–32)
Calcium: 8.9 mg/dL (ref 8.9–10.3)
Chloride: 108 mmol/L (ref 98–111)
Creatinine, Ser: 0.97 mg/dL (ref 0.61–1.24)
GFR calc Af Amer: >60 mL/min (ref >60)
GFR calc non Af Amer: >60 mL/min (ref >60)
Glucose, Bld: 91 mg/dL (ref 70–99)
Potassium: 4.3 mmol/L (ref 3.5–5.1)
Sodium: 141 mmol/L (ref 135–145)
Total Bilirubin: 0.5 mg/dL (ref 0.3–1.2)
Total Protein: 7.2 g/dL (ref 6.5–8.1)

## 2018-11-07 LAB — LACTIC ACID, PLASMA: Lactic Acid, Venous: 1.8 mmol/L (ref 0.5–1.9)

## 2018-11-07 LAB — LIPASE, BLOOD: Lipase: 26 U/L (ref 11–51)

## 2018-11-07 LAB — SARS CORONAVIRUS 2 BY RT PCR (HOSPITAL ORDER, PERFORMED IN ~~LOC~~ HOSPITAL LAB): SARS Coronavirus 2: NEGATIVE

## 2018-11-07 MED ORDER — IOHEXOL 300 MG/ML  SOLN
100.0000 mL | Freq: Once | INTRAMUSCULAR | Status: AC | PRN
  Administered 2018-11-07: 23:00:00 100 mL via INTRAVENOUS

## 2018-11-07 MED ORDER — ONDANSETRON HCL 4 MG/2ML IJ SOLN
4.0000 mg | Freq: Once | INTRAMUSCULAR | Status: AC
  Administered 2018-11-07: 20:00:00 4 mg via INTRAVENOUS
  Filled 2018-11-07: qty 2

## 2018-11-07 MED ORDER — SODIUM CHLORIDE 0.9 % IV BOLUS
500.0000 mL | Freq: Once | INTRAVENOUS | Status: AC
  Administered 2018-11-07: 20:00:00 500 mL via INTRAVENOUS

## 2018-11-07 MED ORDER — SODIUM CHLORIDE 0.9 % IV SOLN
3.0000 g | Freq: Once | INTRAVENOUS | Status: AC
  Administered 2018-11-07: 3 g via INTRAVENOUS
  Filled 2018-11-07: qty 3

## 2018-11-07 MED ORDER — SODIUM CHLORIDE (PF) 0.9 % IJ SOLN
INTRAMUSCULAR | Status: AC
  Filled 2018-11-07: qty 50

## 2018-11-07 NOTE — ED Notes (Signed)
Unable to collect lab work. Phlebotomy called.

## 2018-11-07 NOTE — ED Notes (Signed)
Pt contact, [phone] Daughter [name]

## 2018-11-07 NOTE — ED Triage Notes (Signed)
Per EMS, Pt is coming from [facility]. Pt has had several emesis episodes today, staff also noticed low grade fever (99.8). Gave pt PO zofran, however vomited following administration. Pt A&O 2x which is base line. Pt complains of body aches.

## 2018-11-07 NOTE — ED Provider Notes (Signed)
Medical screening examination/treatment/procedure(s) were conducted as a shared visit with non-physician practitioner(s) and myself.  I personally evaluated the patient during the encounter.  EKG Interpretation  Date/Time:  Thursday November 07 2018 20:40:21 EDT Ventricular Rate:  74 PR Interval:  164 QRS Duration: 76 QT Interval:  416 QTC Calculation: 461 R Axis:   6 Text Interpretation:  Normal sinus rhythm Normal ECG Confirmed by Fredia Sorrow [phone]) on 11/07/2018 8:52:12 PM   Patient seen by me along with physician assistant.  Patient was sent in from [facility].  Patient's had several episodes of emesis today.  Staff noted a low-grade fever with a temp at 99.  Was given Zofran.  Patient was reported from [facility] is being at baseline.  The patient was not very talkative when he came in.  Patient did complain of generalized body aches.  Patient's COVID testing was negative lactic acid was not elevated.  Complete metabolic panel without any significant abnormalities.  No leukocytosis on CBC mild anemia.  Chest x-ray raise some concerns for possible aspiration pneumonia clinically as we were concerned about.  Patient will also get CT scan of abdomen and pelvis due to the frequent episodes of emesis.  This will also further delineate the lower lung fields.  We both feel the patient warrants admission for possible aspiration pneumonia.   Fredia Sorrow, MD 11/07/18 [phone]

## 2018-11-07 NOTE — ED Notes (Signed)
Bed: WA05 Expected date:  Expected time:  Means of arrival:  Comments: EMS-vomiting 

## 2018-11-07 NOTE — ED Notes (Signed)
EKK given to EDP.Zackowski,MD., for review.

## 2018-11-07 NOTE — ED Provider Notes (Signed)
[place] DEPT Provider Note   CSN: [id number] Arrival date & time: 11/07/18  1843    History   Chief Complaint Chief Complaint  Patient presents with  . Emesis    HPI Makye Radle is a 84 y.o. male.     Areon Cocuzza is a 84 y.o. male with a history of hypertension, colon cancer, alcohol dependence and dementia, who presents to the emergency department for evaluation of vomiting and low-grade fever.  Patient presents from [facility] living facility.  They report he was at his baseline and has some chronic issues with vomiting and reflux but today was vomiting several times, they attempted to give him Zofran but he was unable to keep this down.  Patient reported some generalized body aches but denied any other focal symptoms.  Temp was 99.8 at facility and he was sent for further evaluation.  Patient is able to provide minimal history but he denies focal chest pain, shortness of breath or abdominal pain, reports occasional cough.  He is vomiting during my evaluation.  He denies focal abdominal pain.  Per Facility patient is alert and oriented x2 which is his baseline.  Level 5 caveat: Dementia     Past Medical History:  Diagnosis Date  . Alcohol dependence ([place])    Associated with thrombocytopenia, anemia, elevated LFTs, hypomagnesemia  . Anemia   . Arthritis   . Colon cancer ([place])   . Dementia    Vascular and alcohol related.  Marland Kitchen HTN (hypertension)   . Seizures ([place])    Related to withdrawal, no seizure in years    Patient Active Problem List   Diagnosis Date Noted  . Aspiration pneumonitis ([place]) 11/08/2018  . Dementia ([place])   . History of alcohol use disorder   . History of seizures   . Nausea and vomiting   . Rib fracture 03/16/2015  . Anemia 03/16/2015  . Benign essential HTN 12/24/2014  . 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[place]) 12/16/2014    Past Surgical History:  Procedure Laterality Date  . COLONOSCOPY    . HEMICOLECTOMY  12/2014   . ORIF FEMORAL NECK FRACTURE W/ DHS          Home Medications    Prior to Admission medications   Medication Sig Start Date End Date Taking? Authorizing Provider  acetaminophen (TYLENOL) 500 MG tablet Take 500 mg by mouth 2 (two) times a day. Not to exceed 2000 mg in 24 hours.  Call MD if fever is above 101F    Yes [provider]  alum & mag hydroxide-simeth (MAALOX/MYLANTA) 200-200-20 MG/5ML suspension Take 30 mLs by mouth every 6 (six) hours as needed for indigestion or heartburn. Reported on 11/11/2015   Yes [provider]  divalproex (DEPAKOTE SPRINKLE) 125 MG capsule Take 125 mg by mouth 2 (two) times daily.  09/23/18  Yes [provider]  guaifenesin (ROBITUSSIN) 100 MG/5ML syrup Take 200 mg by mouth 4 (four) times daily as needed for cough. Reported on 11/11/2015   Yes [provider]  latanoprost (XALATAN) 0.005 % ophthalmic solution Place 1 drop into both eyes at bedtime. (if patient will tolerate) 10/08/18  Yes [provider]  loperamide (LOPERAMIDE A-D) 2 MG tablet Take 2 mg by mouth 4 (four) times daily as needed for diarrhea or loose stools. Reported on 11/11/2015   Yes [provider]  magnesium hydroxide (MILK OF MAGNESIA) 400 MG/5ML suspension Take 30 mLs by mouth at bedtime as needed for mild constipation. Reported  on 11/11/2015   Yes [provider]  neomycin-bacitracin-polymyxin (NEOSPORIN) ointment Apply 1 application topically See admin instructions. Reported on 11/11/2015   Yes [provider]  ondansetron (ZOFRAN) 4 MG tablet Take 4 mg by mouth every 8 (eight) hours as needed for nausea or vomiting. Reported on 11/11/2015   Yes [provider]    Family History Family History  Problem Relation Age of Onset  . Colon cancer Neg Hx     Social History Social History   Tobacco Use  . Smoking status: Former Smoker    Packs/day: 1.00    Quit date: 12/14/1975    Years since quitting: 42.9  .  Smokeless tobacco: Never Used  Substance Use Topics  . Alcohol use: Yes    Alcohol/week: 0.0 standard drinks    Comment: USED TO DRINK BEER REGULARLY NOW DRINKS ABOUT 2 BEERS PER MONTH  . Drug use: No     Allergies   Patient has no known allergies.   Review of Systems Review of Systems  Unable to perform ROS: Dementia     Physical Exam Updated Vital Signs BP (!) 180/77 (BP Location: Left Arm)   Pulse (!) 53   Temp 98.2 F (36.8 C) (Oral)   Resp 14   SpO2 100%   Physical Exam Vitals signs and nursing note reviewed.  Constitutional:      General: He is not in acute distress.    Appearance: Normal appearance. He is well-developed and normal weight. He is not ill-appearing or diaphoretic.     Comments: Chronically ill-appearing but in no acute distress.  HENT:     Head: Normocephalic and atraumatic.     Mouth/Throat:     Mouth: Mucous membranes are moist.     Pharynx: Oropharynx is clear.  Eyes:     General:        Right eye: No discharge.        Left eye: No discharge.     Pupils: Pupils are equal, round, and reactive to light.  Neck:     Musculoskeletal: Neck supple.  Cardiovascular:     Rate and Rhythm: Normal rate and regular rhythm.     Heart sounds: Normal heart sounds.  Pulmonary:     Effort: Pulmonary effort is normal. No respiratory distress.     Breath sounds: Rhonchi present. No wheezing or rales.     Comments: Patient breathing well on room air, normal effort.  Lung fields with a few scattered rhonchi, no other adventitious lung fields. Abdominal:     General: Bowel sounds are normal. There is no distension.     Palpations: Abdomen is soft. There is no mass.     Tenderness: There is no abdominal tenderness. There is no guarding.  Musculoskeletal:        General: No deformity.  Skin:    General: Skin is warm and dry.     Capillary Refill: Capillary refill takes less than 2 seconds.  Neurological:     Mental Status: He is alert.     Coordination:  Coordination normal.     Comments: Able to follow commands Moves extremities without ataxia, coordination intact   Psychiatric:        Mood and Affect: Mood normal.        Behavior: Behavior normal.      ED Treatments / Results  Labs (all labs ordered are listed, but only abnormal results are displayed) Labs Reviewed  CBC WITH DIFFERENTIAL/PLATELET - Abnormal; Notable for the  following components:      Result Value   RBC 3.85 (*)    Hemoglobin 11.8 (*)    HCT 38.6 (*)    MCV 100.3 (*)    All other components within normal limits  SARS CORONAVIRUS 2 (HOSPITAL ORDER, [place] [facility])  CULTURE, BLOOD (ROUTINE X 2)  CULTURE, BLOOD (ROUTINE X 2)  COMPREHENSIVE METABOLIC PANEL  LIPASE, BLOOD  LACTIC ACID, PLASMA  URINALYSIS, ROUTINE W REFLEX MICROSCOPIC  BASIC METABOLIC PANEL  CBC    EKG EKG Interpretation  Date/Time:  Thursday November 07 2018 20:40:21 EDT Ventricular Rate:  74 PR Interval:  164 QRS Duration: 76 QT Interval:  416 QTC Calculation: 461 R Axis:   6 Text Interpretation:  Normal sinus rhythm Normal ECG Confirmed by Fredia Sorrow [phone]) on 11/07/2018 8:52:12 PM   Radiology Ct Abdomen Pelvis W Contrast  Result Date: 11/07/2018 CLINICAL DATA:  Nausea and vomiting with low-grade fever. Alcohol dependence. EXAM: CT ABDOMEN AND PELVIS WITH CONTRAST TECHNIQUE: Multidetector CT imaging of the abdomen and pelvis was performed using the standard protocol following bolus administration of intravenous contrast. CONTRAST:  137mL OMNIPAQUE IOHEXOL 300 MG/ML  SOLN COMPARISON:  CT abdomen pelvis 09/09/2014 FINDINGS: LOWER CHEST: There is no basilar pleural or apical pericardial effusion. HEPATOBILIARY: The hepatic contours and density are normal. There is no intra- or extrahepatic biliary dilatation. There is cholelithiasis without acute inflammation. PANCREAS: The pancreatic parenchymal contours are normal and there is no ductal dilatation. There is no  peripancreatic fluid collection. SPLEEN: Normal. ADRENALS/URINARY TRACT: --Adrenal glands: Normal. --Right kidney/ureter: No hydronephrosis, nephroureterolithiasis, perinephric stranding or solid renal mass. --Left kidney/ureter: No hydronephrosis, nephroureterolithiasis, perinephric stranding or solid renal mass. --Urinary bladder: Normal for degree of distention STOMACH/BOWEL: --Stomach/Duodenum: There is no hiatal hernia or other gastric abnormality. The duodenal course and caliber are normal. --Small bowel: No dilatation or inflammation. --Colon: Anastomosis of the sigmoid colon. --Appendix: Normal. VASCULAR/LYMPHATIC: Normal course and caliber of the major abdominal vessels. No abdominal or pelvic lymphadenopathy. REPRODUCTIVE: Prostate obscured by streak artifact from right total hip arthroplasty. MUSCULOSKELETAL. There are unchanged compression deformities of T9, T12, L1 and L2. OTHER: None. IMPRESSION: 1. No acute abdominal or pelvic abnormality. 2. Anastomosis at the sigmoid colon. No abdominal mass or evidence of metastatic disease. 3. Cholelithiasis without acute cholecystitis. Electronically Signed   By: Ulyses Jarred M.D.   On: 11/07/2018 23:26   Dg Chest Port 1 View  Result Date: 11/07/2018 CLINICAL DATA:  Nausea and vomiting with body aches. EXAM: PORTABLE CHEST 1 VIEW COMPARISON:  Chest x-ray dated March 07, 2015 FINDINGS: There is an airspace opacity at the right lung base. The lung volumes are low. There is some blunting of the left costophrenic angle. Old healed right-sided rib fractures are noted. There is no pneumothorax. Old healed left-sided rib fractures are noted. IMPRESSION: Low lung volumes with bibasilar airspace opacities, right worse than left, which may represent atelectasis, infiltrate, or aspiration in the appropriate clinical setting. Electronically Signed   By: Constance Holster M.D.   On: 11/07/2018 19:45    Procedures Procedures (including critical care time)   Medications Ordered in ED Medications  sodium chloride 0.9 % bolus 500 mL (0 mLs Intravenous Stopped 11/07/18 2131)  ondansetron (ZOFRAN) injection 4 mg (4 mg Intravenous Given 11/07/18 2023)  iohexol (OMNIPAQUE) 300 MG/ML solution 100 mL (100 mLs Intravenous Contrast Given 11/07/18 2238)  Ampicillin-Sulbactam (UNASYN) 3 g in sodium chloride 0.9 % 100 mL IVPB (3 g Intravenous New  Bag/Given 11/07/18 2304)     Initial Impression / Assessment and Plan / ED Course  I have reviewed the triage vital signs and the nursing notes.  Pertinent labs & imaging results that were available during my care of the patient were reviewed by me and considered in my medical decision making (see chart for details).  Patient presents with persistent vomiting, low-grade fevers at facility, he is demented and unable to contribute much to history.  On arrival he is overall well-appearing, he does have some scattered rhonchi in the lower lung fields.  Abdomen is benign but patient continues to vomit clear fluids.  Will get basic labs, lipase, urinalysis, lactic acid, blood cultures, chest x-ray and CT abdomen pelvis.  Will also check coronavirus test.  IV fluids and Zofran given.  Labs show no leukocytosis, hemoglobin of 11.8, no acute electrolyte derangements, normal renal liver function and normal lipase.  Lactic acid is not elevated and COVID test is negative.  Chest x-ray does show signs of bilateral lower lobe airspace opacities right worse than left and given patient's recurrent vomiting I am concerned this could be related to aspiration.  CT abdomen pelvis does not show acute pathology within the abdomen, previous anastomosis from colon resection is intact with no masses noted  We will treat with Unasyn for aspiration pneumonia and admit to hospitalist.  Case discussed with Dr. Posey Pronto with [facility] hospitalist who will see and admit the patient  Final Clinical Impressions(s) / ED Diagnoses   Final diagnoses:   Aspiration pneumonia due to vomit, unspecified laterality, unspecified part of lung [facility])    ED Discharge Orders    None       Janet Berlin 11/08/18 4103    Fredia Sorrow, MD 11/10/18 1705

## 2018-11-08 ENCOUNTER — Encounter (HOSPITAL_COMMUNITY): Payer: Self-pay

## 2018-11-08 DIAGNOSIS — J690 Pneumonitis due to inhalation of food and vomit: Secondary | ICD-10-CM | POA: Diagnosis present

## 2018-11-08 DIAGNOSIS — I10 Essential (primary) hypertension: Secondary | ICD-10-CM | POA: Diagnosis present

## 2018-11-08 DIAGNOSIS — F0280 Dementia in other diseases classified elsewhere without behavioral disturbance: Secondary | ICD-10-CM | POA: Diagnosis present

## 2018-11-08 DIAGNOSIS — Z87898 Personal history of other specified conditions: Secondary | ICD-10-CM

## 2018-11-08 DIAGNOSIS — Z79899 Other long term (current) drug therapy: Secondary | ICD-10-CM | POA: Diagnosis not present

## 2018-11-08 DIAGNOSIS — Z85038 Personal history of other malignant neoplasm of large intestine: Secondary | ICD-10-CM | POA: Diagnosis not present

## 2018-11-08 DIAGNOSIS — K219 Gastro-esophageal reflux disease without esophagitis: Secondary | ICD-10-CM | POA: Diagnosis present

## 2018-11-08 DIAGNOSIS — D539 Nutritional anemia, unspecified: Secondary | ICD-10-CM | POA: Diagnosis present

## 2018-11-08 DIAGNOSIS — R112 Nausea with vomiting, unspecified: Secondary | ICD-10-CM

## 2018-11-08 DIAGNOSIS — F1021 Alcohol dependence, in remission: Secondary | ICD-10-CM | POA: Diagnosis present

## 2018-11-08 DIAGNOSIS — F0390 Unspecified dementia without behavioral disturbance: Secondary | ICD-10-CM

## 2018-11-08 DIAGNOSIS — K8020 Calculus of gallbladder without cholecystitis without obstruction: Secondary | ICD-10-CM | POA: Diagnosis present

## 2018-11-08 DIAGNOSIS — Z9049 Acquired absence of other specified parts of digestive tract: Secondary | ICD-10-CM | POA: Diagnosis not present

## 2018-11-08 DIAGNOSIS — G40509 Epileptic seizures related to external causes, not intractable, without status epilepticus: Secondary | ICD-10-CM | POA: Diagnosis present

## 2018-11-08 DIAGNOSIS — F0150 Vascular dementia without behavioral disturbance: Secondary | ICD-10-CM | POA: Diagnosis present

## 2018-11-08 DIAGNOSIS — Z20828 Contact with and (suspected) exposure to other viral communicable diseases: Secondary | ICD-10-CM | POA: Diagnosis present

## 2018-11-08 DIAGNOSIS — Z87891 Personal history of nicotine dependence: Secondary | ICD-10-CM | POA: Diagnosis not present

## 2018-11-08 DIAGNOSIS — C184 Malignant neoplasm of transverse colon: Secondary | ICD-10-CM | POA: Diagnosis not present

## 2018-11-08 LAB — CBC
HCT: 35.5 % — ABNORMAL LOW (ref 39.0–52.0)
Hemoglobin: 11.4 g/dL — ABNORMAL LOW (ref 13.0–17.0)
MCH: 31.1 pg (ref 26.0–34.0)
MCHC: 32.1 g/dL (ref 30.0–36.0)
MCV: 96.7 fL (ref 80.0–100.0)
Platelets: 160 10*3/uL (ref 150–400)
RBC: 3.67 MIL/uL — ABNORMAL LOW (ref 4.22–5.81)
RDW: 11.3 % — ABNORMAL LOW (ref 11.5–15.5)
WBC: 7.9 10*3/uL (ref 4.0–10.5)
nRBC: 0 % (ref 0.0–0.2)

## 2018-11-08 LAB — BASIC METABOLIC PANEL
Anion gap: 8 (ref 5–15)
BUN: 20 mg/dL (ref 8–23)
CO2: 24 mmol/L (ref 22–32)
Calcium: 8.7 mg/dL — ABNORMAL LOW (ref 8.9–10.3)
Chloride: 109 mmol/L (ref 98–111)
Creatinine, Ser: 1.03 mg/dL (ref 0.61–1.24)
GFR calc Af Amer: >60 mL/min (ref >60)
GFR calc non Af Amer: >60 mL/min (ref >60)
Glucose, Bld: 82 mg/dL (ref 70–99)
Potassium: 4.3 mmol/L (ref 3.5–5.1)
Sodium: 141 mmol/L (ref 135–145)

## 2018-11-08 LAB — PROCALCITONIN: Procalcitonin: <0.1 ng/mL

## 2018-11-08 MED ORDER — SALINE SPRAY 0.65 % NA SOLN
1.0000 | NASAL | Status: DC | PRN
  Filled 2018-11-08: qty 44

## 2018-11-08 MED ORDER — HYDRALAZINE HCL 20 MG/ML IJ SOLN: 10.0000 mg | INTRAMUSCULAR | Status: DC | PRN

## 2018-11-08 MED ORDER — POLYVINYL ALCOHOL 1.4 % OP SOLN
1.0000 [drp] | OPHTHALMIC | Status: DC | PRN
  Filled 2018-11-08: qty 15

## 2018-11-08 MED ORDER — LIP MEDEX EX OINT: 1.0000 "application " | TOPICAL_OINTMENT | CUTANEOUS | Status: DC | PRN

## 2018-11-08 MED ORDER — ONDANSETRON HCL 4 MG/2ML IJ SOLN: 4.0000 mg | Freq: Four times a day (QID) | INTRAMUSCULAR | Status: DC | PRN

## 2018-11-08 MED ORDER — LORATADINE 10 MG PO TABS: 10.0000 mg | ORAL_TABLET | Freq: Every day | ORAL | Status: DC | PRN

## 2018-11-08 MED ORDER — ACETAMINOPHEN 650 MG RE SUPP: 650.0000 mg | Freq: Four times a day (QID) | RECTAL | Status: DC | PRN

## 2018-11-08 MED ORDER — ALUM & MAG HYDROXIDE-SIMETH 200-200-20 MG/5ML PO SUSP: 30.0000 mL | ORAL | Status: DC | PRN

## 2018-11-08 MED ORDER — HYDRALAZINE HCL 20 MG/ML IJ SOLN
5.0000 mg | Freq: Four times a day (QID) | INTRAMUSCULAR | Status: DC | PRN
  Administered 2018-11-08: 5 mg via INTRAVENOUS
  Filled 2018-11-08: qty 1

## 2018-11-08 MED ORDER — MUSCLE RUB 10-15 % EX CREA
1.0000 "application " | TOPICAL_CREAM | CUTANEOUS | Status: DC | PRN
  Filled 2018-11-08: qty 85

## 2018-11-08 MED ORDER — GUAIFENESIN-DM 100-10 MG/5ML PO SYRP: 5.0000 mL | ORAL_SOLUTION | ORAL | Status: DC | PRN

## 2018-11-08 MED ORDER — DIVALPROEX SODIUM 125 MG PO CSDR
125.0000 mg | DELAYED_RELEASE_CAPSULE | Freq: Two times a day (BID) | ORAL | Status: DC
  Administered 2018-11-08 – 2018-11-11 (×8): 125 mg via ORAL
  Filled 2018-11-08 (×8): qty 1

## 2018-11-08 MED ORDER — HYDROCORTISONE 1 % EX CREA
1.0000 "application " | TOPICAL_CREAM | Freq: Three times a day (TID) | CUTANEOUS | Status: DC | PRN
  Filled 2018-11-08: qty 28

## 2018-11-08 MED ORDER — SODIUM CHLORIDE 0.9 % IV SOLN
INTRAVENOUS | Status: AC
  Administered 2018-11-08: 02:00:00 via INTRAVENOUS

## 2018-11-08 MED ORDER — SENNOSIDES-DOCUSATE SODIUM 8.6-50 MG PO TABS: 2.0000 | ORAL_TABLET | Freq: Every evening | ORAL | Status: DC | PRN

## 2018-11-08 MED ORDER — ENOXAPARIN SODIUM 40 MG/0.4ML ~~LOC~~ SOLN
40.0000 mg | Freq: Every day | SUBCUTANEOUS | Status: DC
  Administered 2018-11-08 – 2018-11-11 (×4): 40 mg via SUBCUTANEOUS
  Filled 2018-11-08 (×4): qty 0.4

## 2018-11-08 MED ORDER — PHENOL 1.4 % MT LIQD
1.0000 | OROMUCOSAL | Status: DC | PRN
  Filled 2018-11-08: qty 177

## 2018-11-08 MED ORDER — POLYETHYLENE GLYCOL 3350 17 G PO PACK: 17.0000 g | PACK | Freq: Every day | ORAL | Status: DC | PRN

## 2018-11-08 MED ORDER — ONDANSETRON HCL 4 MG PO TABS: 4.0000 mg | ORAL_TABLET | Freq: Four times a day (QID) | ORAL | Status: DC | PRN

## 2018-11-08 MED ORDER — HYDROCORTISONE (PERIANAL) 2.5 % EX CREA
1.0000 "application " | TOPICAL_CREAM | Freq: Four times a day (QID) | CUTANEOUS | Status: DC | PRN
  Filled 2018-11-08: qty 28.35

## 2018-11-08 MED ORDER — ACETAMINOPHEN 325 MG PO TABS: 650.0000 mg | ORAL_TABLET | Freq: Four times a day (QID) | ORAL | Status: DC | PRN

## 2018-11-08 MED ORDER — LATANOPROST 0.005 % OP SOLN
1.0000 [drp] | Freq: Every day | OPHTHALMIC | Status: DC
  Administered 2018-11-08 – 2018-11-10 (×4): 1 [drp] via OPHTHALMIC
  Filled 2018-11-08: qty 2.5

## 2018-11-08 MED ORDER — SODIUM CHLORIDE 0.9 % IV SOLN
INTRAVENOUS | Status: AC
  Administered 2018-11-08: 13:00:00 50 mL/h via INTRAVENOUS

## 2018-11-08 NOTE — Evaluation (Signed)
SLP Cancellation Note  Patient Details Name: Lory Galan MRN: [id number] DOB: [birth year]   Cancelled treatment:       Reason Eval/Treat Not Completed: Other (comment);Fatigue/lethargy limiting ability to participate(will continue efforts)  Luanna Salk, [facility] SLP Acute Rehab Services Pager [phone] Office [phone]  Macario Golds 11/08/2018, 2:19 PM

## 2018-11-08 NOTE — TOC Initial Note (Signed)
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    CSW reached out to patient POA-daughter. Patient daughter reports the patient is a LTC resident at [facility]. She reports the patient will return at discharge. Daughter reports the patient needs assistance with bathing. He is independent with feeding and dressing self. She reports he ambulates without assistance although the facility highly recommends he use a cane for balance. Daughter aware and understands the patient will have a speech evaluation today.  FL2 started/ to updated as needed.   Expected Discharge Plan: Memory Care Barriers to Discharge: No Barriers Identified   Patient Goals and CMS Choice        Expected Discharge Plan and Services Expected Discharge Plan: Memory Care In-house Referral: Clinical Social Work Discharge Planning Services: CM Consult   Living arrangements for the past 2 months: Assisted Living Facility(Memory Care) Expected Discharge Date: 11/10/18                                    Prior Living Arrangements/Services Living arrangements for the past 2 months: Assisted Living Facility(Memory Care) Lives with:: Facility Resident Patient language and need for interpreter reviewed:: No Do you feel safe going back to the place where you live?: Yes      Need for Family Participation in Patient Care: Yes (Comment) Care giver support system in place?: Yes (comment) Current home services:  DME Criminal Activity/Legal Involvement Pertinent to Current Situation/Hospitalization: No - Comment as needed  Activities of Daily Living   ADL Screening (condition at time of admission) Patient's cognitive ability adequate to safely complete daily activities?: No Is the patient deaf or have difficulty hearing?: No Does the patient have difficulty seeing, even when wearing glasses/contacts?: No Does the patient have difficulty concentrating, remembering, or making decisions?: Yes Patient able to express need for assistance with ADLs?: Yes Does the patient have difficulty dressing or bathing?: Yes Independently performs ADLs?: No Communication: Appropriate for developmental age Dressing (OT): Needs assistance Is this a change from baseline?: Pre-admission baseline Grooming: Needs assistance Is this a change from baseline?: Pre-admission baseline Feeding: Needs assistance Is this a change from baseline?: Pre-admission baseline Bathing: Needs assistance Is this a change from baseline?: Pre-admission baseline Toileting: Needs assistance Is this a change from baseline?: Pre-admission baseline In/Out Bed: Needs assistance Is this a change from baseline?: Pre-admission baseline Walks in Home: Needs assistance Is this a change from baseline?: Pre-admission baseline Does the patient have difficulty walking or climbing stairs?: Yes Weakness of Legs: Both Weakness of Arms/Hands: Both  Permission Sought/Granted Permission sought to share information with : Family Supports          Permission granted to share info w Relationship: Daugther  Permission granted to share info w Contact Information: [name]  Emotional Assessment Appearance:: Appears stated age   Affect (typically observed): Unable to Assess   Alcohol / Substance Use: Not Applicable Psych Involvement: No (comment)  Admission diagnosis:  Aspiration pneumonia due  to vomit, unspecified laterality, unspecified part of lung  ([place]) [J69.0] Patient Active Problem List   Diagnosis Date Noted  . Aspiration pneumonitis ([place]) 11/08/2018  . Dementia ([place])   . History of alcohol use disorder   . History of seizures   . Nausea and vomiting   . Rib fracture 03/16/2015  . Anemia 03/16/2015  . Benign essential HTN 12/24/2014  . 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[place]) 12/16/2014   PCP:  Sande Brothers, MD Pharmacy:   [facility] [address], [place] [address] Phone: [phone] Fax: [place], [place] [address] Phone: [phone] Fax: [phone]     Social Determinants of Health (SDOH) Interventions    Readmission Risk Interventions No flowsheet data found.

## 2018-11-08 NOTE — Evaluation (Signed)
Clinical/Bedside Swallow Evaluation Patient Details  Name: [name] MRN: [id number] Date of Birth: Jul 16, 1934  Today's Date: 11/08/2018 Time: SLP Start Time (ACUTE ONLY): 1505 SLP Stop Time (ACUTE ONLY): 1535 SLP Time Calculation (min) (ACUTE ONLY): 30 min  Past Medical History:  Past Medical History:  Diagnosis Date  . Alcohol dependence ([place])    Associated with thrombocytopenia, anemia, elevated LFTs, hypomagnesemia  . Anemia   . Arthritis   . Colon cancer ([place])   . Dementia ([place])    Vascular and alcohol related.  [name] Kitchen HTN (hypertension)   . Seizures ([place])    Related to withdrawal, no seizure in years   Past Surgical History:  Past Surgical History:  Procedure Laterality Date  . COLONOSCOPY    . HEMICOLECTOMY  12/2014  . ORIF FEMORAL NECK FRACTURE W/ DHS     HPI:      Assessment / Plan / Recommendation Clinical Impression  Upon raising HOB, pt presented with belching, and said he is "sick".  Provided him with basin and tissues if he recurrently vomited.  Limited assessment given pt only taking a few boluses of thin liquids and jello - stating he "feels bad".   NO focal cranial nerve deficits noted that would impact oropharyngeal swallowing.  Pt conducts multiple audible swallows (x5-6) with every bolus of po *thin liquid, [place] lemon lime and jello*.  He did not demonstrate any voice quality changes nor s/s of aspiration with po.  He declined to consume any further intake.    Pt is a poor historian and during session just repeated "It's not about that" "I can't go where I want to go" but unable to answer questions re: his swallowing.  SLP does not suspect primary oropharyngeal dysphagia but can not rule out definitively.  Recommend to consider GI referral to help delineate source of pt n/v.    SLP will follow up to help determine if our services are needed for further testing,etc.  SLP attempted to call daughter [name] on the number in the chart but it went to an unidentifiable  voice mail so could not leave message.   SLP Visit Diagnosis: Dysphagia, unspecified (R13.10)    Aspiration Risk    Moderate   Diet Recommendation (small amounts of clears only recommended pending GI)   Liquid Administration via: Cup;Straw Medication Administration: Whole meds with puree Supervision: Patient able to self feed Compensations: Slow rate;Small sips/bites Postural Changes: Remain upright for at least 30 minutes after po intake;Seated upright at 90 degrees    Other  Recommendations Recommended Consults: Consider GI evaluation Oral Care Recommendations: Oral care QID   Follow up Recommendations        Frequency and Duration min 1 x/week  1 week       Prognosis Prognosis for Safe Diet Advancement: Guarded(unknown source)      Swallow Study   General      Oral/Motor/Sensory Function Overall Oral Motor/Sensory Function: Within functional limits   Ice Chips Ice chips: Not tested   Thin Liquid Thin Liquid: Impaired Presentation: Cup;Self Fed;Straw Pharyngeal  Phase Impairments: Multiple swallows Other Comments: audible swallow, pt swallows 5-6 times with each bolus    Nectar Thick Nectar Thick Liquid: Not tested   Honey Thick Honey Thick Liquid: Not tested   Puree Other Comments: jello bolus- pt swallows 5-6 times with each small bolus   Solid     Solid: Not tested      Macario Golds 11/08/2018,4:32 PM   Luanna Salk, MS [facility]  [place] Pager [phone] Office [phone]

## 2018-11-08 NOTE — Progress Notes (Signed)
Patient admitted overnight with diagnosis of nausea vomiting secondary to possible gastroenteritis.  No acute causes noted.  There was also concerns of aspiration pneumonia/pneumonitis.  Started on Unasyn. Patient seen and examined this morning.  He is awake but disoriented carry on a conversation.  Does not appear to be any acute distress.  He does not really know if he aspirates when he is eating and drinking.  At this time I will check his procalcitonin levels, if negative discontinue antibiotics.  Check BNP level and order speech and swallow.  In the meantime continue gentle hydration and monitoring him.  We need to ensure that he is having enough oral intake as well.  Call with any questions as needed.  Gerlean Ren MD [facility]

## 2018-11-08 NOTE — H&P (Signed)
History and Physical    [name] AST:[id number] DOB: 1934/11/08 DOA: 11/07/2018  PCP: Sande Brothers, MD  [name]  I have personally briefly reviewed patient's old medical records in [place]  Chief Complaint: Nausea and vomiting  HPI: [name] is a 84 y.o. 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  Patient has advanced dementia and is unable to provide history, therefore entirety of history is obtained from EDP and chart review.  Per ED documentation and provider patient had new onset nausea and emesis several times at his ALF.  He was unable to keep down oral medications.  He had a temperature of 99.8 Fahrenheit.  There was concern for aspiration pneumonia and due to persistent nausea and vomiting he was sent to the ED for further evaluation.  ED Course:  Initial vitals in the ED showed BP 183/87, pulse 71, RR 16, temp 99.0 Fahrenheit, SPO2 99% on room air.  Labs are notable for WBC 7.3, hemoglobin 11.8, platelets 161,000, lipase 26, sodium 141, potassium 4.3, bicarb 26, BUN 20, creatinine 0.97, LFTs within normal limits.  Blood cultures were obtained and pending.  SARS-CoV-2 test was negative.  Portable chest x-ray showed low lung volumes with questionable bibasilar airspace opacities, right greater than left.  CT abdomen/pelvis was negative for acute abdominal or pelvic abnormality.  Anastomosis at the sigmoid colon was seen without evidence of abdominal mass or metastatic disease.  Cholelithiasis without evidence of cholecystitis was noted.  The patient was given 500 mLs normal saline, IV Zofran, and IV Unasyn and the hospitalist service was consulted to admit for further evaluation  and management of persistent nausea/vomiting and suspected aspiration pneumonia.   Review of Systems:  Unable to obtain full review of systems due to advanced dementia.   Past Medical History:  Diagnosis Date   Alcohol dependence ([place])    Associated with thrombocytopenia, anemia, elevated LFTs, hypomagnesemia   Anemia    Arthritis    Colon cancer (H[facility])    Dementia    Vascular and alcohol related.   HTN (hypertension)    Seizures ([place])    Related to withdrawal, no seizure in years    Past Surgical History:  Procedure Laterality Date   COLONOSCOPY     HEMICOLECTOMY  12/2014   ORIF FEMORAL NECK FRACTURE W/ DHS      Social History:  reports that he quit smoking about 42 years ago. He smoked 1.00 pack per day. He has never used smokeless tobacco. He reports current alcohol use. He reports that he does not use drugs.  No Known Allergies  Family History  Problem Relation Age of Onset   Colon cancer Neg Hx      Prior to Admission medications   Medication Sig Start Date End Date Taking? Authorizing Provider  acetaminophen (TYLENOL) 500 MG tablet Take 500 mg by mouth 2 (two) times a day. Not to exceed 2000 mg in 24 hours.  Call MD if fever is above 101F    Yes [provider]  alum & mag hydroxide-simeth (MAALOX/MYLANTA) 200-200-20 MG/5ML suspension Take 30 mLs by mouth every 6 (six) hours as needed for indigestion or heartburn. Reported on 11/11/2015   Yes [provider]  divalproex (DEPAKOTE SPRINKLE) 125 MG capsule Take 125 mg  by mouth 2 (two) times daily.  09/23/18  Yes [provider]  guaifenesin (ROBITUSSIN) 100 MG/5ML syrup Take 200 mg by mouth 4 (four) times daily as needed for cough. Reported on 11/11/2015   Yes [provider]  latanoprost (XALATAN) 0.005 % ophthalmic solution Place 1 drop into both eyes at bedtime. (if patient will tolerate) 10/08/18  Yes [provider]  loperamide (LOPERAMIDE A-D) 2 MG tablet Take 2  mg by mouth 4 (four) times daily as needed for diarrhea or loose stools. Reported on 11/11/2015   Yes [provider]  magnesium hydroxide (MILK OF MAGNESIA) 400 MG/5ML suspension Take 30 mLs by mouth at bedtime as needed for mild constipation. Reported on 11/11/2015   Yes [provider]  neomycin-bacitracin-polymyxin (NEOSPORIN) ointment Apply 1 application topically See admin instructions. Reported on 11/11/2015   Yes [provider]  ondansetron (ZOFRAN) 4 MG tablet Take 4 mg by mouth every 8 (eight) hours as needed for nausea or vomiting. Reported on 11/11/2015   Yes [provider]    Physical Exam: Vitals:   Nov 10, 2018 2024 11/10/18 2100 11-10-2018 2215 2018/11/10 2304  BP:  (!) 156/81 (!) 148/93 (!) 156/74  Pulse:  80  77  Resp:  13  13  Temp: 98.3 F (36.8 C)     TempSrc: Rectal     SpO2:  95%  100%    Constitutional: Elderly man resting in the right lateral decubitus position in bed, NAD, calm, comfortable Eyes: PERRL, lids and conjunctivae normal ENMT: Mucous membranes are dry. Posterior pharynx clear of any exudate or lesions. Neck: normal, supple, no masses. Respiratory: clear to auscultation bilaterally, no wheezing, no crackles. Normal respiratory effort. No accessory muscle use.  Cardiovascular: Regular rate and rhythm, no murmurs / rubs / gallops.  +1 bilateral lower extremity edema. Abdomen: no tenderness, no masses palpated. No hepatosplenomegaly. Bowel sounds positive.  Musculoskeletal: no clubbing / cyanosis. No joint deformity upper and lower extremities. Good ROM, no contractures. Skin: Venous stasis changes of both lower extremities, no rashes, lesions, ulcers. No induration Neurologic: Limited due to patient cooperation, Sensation and strength 5/5 appear intact. Psychiatric: Alert and oriented x2 which is his reported baseline.    Labs on Admission: I have personally reviewed following labs and imaging studies  CBC: Recent Labs    Lab 11/10/18 1917  WBC 7.3  NEUTROABS 4.1  HGB 11.8*  HCT 38.6*  MCV 100.3*  PLT 253   Basic Metabolic Panel: Recent Labs  Lab 10-Nov-2018 1917  NA 141  K 4.3  CL 108  CO2 26  GLUCOSE 91  BUN 20  CREATININE 0.97  CALCIUM 8.9   GFR: CrCl cannot be calculated (Unknown ideal weight.). Liver Function Tests: Recent Labs  Lab 11-10-2018 1917  AST 26  ALT 22  ALKPHOS 52  BILITOT 0.5  PROT 7.2  ALBUMIN 3.7   Recent Labs  Lab 11/10/2018 1917  LIPASE 26   No results for input(s): AMMONIA in the last 168 hours. Coagulation Profile: No results for input(s): INR, PROTIME in the last 168 hours. Cardiac Enzymes: No results for input(s): CKTOTAL, CKMB, CKMBINDEX, TROPONINI in the last 168 hours. BNP (last 3 results) No results for input(s): PROBNP in the last 8760 hours. HbA1C: No results for input(s): HGBA1C in the last 72 hours. CBG: No results for input(s): GLUCAP in the last 168 hours. Lipid Profile: No results for input(s): CHOL, HDL, LDLCALC, TRIG, CHOLHDL, LDLDIRECT in the last 72 hours. Thyroid Function Tests: No  results for input(s): TSH, T4TOTAL, FREET4, T3FREE, THYROIDAB in the last 72 hours. Anemia Panel: No results for input(s): VITAMINB12, FOLATE, FERRITIN, TIBC, IRON, RETICCTPCT in the last 72 hours. Urine analysis:    Component Value Date/Time   COLORURINE YELLOW 03/20/2011 0407   APPEARANCEUR CLOUDY (A) 03/20/2011 0407   LABSPEC 1.019 03/20/2011 0407   PHURINE 5.5 03/20/2011 0407   GLUCOSEU NEGATIVE 03/20/2011 0407   HGBUR SMALL (A) 03/20/2011 0407   BILIRUBINUR NEGATIVE 03/20/2011 0407   KETONESUR 15 (A) 03/20/2011 0407   PROTEINUR 30 (A) 03/20/2011 0407   UROBILINOGEN 1.0 03/20/2011 0407   NITRITE NEGATIVE 03/20/2011 0407   LEUKOCYTESUR SMALL (A) 03/20/2011 0407    Radiological Exams on Admission: Ct Abdomen Pelvis W Contrast  Result Date: 11/07/2018 CLINICAL DATA:  Nausea and vomiting with low-grade fever. Alcohol dependence. EXAM: CT  ABDOMEN AND PELVIS WITH CONTRAST TECHNIQUE: Multidetector CT imaging of the abdomen and pelvis was performed using the standard protocol following bolus administration of intravenous contrast. CONTRAST:  130mL OMNIPAQUE IOHEXOL 300 MG/ML  SOLN COMPARISON:  CT abdomen pelvis 09/09/2014 FINDINGS: LOWER CHEST: There is no basilar pleural or apical pericardial effusion. HEPATOBILIARY: The hepatic contours and density are normal. There is no intra- or extrahepatic biliary dilatation. There is cholelithiasis without acute inflammation. PANCREAS: The pancreatic parenchymal contours are normal and there is no ductal dilatation. There is no peripancreatic fluid collection. SPLEEN: Normal. ADRENALS/URINARY TRACT: --Adrenal glands: Normal. --Right kidney/ureter: No hydronephrosis, nephroureterolithiasis, perinephric stranding or solid renal mass. --Left kidney/ureter: No hydronephrosis, nephroureterolithiasis, perinephric stranding or solid renal mass. --Urinary bladder: Normal for degree of distention STOMACH/BOWEL: --Stomach/Duodenum: There is no hiatal hernia or other gastric abnormality. The duodenal course and caliber are normal. --Small bowel: No dilatation or inflammation. --Colon: Anastomosis of the sigmoid colon. --Appendix: Normal. VASCULAR/LYMPHATIC: Normal course and caliber of the major abdominal vessels. No abdominal or pelvic lymphadenopathy. REPRODUCTIVE: Prostate obscured by streak artifact from right total hip arthroplasty. MUSCULOSKELETAL. There are unchanged compression deformities of T9, T12, L1 and L2. OTHER: None. IMPRESSION: 1. No acute abdominal or pelvic abnormality. 2. Anastomosis at the sigmoid colon. No abdominal mass or evidence of metastatic disease. 3. Cholelithiasis without acute cholecystitis. Electronically Signed   By: Ulyses Jarred M.D.   On: 11/07/2018 23:26   Dg Chest Port 1 View  Result Date: 11/07/2018 CLINICAL DATA:  Nausea and vomiting with body aches. EXAM: PORTABLE CHEST 1 VIEW  COMPARISON:  Chest x-ray dated March 07, 2015 FINDINGS: There is an airspace opacity at the right lung base. The lung volumes are low. There is some blunting of the left costophrenic angle. Old healed right-sided rib fractures are noted. There is no pneumothorax. Old healed left-sided rib fractures are noted. IMPRESSION: Low lung volumes with bibasilar airspace opacities, right worse than left, which may represent atelectasis, infiltrate, or aspiration in the appropriate clinical setting. Electronically Signed   By: Constance Holster M.D.   On: 11/07/2018 19:45    EKG: Independently reviewed.  Normal sinus rhythm.  Assessment/Plan Principal Problem:   Aspiration pneumonia ([place]) Active Problems:   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[place])   Benign essential HTN   Dementia ([place])   History of alcohol use disorder   History of seizures  Javione Gunawan is a 83 y.o. 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  Nausea and vomiting: Unclear etiology, suspect viral gastroenteritis.  CT abdomen/pelvis with contrast without acute abnormality. -Keep n.p.o. advance diet as tolerated -IV antiemetics as needed -Gentle IV fluids overnight  Aspiration pneumonitis: Questionable basilar opacity seen on portable chest x-ray.  Patient was given IV Unasyn in the ED due to concern for aspiration pneumonia.  Patient is otherwise afebrile and without leukocytosis.  Will hold further antibiotics for now.  Consider swallow eval if demonstrating difficulty with oral intake.  Hypertension: Elevated on ED arrival with initial improvement.  He is not on antihypertensives as an outpatient. -IV hydralazine as needed  History of colon cancer status post left hemicolectomy: Anastomosis at the  sigmoid colon seen on CT scan without evidence of abdominal mass or metastatic disease.  History of alcohol dependence and withdrawal seizures: -Monitor CIWA -Continue home Depakote  Vascular and alcohol induced dementia: -Delirium precautions  DVT prophylaxis: Lovenox Code Status: Full code Family Communication: None present on admission Disposition Plan: Likely discharge back to previous ALF in 1-2 days Consults called: None Admission status: Observation   Zada Finders MD [facility]  If 7PM-7AM, please contact night-coverage [email or web address]  11/08/2018, 12:10 AM

## 2018-11-09 ENCOUNTER — Inpatient Hospital Stay (HOSPITAL_COMMUNITY): Payer: Medicare Other

## 2018-11-09 DIAGNOSIS — J690 Pneumonitis due to inhalation of food and vomit: Principal | ICD-10-CM

## 2018-11-09 DIAGNOSIS — F0390 Unspecified dementia without behavioral disturbance: Secondary | ICD-10-CM

## 2018-11-09 DIAGNOSIS — I10 Essential (primary) hypertension: Secondary | ICD-10-CM

## 2018-11-09 DIAGNOSIS — C184 Malignant neoplasm of transverse colon: Secondary | ICD-10-CM

## 2018-11-09 DIAGNOSIS — Z87898 Personal history of other specified conditions: Secondary | ICD-10-CM

## 2018-11-09 LAB — CBC
HCT: 32.7 % — ABNORMAL LOW (ref 39.0–52.0)
Hemoglobin: 9.9 g/dL — ABNORMAL LOW (ref 13.0–17.0)
MCH: 30.4 pg (ref 26.0–34.0)
MCHC: 30.3 g/dL (ref 30.0–36.0)
MCV: 100.3 fL — ABNORMAL HIGH (ref 80.0–100.0)
Platelets: 159 10*3/uL (ref 150–400)
RBC: 3.26 MIL/uL — ABNORMAL LOW (ref 4.22–5.81)
RDW: 11.4 % — ABNORMAL LOW (ref 11.5–15.5)
WBC: 5.8 10*3/uL (ref 4.0–10.5)
nRBC: 0 % (ref 0.0–0.2)

## 2018-11-09 LAB — COMPREHENSIVE METABOLIC PANEL
ALT: 18 U/L (ref 0–44)
AST: 24 U/L (ref 15–41)
Albumin: 2.9 g/dL — ABNORMAL LOW (ref 3.5–5.0)
Alkaline Phosphatase: 32 U/L — ABNORMAL LOW (ref 38–126)
Anion gap: 9 (ref 5–15)
BUN: 16 mg/dL (ref 8–23)
CO2: 22 mmol/L (ref 22–32)
Calcium: 8.1 mg/dL — ABNORMAL LOW (ref 8.9–10.3)
Chloride: 110 mmol/L (ref 98–111)
Creatinine, Ser: 1.06 mg/dL (ref 0.61–1.24)
GFR calc Af Amer: >60 mL/min (ref >60)
GFR calc non Af Amer: >60 mL/min (ref >60)
Glucose, Bld: 68 mg/dL — ABNORMAL LOW (ref 70–99)
Potassium: 3.9 mmol/L (ref 3.5–5.1)
Sodium: 141 mmol/L (ref 135–145)
Total Bilirubin: 0.6 mg/dL (ref 0.3–1.2)
Total Protein: 5.5 g/dL — ABNORMAL LOW (ref 6.5–8.1)

## 2018-11-09 LAB — BRAIN NATRIURETIC PEPTIDE: B Natriuretic Peptide: 246 pg/mL — ABNORMAL HIGH (ref 0.0–100.0)

## 2018-11-09 LAB — MAGNESIUM: Magnesium: 1.8 mg/dL (ref 1.7–2.4)

## 2018-11-09 MED ORDER — SENNOSIDES-DOCUSATE SODIUM 8.6-50 MG PO TABS: 2.0000 | ORAL_TABLET | Freq: Every evening | ORAL | Status: DC | PRN

## 2018-11-09 NOTE — Progress Notes (Signed)
  Speech Language Pathology Treatment: Dysphagia  Patient Details Name: [name] MRN: [id number] DOB: Nov 08, 1934 Today's Date: 11/09/2018 Time: 1607-3710 SLP Time Calculation (min) (ACUTE ONLY): 8 min  Assessment / Plan / Recommendation Clinical Impression  Patient seen for f/u diet tolerance and further needs after GI consult. Per GI, no endoscopic w/u needed at this time. N/V resolved. Patient alert however confused, distractible, and only agreeable to 3-4 sips of thin liquid. Oropharyngeal swallow functional with no overt indication of aspiration. Consistent multiple swallows noted suggestive of pharyngeal residue and/or esophageal component particularly with GI hx. Overall appears to be tolerating current diet. Unfortunately unable to assess with any solids today. Will continue to f/u.    HPI HPI: Mr [name] was admitted after n/v episodes.  He is from [facility] memory care.    He has h/o dementia, colon cancer, ETOH abuse with seizures.  Today's imaging studies show concern for aspiration vs aspiration pneumonitis present, CXR w/bilateral infiltrates, right more than left.  Swallow evaluation ordered. RN reports pt sleepy this am and had some difficulty getting his medication swallowed.      SLP Plan  Continue with current plan of care       Recommendations  Diet recommendations: Thin liquid(clear liquids per MD) Liquids provided via: Cup;Straw Medication Administration: Whole meds with puree Supervision: Patient able to self feed;Full supervision/cueing for compensatory strategies Compensations: Slow rate;Small sips/bites Postural Changes and/or Swallow Maneuvers: Seated upright 90 degrees;Upright 30-60 min after meal                Oral Care Recommendations: Oral care BID Follow up Recommendations: None SLP Visit Diagnosis: Dysphagia, unspecified (R13.10) Plan: Continue with current plan of care       GO              Derek Odonohue MA, CCC-SLP    [name] 11/09/2018, 3:23 PM

## 2018-11-09 NOTE — Plan of Care (Signed)
Confused.  Took meds well for me with jello.

## 2018-11-09 NOTE — Progress Notes (Signed)
PROGRESS NOTE    [name]  TKZ:[id number] DOB: [birth year] DOA: 11/07/2018 PCP: Sande Brothers, MD   Brief Narrative:  84 year old with history of essential hypertension, advanced dementia, alcohol disorder, seizure disorder, colon cancer status post hemicolectomy 12/2014, GERD came to the hospital for evaluation of intermittent nausea and vomiting with poor oral intake.  He was admitted for concerns of aspiration pneumonitis on antibiotics.  Was evaluated by speech and swallow,.  He has poor desire to eat and drink due to nausea.  GI team was consulted.   Assessment & Plan:   Principal Problem:   Nausea and vomiting Active Problems:   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HCC)   Benign essential HTN   Aspiration pneumonitis (HCC)   Dementia (HCC)   History of alcohol use disorder   History of seizures   Aspiration pneumonia (HCC)  Persistent nausea and vomiting, unclear etiology Gallstone without acute cholangitis - CT of the abdomen pelvis shows gallstones without acute cholangitis.  LFTs are normal.  We will give her a quadrant ultrasound. - GI team consulted for their input. -Supportive care, IV fluids, antiemetics.  Aspiration pneumonitis -Evaluate speech and swallow.  Has overall poor oral intake.  Basic aspiration precaution.  Not convinced that he has any infection, will start antibiotics.  History of colon cancer status post left hemicolectomy in 2016 -Has anastomosis of sigmoid colon.  Continue supportive care  History of advanced vascular dementia -Precautions supportive care  History of alcohol withdrawal seizures and dependence -On Depakote.  On withdrawal protocol.    DVT prophylaxis: Lovenox Code Status: Full code Family Communication: None at bedside Disposition Plan: To be determined.  Hopefully in next 24 hours.  Consultants:   GI  Procedures:   None  Antimicrobials:   Unasyn stop 6/26   Subjective: Patient is laying in the bed does not appear to be  in any distress.  No complaints.  Difficulty getting history from him.  No acute events overnight.  Review of Systems Otherwise negative except as per HPI, including: General = no fevers, chills, dizziness, malaise, fatigue HEENT/EYES = negative for pain, redness, loss of vision, double vision, blurred vision, loss of hearing, sore throat, hoarseness, dysphagia Cardiovascular= negative for chest pain, palpitation, murmurs, lower extremity swelling Respiratory/lungs= negative for shortness of breath, cough, hemoptysis, wheezing, mucus production Gastrointestinal= negative for nausea, vomiting,, abdominal pain, melena, hematemesis Genitourinary= negative for Dysuria, Hematuria, Change in Urinary Frequency MSK = Negative for arthralgia, myalgias, Back Pain, Joint swelling  Neurology= Negative for headache, seizures, numbness, tingling  Psychiatry= Negative for anxiety, depression, suicidal and homocidal ideation Allergy/Immunology= Medication/Food allergy as listed  Skin= Negative for Rash, lesions, ulcers, itching   Objective: Vitals:   11/08/18 1344 11/08/18 2203 11/09/18 0516 11/09/18 1049  BP: 108/79 (!) 123/51 (!) 134/58 (!) 147/68  Pulse: 65 63 63 66  Resp: 17 14 14    Temp: 97.6 F (36.4 C) 98.9 F (37.2 C) 98.2 F (36.8 C) 98.4 F (36.9 C)  TempSrc: Oral Oral Oral Oral  SpO2: 100% 98% 98% 99%    Intake/Output Summary (Last 24 hours) at 11/09/2018 1115 Last data filed at 11/08/2018 2259 Gross per 24 hour  Intake 60 ml  Output 825 ml  Net -765 ml   Filed Weights    Examination:  Constitutional: NAD, calm, comfortable, Cachectic frail-appearing. Eyes: PERRL, lids and conjunctivae normal ENMT: Mucous membranes are dry posterior pharynx clear of any exudate or lesions.poor dentition Neck: normal, supple, no masses, no thyromegaly Respiratory: Poor respiratory effort,  diminished breath sounds at the bases Cardiovascular: Regular rate and rhythm, no murmurs / rubs /  gallops. No extremity edema. 2+ pedal pulses. No carotid bruits.  Abdomen: no tenderness, no masses palpated. No hepatosplenomegaly. Bowel sounds positive.  Musculoskeletal: Contracted extremities. Skin: no rashes, lesions, ulcers. No induration Neurologic: CN 2-12 grossly intact. Sensation intact, DTR normal. Strength 4/5 in all 4.  Psychiatric: Alert awake but oriented only to his name.   Data Reviewed:   CBC: Recent Labs  Lab 11/07/18 1917 11/08/18 0319 11/09/18 0250  WBC 7.3 7.9 5.8  NEUTROABS 4.1  --   --   HGB 11.8* 11.4* 9.9*  HCT 38.6* 35.5* 32.7*  MCV 100.3* 96.7 100.3*  PLT 161 160 790   Basic Metabolic Panel: Recent Labs  Lab 11/07/18 1917 11/08/18 0319 11/09/18 0250  NA 141 141 141  K 4.3 4.3 3.9  CL 108 109 110  CO2 26 24 22   GLUCOSE 91 82 68*  BUN 20 20 16   CREATININE 0.97 1.03 1.06  CALCIUM 8.9 8.7* 8.1*  MG  --   --  1.8   GFR: CrCl cannot be calculated (Unknown ideal weight.). Liver Function Tests: Recent Labs  Lab 11/07/18 1917 11/09/18 0250  AST 26 24  ALT 22 18  ALKPHOS 52 32*  BILITOT 0.5 0.6  PROT 7.2 5.5*  ALBUMIN 3.7 2.9*   Recent Labs  Lab 11/07/18 1917  LIPASE 26   No results for input(s): AMMONIA in the last 168 hours. Coagulation Profile: No results for input(s): INR, PROTIME in the last 168 hours. Cardiac Enzymes: No results for input(s): CKTOTAL, CKMB, CKMBINDEX, TROPONINI in the last 168 hours. BNP (last 3 results) No results for input(s): PROBNP in the last 8760 hours. HbA1C: No results for input(s): HGBA1C in the last 72 hours. CBG: No results for input(s): GLUCAP in the last 168 hours. Lipid Profile: No results for input(s): CHOL, HDL, LDLCALC, TRIG, CHOLHDL, LDLDIRECT in the last 72 hours. Thyroid Function Tests: No results for input(s): TSH, T4TOTAL, FREET4, T3FREE, THYROIDAB in the last 72 hours. Anemia Panel: No results for input(s): VITAMINB12, FOLATE, FERRITIN, TIBC, IRON, RETICCTPCT in the last 72  hours. Sepsis Labs: Recent Labs  Lab 11/07/18 1918 11/08/18 0759  PROCALCITON  --  <0.10  LATICACIDVEN 1.8  --     Recent Results (from the past 240 hour(s))  Culture, blood (routine x 2)     Status: None (Preliminary result)   Collection Time: 11/07/18  7:18 PM   Specimen: BLOOD LEFT ARM  Result Value Ref Range Status   Specimen Description   Final    BLOOD LEFT ARM Performed at [facility] [address] [place], [place] [address]    Special Requests   Final    BOTTLES DRAWN AEROBIC ONLY Blood Culture results may not be optimal due to an inadequate volume of blood received in culture bottles Performed at [facility] [address], [place], [place] [address]    Culture   Final    NO GROWTH 2 DAYS Performed at [facility] [address] [place], [place] [address]    Report Status PENDING  Incomplete  Culture, blood (routine x 2)     Status: None (Preliminary result)   Collection Time: 11/07/18  7:23 PM   Specimen: BLOOD LEFT HAND  Result Value Ref Range Status   Specimen Description   Final    BLOOD LEFT HAND Performed at [facility], [address] [place], [place] [address]    Special Requests   Final    BOTTLES DRAWN AEROBIC ONLY Blood Culture adequate volume Performed at [facility] [address], [place], [place] [address]    Culture   Final    NO GROWTH 2 DAYS Performed at [facility] [address], [place], [place] [address]    Report Status PENDING  Incomplete  SARS Coronavirus 2 (CEPHEID- Performed in [facility] hospital lab), Hosp Order     Status: None   Collection Time: 11/07/18  7:28 PM   Specimen: Nasopharyngeal Swab  Result Value Ref Range Status   SARS Coronavirus 2 NEGATIVE NEGATIVE Final    Comment: (NOTE) If result is NEGATIVE SARS-CoV-2 target nucleic acids are NOT DETECTED. The SARS-CoV-2 RNA is generally detectable in upper and lower  respiratory specimens during  the acute phase of infection. The lowest  concentration of SARS-CoV-2 viral copies this assay can detect is 250  copies / mL. A negative result does not preclude SARS-CoV-2 infection  and should not be used as the sole basis for treatment or other  patient management decisions.  A negative result may occur with  improper specimen collection / handling, submission of specimen other  than nasopharyngeal swab, presence of viral mutation(s) within the  areas targeted by this assay, and inadequate number of viral copies  (<250 copies / mL). A negative result must be combined with clinical  observations, patient history, and epidemiological information. If result is POSITIVE SARS-CoV-2 target nucleic acids are DETECTED. The SARS-CoV-2 RNA is generally detectable in upper and lower  respiratory specimens dur ing the acute phase of infection.  Positive  results are indicative of active infection with SARS-CoV-2.  Clinical  correlation with patient history and other diagnostic information is  necessary to determine patient infection status.  Positive results do  not rule out bacterial infection or co-infection with other viruses. If result is PRESUMPTIVE POSTIVE SARS-CoV-2 nucleic acids MAY BE PRESENT.   A presumptive positive result was obtained on the submitted specimen  and confirmed on repeat testing.  While 2019 novel coronavirus  (SARS-CoV-2) nucleic acids may be present in the submitted sample  additional confirmatory testing may be necessary for epidemiological  and / or clinical management purposes  to differentiate between  SARS-CoV-2 and other Sarbecovirus currently known to infect humans.  If clinically indicated additional testing with an alternate test  methodology [id number]) is advised. The SARS-CoV-2 RNA is generally  detectable in upper and lower respiratory sp ecimens during the acute  phase of infection. The expected result is Negative. Fact Sheet for Patients:   StrictlyIdeas.no Fact Sheet for Healthcare Providers: BankingDealers.co.za This test is not yet approved or cleared by the [place] FDA and has been authorized for detection and/or diagnosis of SARS-CoV-2 by FDA under an Emergency Use Authorization (EUA).  This EUA will remain in effect (meaning this test can be used) for the duration of the COVID-19 declaration under Section 564(b)(1) of the Act, 21 U.S.C. section 360bbb-3(b)(1), unless the authorization is terminated or revoked sooner. Performed at [facility], [place] [address], [place], [place] [address]          Radiology Studies: Ct Abdomen Pelvis W Contrast  Result Date: 11/07/2018 CLINICAL DATA:  Nausea and vomiting with low-grade fever. Alcohol dependence. EXAM: CT ABDOMEN AND PELVIS WITH CONTRAST TECHNIQUE: Multidetector CT imaging of the abdomen and pelvis was performed using the standard protocol following bolus administration  of intravenous contrast. CONTRAST:  158mL OMNIPAQUE IOHEXOL 300 MG/ML  SOLN COMPARISON:  CT abdomen pelvis 09/09/2014 FINDINGS: LOWER CHEST: There is no basilar pleural or apical pericardial effusion. HEPATOBILIARY: The hepatic contours and density are normal. There is no intra- or extrahepatic biliary dilatation. There is cholelithiasis without acute inflammation. PANCREAS: The pancreatic parenchymal contours are normal and there is no ductal dilatation. There is no peripancreatic fluid collection. SPLEEN: Normal. ADRENALS/URINARY TRACT: --Adrenal glands: Normal. --Right kidney/ureter: No hydronephrosis, nephroureterolithiasis, perinephric stranding or solid renal mass. --Left kidney/ureter: No hydronephrosis, nephroureterolithiasis, perinephric stranding or solid renal mass. --Urinary bladder: Normal for degree of distention STOMACH/BOWEL: --Stomach/Duodenum: There is no hiatal hernia or other gastric abnormality. The duodenal course and  caliber are normal. --Small bowel: No dilatation or inflammation. --Colon: Anastomosis of the sigmoid colon. --Appendix: Normal. VASCULAR/LYMPHATIC: Normal course and caliber of the major abdominal vessels. No abdominal or pelvic lymphadenopathy. REPRODUCTIVE: Prostate obscured by streak artifact from right total hip arthroplasty. MUSCULOSKELETAL. There are unchanged compression deformities of T9, T12, L1 and L2. OTHER: None. IMPRESSION: 1. No acute abdominal or pelvic abnormality. 2. Anastomosis at the sigmoid colon. No abdominal mass or evidence of metastatic disease. 3. Cholelithiasis without acute cholecystitis. Electronically Signed   By: Ulyses Jarred M.D.   On: 11/07/2018 23:26   Dg Chest Port 1 View  Result Date: 11/07/2018 CLINICAL DATA:  Nausea and vomiting with body aches. EXAM: PORTABLE CHEST 1 VIEW COMPARISON:  Chest x-ray dated March 07, 2015 FINDINGS: There is an airspace opacity at the right lung base. The lung volumes are low. There is some blunting of the left costophrenic angle. Old healed right-sided rib fractures are noted. There is no pneumothorax. Old healed left-sided rib fractures are noted. IMPRESSION: Low lung volumes with bibasilar airspace opacities, right worse than left, which may represent atelectasis, infiltrate, or aspiration in the appropriate clinical setting. Electronically Signed   By: Constance Holster M.D.   On: 11/07/2018 19:45        Scheduled Meds:  divalproex  125 mg Oral BID   enoxaparin (LOVENOX) injection  40 mg Subcutaneous Daily   latanoprost  1 drop Both Eyes QHS   Continuous Infusions:   LOS: 1 day   Time spent= 25 mins    Sherby Moncayo Arsenio Loader, MD [facility]  If 7PM-7AM, please contact night-coverage [email or web address] 11/09/2018, 11:15 AM

## 2018-11-09 NOTE — Consult Note (Addendum)
Referring Provider: Dr. Gerlean Ren  Primary Care Physician:  Sande Brothers, MD Primary Gastroenterologist: Dr. Jerilynn Mages. Fuller Plan   Reason for Consultation: weight loss, nausea and vomiting   HPI: [name] is a 84 y.o. male with a past medical history of hypertension, GERD, colon cancer status post hemicolectomy 12/2014, seizures, alcohol dependence and dementia.  He resides at the [facility].  He has a history of intermittent vomiting which is typically controlled with Zofran.  However, he developed worsening vomiting on 11/07/2018, unable to keep any fluids or food down so he was sent to the emergency room for further evaluation. No abdominal pain.  ED course: BP 183/87, pulse 71, RR 16, temp 99.0 Fahrenheit, SPO2 99% on room air  Labs: Sodium 141.  Potassium 4.3.  Glucose 91.  Creatinine 0.97.  Alk phos 52.  Albumin 3.7.  Lipase 26.  AST 26.  ALT 22.  Total protein 7.2.  Total bilirubin 0.5.  Lactic acid 1.8.  WBC 7.3.  Hemoglobin 11.8.  Hematocrit 38.6.  MCV 100.3.  Platelet 161.  CT abdomen/pelvis: negative for acute abdominal or pelvic abnormality.  Anastomosis at the sigmoid colon was seen without evidence of abdominal mass or metastatic disease.  Cholelithiasis without evidence of cholecystitis was noted.   Colonoscopy 11/25/2015 by Dr. Fuller Plan: end-to-end colo-colonic anastomosis in the sigmoid colon intact, 5 mm TA polyp was found in the transverse colon. The polyp was sessile. The polyp was removed with a cold biopsy forceps.  Colonoscopy 09/07/2014 by Dr. Fuller Plan: Two pedunculated polyps in the ascending colon; polypectomies performed using snare cautery.Three sessile polyps in the transverse colon and ascending colon; polypectomies performed with a cold snare. Malignant appearing mass, in the transverse colon. Biopsies confirmed adenocarcinoma.  12/16/2014: Robot assisted extended left colectomy with splenic flexure mobilization by Dr. [name].  Currently, patient denies  having any abdominal pain. Day shift RN reports no vomiting overnight or thus far today. Tolerating Clear liquids. No BM. Labs today: Na 141. K 3.9. BUN 16. Cr. 1.06. Alk phos 32. AST 24. ALT 18. BNP 246. WBC 5.8. Hg 9.9. HCT 32.7.   Past Medical History:  Diagnosis Date   Alcohol dependence ([place])    Associated with thrombocytopenia, anemia, elevated LFTs, hypomagnesemia   Anemia    Arthritis    Colon cancer ([place])    Dementia (H[facility])    Vascular and alcohol related.   HTN (hypertension)    Seizures ([place])    Related to withdrawal, no seizure in years    Past Surgical History:  Procedure Laterality Date   COLONOSCOPY     HEMICOLECTOMY  12/2014   ORIF FEMORAL NECK FRACTURE W/ DHS      Prior to Admission medications   Medication Sig Start Date End Date Taking? Authorizing Provider  acetaminophen (TYLENOL) 500 MG tablet Take 500 mg by mouth 2 (two) times a day. Not to exceed 2000 mg in 24 hours.  Call MD if fever is above 101F    Yes [provider]  alum & mag hydroxide-simeth (MAALOX/MYLANTA) 200-200-20 MG/5ML suspension Take 30 mLs by mouth every 6 (six) hours as needed for indigestion or heartburn. Reported on 11/11/2015   Yes [provider]  divalproex (DEPAKOTE SPRINKLE) 125 MG capsule Take 125 mg by mouth 2 (two) times daily.  09/23/18  Yes [provider]  guaifenesin (ROBITUSSIN) 100 MG/5ML syrup Take 200 mg by mouth 4 (four) times daily as needed for cough. Reported on 11/11/2015   Yes [provider]  latanoprost (XALATAN) 0.005 % ophthalmic solution Place 1 drop into both eyes at bedtime. (if patient will tolerate) 10/08/18  Yes [provider]  loperamide (LOPERAMIDE A-D) 2 MG tablet Take 2 mg by mouth 4 (four) times daily as needed for diarrhea or loose stools. Reported on 11/11/2015   Yes [provider]  magnesium hydroxide (MILK OF MAGNESIA) 400 MG/5ML suspension Take 30 mLs by mouth at bedtime as needed for mild  constipation. Reported on 11/11/2015   Yes [provider]  neomycin-bacitracin-polymyxin (NEOSPORIN) ointment Apply 1 application topically See admin instructions. Reported on 11/11/2015   Yes [provider]  ondansetron (ZOFRAN) 4 MG tablet Take 4 mg by mouth every 8 (eight) hours as needed for nausea or vomiting. Reported on 11/11/2015   Yes [provider]    Current Facility-Administered Medications  Medication Dose Route Frequency Provider Last Rate Last Dose   acetaminophen (TYLENOL) tablet 650 mg  650 mg Oral Q6H PRN Lenore Cordia, MD       Or   acetaminophen (TYLENOL) suppository 650 mg  650 mg Rectal Q6H PRN Lenore Cordia, MD       alum & mag hydroxide-simeth (MAALOX/MYLANTA) 200-200-20 MG/5ML suspension 30 mL  30 mL Oral Q4H PRN Amin, Ankit Chirag, MD       divalproex (DEPAKOTE SPRINKLE) capsule 125 mg  125 mg Oral BID Zada Finders R, MD   125 mg at 11/08/18 2258   enoxaparin (LOVENOX) injection 40 mg  40 mg Subcutaneous Daily Lenore Cordia, MD   40 mg at 11/08/18 5643   guaiFENesin-dextromethorphan (ROBITUSSIN DM) 100-10 MG/5ML syrup 5 mL  5 mL Oral Q4H PRN Amin, Ankit Chirag, MD       hydrALAZINE (APRESOLINE) injection 10 mg  10 mg Intravenous Q4H PRN Amin, Ankit Chirag, MD       hydrocortisone (ANUSOL-HC) 2.5 % rectal cream 1 application  1 application Topical QID PRN Amin, Ankit Chirag, MD       hydrocortisone cream 1 % 1 application  1 application Topical TID PRN Amin, Ankit Chirag, MD       latanoprost (XALATAN) 0.005 % ophthalmic solution 1 drop  1 drop Both Eyes QHS Zada Finders R, MD   1 drop at 11/08/18 2258   lip balm (CARMEX) ointment 1 application  1 application Topical PRN Amin, Ankit Chirag, MD       loratadine (CLARITIN) tablet 10 mg  10 mg Oral Daily PRN Amin, Ankit Chirag, MD       Muscle Rub CREA 1 application  1 application Topical PRN Amin, Ankit Chirag, MD       ondansetron (ZOFRAN) tablet 4 mg  4 mg Oral Q6H PRN  Lenore Cordia, MD       Or   ondansetron (ZOFRAN) injection 4 mg  4 mg Intravenous Q6H PRN Patel, Vishal R, MD       phenol (CHLORASEPTIC) mouth spray 1 spray  1 spray Mouth/Throat PRN Amin, Ankit Chirag, MD       polyethylene glycol (MIRALAX / GLYCOLAX) packet 17 g  17 g Oral Daily PRN Amin, Ankit Chirag, MD       polyvinyl alcohol (LIQUIFILM TEARS) 1.4 % ophthalmic solution 1 drop  1 drop Both Eyes PRN Amin, Ankit Chirag, MD       senna-docusate (Senokot-S) tablet 2 tablet  2 tablet Oral QHS PRN Amin, Ankit Chirag, MD       sodium chloride (OCEAN) 0.65 % nasal  spray 1 spray  1 spray Each Nare PRN Damita Lack, MD        Allergies as of 11/07/2018   (No Known Allergies)    Family History  Problem Relation Age of Onset   Colon cancer Neg Hx     Social History   Socioeconomic History   Marital status: Single    Spouse name: Not on file   Number of children: 2   Years of education: Not on file   Highest education level: Not on file  Occupational History   Occupation: truck Education administrator: UNEMPLOYED  Social Designer, fashion/clothing strain: Not on file   Food insecurity    Worry: Not on file    Inability: Not on file   Transportation needs    Medical: Not on file    Non-medical: Not on file  Tobacco Use   Smoking status: Former Smoker    Packs/day: 1.00    Quit date: 12/14/1975    Years since quitting: 42.9   Smokeless tobacco: Never Used  Substance and Sexual Activity   Alcohol use: Yes    Alcohol/week: 0.0 standard drinks    Comment: USED TO DRINK BEER REGULARLY NOW DRINKS ABOUT 2 BEERS PER MONTH   Drug use: No   Sexual activity: Not on file  Lifestyle   Physical activity    Days per week: Not on file    Minutes per session: Not on file   Stress: Not on file  Relationships   Social connections    Talks on phone: Not on file    Gets together: Not on file    Attends religious service: Not on file    Active member of club or  organization: Not on file    Attends meetings of clubs or organizations: Not on file    Relationship status: Not on file   Intimate partner violence    Fear of current or ex partner: Not on file    Emotionally abused: Not on file    Physically abused: Not on file    Forced sexual activity: Not on file  Other Topics Concern   Not on file  Social History Narrative   Lives in facility, divorced and has no children, in past was noted to check himself out and come back with altered mental status post alcohol binge.   Currently at [facility] in [place] are family friends   Retired from Therapist, sports truck for [employer]   Has dementia    Review of Systems: Unable to obtain ROS due to patient has dementia, no family at bed side. He denies having abdominal pain.  Physical Exam: Vital signs in last 24 hours: Temp:  [97.6 F (36.4 C)-98.9 F (37.2 C)] 98.2 F (36.8 C) (06/27 0516) Pulse Rate:  [63-65] 63 (06/27 0516) Resp:  [14-17] 14 (06/27 0516) BP: (108-134)/(51-79) 134/58 (06/27 0516) SpO2:  [98 %-100 %] 98 % (06/27 0516) Last BM Date: (PTA) General:   Alert to name, in NAD. Head:  Normocephalic and atraumatic. Eyes:  Sclera clear, no icterus. Conjunctiva pink. Ears:  Normal auditory acuity. Nose:  No deformity, discharge or lesions. Mouth:  No deformity or lesions.   Neck:  Supple. Lungs:  Clear but diminished throughout, patient unable to take deep inspiration on demand. Heart:  RRR, S1, S2 no murmurs.  Abdomen:  Soft, nondistended, nontender, + BS x 4 quads, no HSM. Laparoscopic scars intact.  Rectal:  Deferred  Msk:  Symmetrical without gross deformities. . Pulses:  Normal pulses noted. Extremities:  Without clubbing or edema. Neurologic:  Alert to name, follows commands, answers yes/no questions, moves all extremities.  Skin:  Intact without significant lesions or rashes.. Psych:  Alert and cooperative. Normal mood and  affect.  Intake/Output from previous day: 06/26 0701 - 06/27 0700 In: 60 [P.O.:60] Out: 825 [Urine:825] Intake/Output this shift: No intake/output data recorded.  Lab Results: Recent Labs    11/07/18 1917 11/08/18 0319 11/09/18 0250  WBC 7.3 7.9 5.8  HGB 11.8* 11.4* 9.9*  HCT 38.6* 35.5* 32.7*  PLT 161 160 159   BMET Recent Labs    11/07/18 1917 11/08/18 0319 11/09/18 0250  NA 141 141 141  K 4.3 4.3 3.9  CL 108 109 110  CO2 _0 GLUCOSE 91 82 68*  BUN _1 CREATININE 0.97 1.03 1.06  CALCIUM 8.9 8.7* 8.1*   LFT Recent Labs    11/09/18 0250  PROT 5.5*  ALBUMIN 2.9*  AST 24  ALT 18  ALKPHOS 32*  BILITOT 0.6     Studies/Results: Ct Abdomen Pelvis W Contrast  Result Date: 11/07/2018 CLINICAL DATA:  Nausea and vomiting with low-grade fever. Alcohol dependence. EXAM: CT ABDOMEN AND PELVIS WITH CONTRAST TECHNIQUE: Multidetector CT imaging of the abdomen and pelvis was performed using the standard protocol following bolus administration of intravenous contrast. CONTRAST:  132m OMNIPAQUE IOHEXOL 300 MG/ML  SOLN COMPARISON:  CT abdomen pelvis 09/09/2014 FINDINGS: LOWER CHEST: There is no basilar pleural or apical pericardial effusion. HEPATOBILIARY: The hepatic contours and density are normal. There is no intra- or extrahepatic biliary dilatation. There is cholelithiasis without acute inflammation. PANCREAS: The pancreatic parenchymal contours are normal and there is no ductal dilatation. There is no peripancreatic fluid collection. SPLEEN: Normal. ADRENALS/URINARY TRACT: --Adrenal glands: Normal. --Right kidney/ureter: No hydronephrosis, nephroureterolithiasis, perinephric stranding or solid renal mass. --Left kidney/ureter: No hydronephrosis, nephroureterolithiasis, perinephric stranding or solid renal mass. --Urinary bladder: Normal for degree of distention STOMACH/BOWEL: --Stomach/Duodenum: There is no hiatal hernia or other gastric abnormality. The duodenal  course and caliber are normal. --Small bowel: No dilatation or inflammation. --Colon: Anastomosis of the sigmoid colon. --Appendix: Normal. VASCULAR/LYMPHATIC: Normal course and caliber of the major abdominal vessels. No abdominal or pelvic lymphadenopathy. REPRODUCTIVE: Prostate obscured by streak artifact from right total hip arthroplasty. MUSCULOSKELETAL. There are unchanged compression deformities of T9, T12, L1 and L2. OTHER: None. IMPRESSION: 1. No acute abdominal or pelvic abnormality. 2. Anastomosis at the sigmoid colon. No abdominal mass or evidence of metastatic disease. 3. Cholelithiasis without acute cholecystitis. Electronically Signed   By: KUlyses JarredM.D.   On: 11/07/2018 23:26   Dg Chest Port 1 View  Result Date: 11/07/2018 CLINICAL DATA:  Nausea and vomiting with body aches. EXAM: PORTABLE CHEST 1 VIEW COMPARISON:  Chest x-ray dated March 07, 2015 FINDINGS: There is an airspace opacity at the right lung base. The lung volumes are low. There is some blunting of the left costophrenic angle. Old healed right-sided rib fractures are noted. There is no pneumothorax. Old healed left-sided rib fractures are noted. IMPRESSION: Low lung volumes with bibasilar airspace opacities, right worse than left, which may represent atelectasis, infiltrate, or aspiration in the appropriate clinical setting. Electronically Signed   By: CConstance HolsterM.D.   On: 11/07/2018 19:45    IMPRESSION/PLAN:   1. 84y.o. male with N/V controlled with Zofran. CTAP showed cholelithiasis without cholecystitis otherwise negative. Abdominal sonogram in process at this  time. Lipase and LFTs wnls. -await abd sono results -follow CMP, CBC -monitor for further N/V and abdominal pain -No plans for endoscopic evaluation at this time -clear liquids for now -IVF per hospitalist -Zofran as needed  -would add Famotidine if N/V persist  2. 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 Most recent  surveillance colonoscopy was 11/25/2015, one TA polyp removed from the transverse colon.  3. Macrocytic anemia. Admission Hg 11.8. Today Hg 9.9. HCT 32.7. ? Dilutional. -check FOBT with next BM -repeat CBC with iron studies, Vitamin B 12 in am 6/28 -if iron deficient will consider EGD/colonoscopy   4. Past alcohol dependence    5. ? Concerns for aspiration pneumonia, started on Unasyn. No cough or SOB. WBC 5.8. Afebrile.   Further recommendations per Dr. Claudina Lick Dorathy Daft  11/09/2018, 9:40 AM     Attending physician's note   I have reviewed the chart and examined the patient. I agree with the Advanced Practitioner's note, impression and recommendations.  History not obtained from the patient due to dementia.  84 year old NH resident with advanced dementia, HTN, GERD, colon CA s/p L hemicolectomy 12/2014 (neg colon 11/2015), seizures, H/O ETOH, admitted with nausea/vomiting. CTAP/US showed cholelithiasis without cholecystitis/biliary ductal dilatation.Nl LFTs/lipase.  N/V has resolved per nursing staff.  Patient at baseline. No GI bleeding. Has macrocytic anemia.  Plan: -Symptomatic treatment for N/V.  It appears to have resolved with Zofran. -Would like to hold off on EGD at the present time d/t comorbid conditions and clinical improvement. -Continue supportive treatment for now. -Feed in sitting position. -We will follow along with you in case he develops recurrent problems.  Carmell Austria, MD [facility] [phone].

## 2018-11-10 LAB — COMPREHENSIVE METABOLIC PANEL
ALT: 27 U/L (ref 0–44)
AST: 47 U/L — ABNORMAL HIGH (ref 15–41)
Albumin: 3.4 g/dL — ABNORMAL LOW (ref 3.5–5.0)
Alkaline Phosphatase: 40 U/L (ref 38–126)
Anion gap: 13 (ref 5–15)
BUN: 15 mg/dL (ref 8–23)
CO2: 21 mmol/L — ABNORMAL LOW (ref 22–32)
Calcium: 8.6 mg/dL — ABNORMAL LOW (ref 8.9–10.3)
Chloride: 103 mmol/L (ref 98–111)
Creatinine, Ser: 1.02 mg/dL (ref 0.61–1.24)
GFR calc Af Amer: >60 mL/min (ref >60)
GFR calc non Af Amer: >60 mL/min (ref >60)
Glucose, Bld: 72 mg/dL (ref 70–99)
Potassium: 3.9 mmol/L (ref 3.5–5.1)
Sodium: 137 mmol/L (ref 135–145)
Total Bilirubin: 0.9 mg/dL (ref 0.3–1.2)
Total Protein: 6.7 g/dL (ref 6.5–8.1)

## 2018-11-10 LAB — FERRITIN: Ferritin: 1038 ng/mL — ABNORMAL HIGH (ref 24–336)

## 2018-11-10 LAB — CBC
HCT: 37.2 % — ABNORMAL LOW (ref 39.0–52.0)
Hemoglobin: 11.3 g/dL — ABNORMAL LOW (ref 13.0–17.0)
MCH: 29.9 pg (ref 26.0–34.0)
MCHC: 30.4 g/dL (ref 30.0–36.0)
MCV: 98.4 fL (ref 80.0–100.0)
Platelets: 174 10*3/uL (ref 150–400)
RBC: 3.78 MIL/uL — ABNORMAL LOW (ref 4.22–5.81)
RDW: 11.2 % — ABNORMAL LOW (ref 11.5–15.5)
WBC: 6.8 10*3/uL (ref 4.0–10.5)
nRBC: 0 % (ref 0.0–0.2)

## 2018-11-10 LAB — IRON AND TIBC
Iron: 161 ug/dL (ref 45–182)
Saturation Ratios: 56 % — ABNORMAL HIGH (ref 17.9–39.5)
TIBC: 288 ug/dL (ref 250–450)
UIBC: 127 ug/dL

## 2018-11-10 LAB — VITAMIN B12: Vitamin B-12: 155 pg/mL — ABNORMAL LOW (ref 180–914)

## 2018-11-10 LAB — MAGNESIUM: Magnesium: 1.8 mg/dL (ref 1.7–2.4)

## 2018-11-10 MED ORDER — VITAMIN B-12 1000 MCG PO TABS
1000.0000 ug | ORAL_TABLET | Freq: Every day | ORAL | Status: DC
  Administered 2018-11-10 – 2018-11-11 (×2): 1000 ug via ORAL
  Filled 2018-11-10 (×2): qty 1

## 2018-11-10 MED ORDER — CYANOCOBALAMIN 1000 MCG PO TABS: 1000.0000 ug | ORAL_TABLET | Freq: Every day | ORAL | 0 refills | Status: AC

## 2018-11-10 NOTE — NC FL2 (Deleted)
[place] LEVEL OF CARE SCREENING TOOL     IDENTIFICATION  Patient Name: [name] Sites Birthdate: Feb 02, 1935 Sex: male Admission Date (Current Location): 11/07/2018  [facility] and [place] Number:  Herbalist and Address:  [facility],  [place] [address] [place]      Provider Number: [id number]  Attending Physician Name and Address:  Damita Lack, MD  Relative Name and Phone Number:  Daugher [phone]    Current Level of Care: Hospital Recommended Level of Care: Memory Care Prior Approval Number:    Date Approved/Denied:   PASRR Number:    Discharge Plan: Other (Comment)(Memory Care)    Current Diagnoses: Patient Active Problem List   Diagnosis Date Noted  . Aspiration pneumonitis ([place]) 11/08/2018  . Aspiration pneumonia ([place]) 11/08/2018  . Dementia ([place])   . History of alcohol use disorder   . History of seizures   . Nausea and vomiting   . Rib fracture 03/16/2015  . Anemia 03/16/2015  . Benign essential HTN 12/24/2014  . 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[place]) 12/16/2014    Orientation RESPIRATION BLADDER Height & Weight     (Memory Impaired)  Normal Continent Weight: 132 lb 4.4 oz (60 kg) Height:  (Unknown)  BEHAVIORAL SYMPTOMS/MOOD NEUROLOGICAL BOWEL NUTRITION STATUS      Continent Diet  AMBULATORY STATUS COMMUNICATION OF NEEDS Skin   Supervision Verbally Normal                       Personal Care Assistance Level of Assistance  Bathing, Feeding, Dressing Bathing Assistance: Limited assistance Feeding assistance: Independent Dressing Assistance: Independent     Functional Limitations Info  Sight, Hearing, Speech Sight Info: Adequate Hearing Info: Adequate Speech Info: Adequate    SPECIAL CARE FACTORS FREQUENCY  Speech therapy                    Contractures Contractures Info: Not present    Additional Factors Info  Code Status, Allergies, Psychotropic Code Status Info:  Fullcode Allergies Info: Allergies: No Known Allergies Psychotropic Info: Depakote Sprinkle 125mg          Current Medications (11/10/2018):  This is the current hospital active medication list Current Facility-Administered Medications  Medication Dose Route Frequency Provider Last Rate Last Dose  . acetaminophen (TYLENOL) tablet 650 mg  650 mg Oral Q6H PRN Lenore Cordia, MD       Or  . acetaminophen (TYLENOL) suppository 650 mg  650 mg Rectal Q6H PRN Lenore Cordia, MD      . alum & mag hydroxide-simeth (MAALOX/MYLANTA) 200-200-20 MG/5ML suspension 30 mL  30 mL Oral Q4H PRN Amin, Ankit Chirag, MD      . divalproex (DEPAKOTE SPRINKLE) capsule 125 mg  125 mg Oral BID Lenore Cordia, MD   125 mg at 11/10/18 1039  . enoxaparin (LOVENOX) injection 40 mg  40 mg Subcutaneous Daily Zada Finders R, MD   40 mg at 11/10/18 1039  . guaiFENesin-dextromethorphan (ROBITUSSIN DM) 100-10 MG/5ML syrup 5 mL  5 mL Oral Q4H PRN Amin, Ankit Chirag, MD      . hydrALAZINE (APRESOLINE) injection 10 mg  10 mg Intravenous Q4H PRN Amin, Ankit Chirag, MD      . hydrocortisone (ANUSOL-HC) 2.5 % rectal cream 1 application  1 application Topical QID PRN Amin, Ankit Chirag, MD      . hydrocortisone cream 1 % 1 application  1 application Topical TID PRN Amin, Ankit  Chirag, MD      . latanoprost (XALATAN) 0.005 % ophthalmic solution 1 drop  1 drop Both Eyes QHS Lenore Cordia, MD   1 drop at 11/09/18 2140  . lip balm (CARMEX) ointment 1 application  1 application Topical PRN Amin, Ankit Chirag, MD      . loratadine (CLARITIN) tablet 10 mg  10 mg Oral Daily PRN Amin, Ankit Chirag, MD      . Muscle Rub CREA 1 application  1 application Topical PRN Amin, Ankit Chirag, MD      . ondansetron (ZOFRAN) tablet 4 mg  4 mg Oral Q6H PRN Lenore Cordia, MD       Or  . ondansetron (ZOFRAN) injection 4 mg  4 mg Intravenous Q6H PRN Patel, Vishal R, MD      . phenol (CHLORASEPTIC) mouth spray 1 spray  1 spray Mouth/Throat PRN Amin,  Ankit Chirag, MD      . polyethylene glycol (MIRALAX / GLYCOLAX) packet 17 g  17 g Oral Daily PRN Amin, Ankit Chirag, MD      . polyvinyl alcohol (LIQUIFILM TEARS) 1.4 % ophthalmic solution 1 drop  1 drop Both Eyes PRN Amin, Ankit Chirag, MD      . senna-docusate (Senokot-S) tablet 2 tablet  2 tablet Oral QHS PRN Amin, Ankit Chirag, MD      . sodium chloride (OCEAN) 0.65 % nasal spray 1 spray  1 spray Each Nare PRN Amin, Ankit Chirag, MD      . vitamin B-12 (CYANOCOBALAMIN) tablet 1,000 mcg  1,000 mcg Oral Daily Amin, Ankit Chirag, MD   1,000 mcg at 11/10/18 1039     Discharge Medications: Please see discharge summary for a list of discharge medications.  Relevant Imaging Results:  Relevant Lab Results:   Additional Information [id number]  , [place], [place]

## 2018-11-10 NOTE — Discharge Summary (Signed)
Physician Discharge Summary  [name] ZJI:[id number] DOB: 07-27-1934 DOA: 11/07/2018  PCP: Sande Brothers, MD  Admit date: 11/07/2018 Discharge date: 11/10/2018  Admitted From: ALF Disposition: ALF  Recommendations for Outpatient Follow-up:  1. Follow up with PCP in 1-2 weeks 2. Please obtain BMP/CBC in one week your next doctors visit.  3. Use basic aspiration precautions. 4. If nausea and vomiting persist, follow-up outpatient with gastroenterology. 5. Recommend thin liquid diet, hold meds with pure.  Discharge Condition: Stable CODE STATUS:  Diet recommendation:   Brief/Interim Summary: 84 year old with history of essential hypertension, advanced dementia, alcohol disorder, seizure disorder, colon cancer status post hemicolectomy 12/2014, GERD came to the hospital for evaluation of intermittent nausea and vomiting with poor oral intake.  He was admitted for concerns of aspiration pneumonitis on antibiotics.  Was evaluated by speech and swallow,.  He has poor desire to eat and drink due to nausea.  GI team was consulted but no further work-up was recommended at this point.  Advised to continue monitoring him.  Patient is tolerating some oral diet and advised to use basic aspiration precautions.  Can follow-up outpatient GI if the issues persist.  Otherwise treat his nausea symptomatically with Zofran. Stable for discharge back to his assisted living facility today.   Discharge Diagnoses:  Principal Problem:   Nausea and vomiting Active Problems:   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HCC)   Benign essential HTN   Aspiration pneumonitis (HCC)   Dementia (HCC)   History of alcohol use disorder   History of seizures   Aspiration pneumonia (HCC)   Persistent nausea and vomiting, unclear etiology Gallstone without acute cholangitis - CT of the abdomen pelvis shows gallstones without acute cholangitis.  LFTs are normal.Right upper quadrant ultrasound-negative - GI team input appreciated-  continue supportive management.  No in-hospital work-up/intervention indicated.  Follow-up outpatient as necessary. -Encourage oral diet  Aspiration pneumonitis -Seen by speech and swallow-patient is able to feed himself.  Advised to use basic aspiration precautions.  History of colon cancer status post left hemicolectomy in 2016 -Has anastomosis of sigmoid colon.  Continue supportive care  History of advanced vascular dementia -Precautions supportive care  History of alcohol withdrawal seizures and dependence -On Depakote.  On withdrawal protocol.   Consultations:  GI  Subjective: Seen at bedside this morning he is awake feeding himself.  Denies any complaints.  Denies any nausea vomiting.  Discharge Exam: Vitals:   11/09/18 2119 11/10/18 0345  BP: (!) 176/71 (!) 175/70  Pulse: (!) 57 (!) 57  Resp: 16 16  Temp: 98.2 F (36.8 C) (!) 97.5 F (36.4 C)  SpO2: 98% 99%   Vitals:   11/09/18 1431 11/09/18 2119 11/10/18 0345 11/10/18 0351  BP: (!) 153/77 (!) 176/71 (!) 175/70   Pulse: 64 (!) 57 (!) 57   Resp:  16 16   Temp: 98.3 F (36.8 C) 98.2 F (36.8 C) (!) 97.5 F (36.4 C)   TempSrc: Oral Oral Oral   SpO2: 98% 98% 99%   Weight:    60 kg    General: Pt is alert, awake, not in acute distress Cardiovascular: RRR, S1/S2 +, no rubs, no gallops Respiratory: CTA bilaterally, no wheezing, no rhonchi Abdominal: Soft, NT, ND, bowel sounds + Extremities: no edema, no cyanosis  Discharge Instructions  Discharge Instructions    Call MD for:  difficulty breathing, headache or visual disturbances   Complete by: As directed    Call MD for:  persistant dizziness or light-headedness   Complete  by: As directed    Diet - low sodium heart healthy   Complete by: As directed    Discharge instructions   Complete by: As directed    You were cared for by a hospitalist during your hospital stay. If you have any questions about your discharge medications or the care you  received while you were in the hospital after you are discharged, you can call the unit and asked to speak with the hospitalist on call if the hospitalist that took care of you is not available. Once you are discharged, your primary care physician will handle any further medical issues. Please note that NO REFILLS for any discharge medications will be authorized once you are discharged, as it is imperative that you return to your primary care physician (or establish a relationship with a primary care physician if you do not have one) for your aftercare needs so that they can reassess your need for medications and monitor your lab values.  Please request your Prim.MD to go over all Hospital Tests and Procedure/Radiological results at the follow up, please get all Hospital records sent to your Prim MD by signing hospital release before you go home.  Get CBC, CMP, 2 view Chest X ray checked  by Primary MD during your next visit or SNF MD in 5-7 days ( we routinely change or add medications that can affect your baseline labs and fluid status, therefore we recommend that you get the mentioned basic workup next visit with your PCP, your PCP may decide not to get them or add new tests based on their clinical decision)  On your next visit with your primary care physician please Get Medicines reviewed and adjusted.  If you experience worsening of your admission symptoms, develop shortness of breath, life threatening emergency, suicidal or homicidal thoughts you must seek medical attention immediately by calling 911 or calling your MD immediately  if symptoms less severe.  You Must read complete instructions/literature along with all the possible adverse reactions/side effects for all the Medicines you take and that have been prescribed to you. Take any new Medicines after you have completely understood and accpet all the possible adverse reactions/side effects.   Do not drive, operate heavy machinery, perform  activities at heights, swimming or participation in water activities or provide baby sitting services if your were admitted for syncope or siezures until you have seen by Primary MD or a Neurologist and advised to do so again.  Do not drive when taking Pain medications.   Increase activity slowly   Complete by: As directed      Allergies as of 11/10/2018   No Known Allergies     Medication List    TAKE these medications   acetaminophen 500 MG tablet Commonly known as: TYLENOL Take 500 mg by mouth 2 (two) times a day. Not to exceed 2000 mg in 24 hours.  Call MD if fever is above 101F   alum & mag hydroxide-simeth [phone] MG/5ML suspension Commonly known as: MAALOX/MYLANTA Take 30 mLs by mouth every 6 (six) hours as needed for indigestion or heartburn. Reported on 11/11/2015   cyanocobalamin 1000 MCG tablet Take 1 tablet (1,000 mcg total) by mouth daily.   divalproex 125 MG capsule Commonly known as: DEPAKOTE SPRINKLE Take 125 mg by mouth 2 (two) times daily.   guaifenesin 100 MG/5ML syrup Commonly known as: ROBITUSSIN Take 200 mg by mouth 4 (four) times daily as needed for cough. Reported on 11/11/2015   latanoprost 0.005 %  ophthalmic solution Commonly known as: XALATAN Place 1 drop into both eyes at bedtime. (if patient will tolerate)   Loperamide A-D 2 MG tablet Generic drug: loperamide Take 2 mg by mouth 4 (four) times daily as needed for diarrhea or loose stools. Reported on 11/11/2015   magnesium hydroxide 400 MG/5ML suspension Commonly known as: MILK OF MAGNESIA Take 30 mLs by mouth at bedtime as needed for mild constipation. Reported on 11/11/2015   neomycin-bacitracin-polymyxin ointment Commonly known as: NEOSPORIN Apply 1 application topically See admin instructions. Reported on 11/11/2015   ondansetron 4 MG tablet Commonly known as: ZOFRAN Take 4 mg by mouth every 8 (eight) hours as needed for nausea or vomiting. Reported on 11/11/2015      Follow-up  Information    Sande Brothers, MD Follow up in 1 week(s).   Specialty: Internal Medicine Contact information: [address] [place] [address] [phone]          No Known Allergies  You were cared for by a hospitalist during your hospital stay. If you have any questions about your discharge medications or the care you received while you were in the hospital after you are discharged, you can call the unit and asked to speak with the hospitalist on call if the hospitalist that took care of you is not available. Once you are discharged, your primary care physician will handle any further medical issues. Please note that no refills for any discharge medications will be authorized once you are discharged, as it is imperative that you return to your primary care physician (or establish a relationship with a primary care physician if you do not have one) for your aftercare needs so that they can reassess your need for medications and monitor your lab values.   Procedures/Studies: Ct Abdomen Pelvis W Contrast  Result Date: 11/07/2018 CLINICAL DATA:  Nausea and vomiting with low-grade fever. Alcohol dependence. EXAM: CT ABDOMEN AND PELVIS WITH CONTRAST TECHNIQUE: Multidetector CT imaging of the abdomen and pelvis was performed using the standard protocol following bolus administration of intravenous contrast. CONTRAST:  163mL OMNIPAQUE IOHEXOL 300 MG/ML  SOLN COMPARISON:  CT abdomen pelvis 09/09/2014 FINDINGS: LOWER CHEST: There is no basilar pleural or apical pericardial effusion. HEPATOBILIARY: The hepatic contours and density are normal. There is no intra- or extrahepatic biliary dilatation. There is cholelithiasis without acute inflammation. PANCREAS: The pancreatic parenchymal contours are normal and there is no ductal dilatation. There is no peripancreatic fluid collection. SPLEEN: Normal. ADRENALS/URINARY TRACT: --Adrenal glands: Normal. --Right kidney/ureter: No hydronephrosis,  nephroureterolithiasis, perinephric stranding or solid renal mass. --Left kidney/ureter: No hydronephrosis, nephroureterolithiasis, perinephric stranding or solid renal mass. --Urinary bladder: Normal for degree of distention STOMACH/BOWEL: --Stomach/Duodenum: There is no hiatal hernia or other gastric abnormality. The duodenal course and caliber are normal. --Small bowel: No dilatation or inflammation. --Colon: Anastomosis of the sigmoid colon. --Appendix: Normal. VASCULAR/LYMPHATIC: Normal course and caliber of the major abdominal vessels. No abdominal or pelvic lymphadenopathy. REPRODUCTIVE: Prostate obscured by streak artifact from right total hip arthroplasty. MUSCULOSKELETAL. There are unchanged compression deformities of T9, T12, L1 and L2. OTHER: None. IMPRESSION: 1. No acute abdominal or pelvic abnormality. 2. Anastomosis at the sigmoid colon. No abdominal mass or evidence of metastatic disease. 3. Cholelithiasis without acute cholecystitis. Electronically Signed   By: Ulyses Jarred M.D.   On: 11/07/2018 23:26   Dg Chest Port 1 View  Result Date: 11/07/2018 CLINICAL DATA:  Nausea and vomiting with body aches. EXAM: PORTABLE CHEST 1 VIEW COMPARISON:  Chest x-ray  dated March 07, 2015 FINDINGS: There is an airspace opacity at the right lung base. The lung volumes are low. There is some blunting of the left costophrenic angle. Old healed right-sided rib fractures are noted. There is no pneumothorax. Old healed left-sided rib fractures are noted. IMPRESSION: Low lung volumes with bibasilar airspace opacities, right worse than left, which may represent atelectasis, infiltrate, or aspiration in the appropriate clinical setting. Electronically Signed   By: Constance Holster M.D.   On: 11/07/2018 19:45   US Abdomen Limited Ruq  Result Date: 11/09/2018 CLINICAL DATA:  Abdominal pain with vomiting EXAM: ULTRASOUND ABDOMEN LIMITED RIGHT UPPER QUADRANT COMPARISON:  CT abdomen and pelvis November 07, 2018  FINDINGS: Gallbladder: Within the gallbladder, there are echogenic foci which move and shadow consistent with cholelithiasis. Largest gallstone measures 1.3 cm in length. There is no appreciable gallbladder wall thickening or pericholecystic fluid. No sonographic Murphy sign noted by sonographer. Common bile duct: Diameter: 6 mm. No intrahepatic or extrahepatic biliary duct dilatation. Liver: No focal lesion identified. Liver echogenicity is increased. Portal vein is patent on color Doppler imaging with normal direction of blood flow towards the liver. IMPRESSION: 1. Cholelithiasis. No gallbladder wall thickening or pericholecystic fluid. 2. Increased liver echogenicity, a finding felt to represent hepatic steatosis. No focal liver lesions are demonstrable on this study. Electronically Signed   By: Lowella Grip III M.D.   On: 11/09/2018 12:03      The results of significant diagnostics from this hospitalization (including imaging, microbiology, ancillary and laboratory) are listed below for reference.     Microbiology: Recent Results (from the past 240 hour(s))  Culture, blood (routine x 2)     Status: None (Preliminary result)   Collection Time: 11/07/18  7:18 PM   Specimen: BLOOD LEFT ARM  Result Value Ref Range Status   Specimen Description   Final    BLOOD LEFT ARM Performed at [facility], [address] [place], [place] [address]    Special Requests   Final    BOTTLES DRAWN AEROBIC ONLY Blood Culture results may not be optimal due to an inadequate volume of blood received in culture bottles Performed at [facility] [address], [place], [place] [address]    Culture   Final    NO GROWTH 3 DAYS Performed at [facility], [place] [address], [place], [place] [address]    Report Status PENDING  Incomplete  Culture, blood (routine x 2)     Status: None (Preliminary result)   Collection Time: 11/07/18  7:23 PM   Specimen: BLOOD LEFT HAND  Result  Value Ref Range Status   Specimen Description   Final    BLOOD LEFT HAND Performed at [facility], [place] [address], [place], [place] [address]    Special Requests   Final    BOTTLES DRAWN AEROBIC ONLY Blood Culture adequate volume Performed at [facility] [address], [place], [place] [address]    Culture   Final    NO GROWTH 3 DAYS Performed at [facility], [place] [address], [place],  [address]    Report Status PENDING  Incomplete  SARS Coronavirus 2 (CEPHEID- Performed in [facility] hospital lab), Hosp Order     Status: None   Collection Time: 11/07/18  7:28 PM   Specimen: Nasopharyngeal Swab  Result Value Ref Range Status   SARS Coronavirus 2 NEGATIVE NEGATIVE Final    Comment: (NOTE) If  result is NEGATIVE SARS-CoV-2 target nucleic acids are NOT DETECTED. The SARS-CoV-2 RNA is generally detectable in upper and lower  respiratory specimens during the acute phase of infection. The lowest  concentration of SARS-CoV-2 viral copies this assay can detect is 250  copies / mL. A negative result does not preclude SARS-CoV-2 infection  and should not be used as the sole basis for treatment or other  patient management decisions.  A negative result may occur with  improper specimen collection / handling, submission of specimen other  than nasopharyngeal swab, presence of viral mutation(s) within the  areas targeted by this assay, and inadequate number of viral copies  (<250 copies / mL). A negative result must be combined with clinical  observations, patient history, and epidemiological information. If result is POSITIVE SARS-CoV-2 target nucleic acids are DETECTED. The SARS-CoV-2 RNA is generally detectable in upper and lower  respiratory specimens dur ing the acute phase of infection.  Positive  results are indicative of active infection with SARS-CoV-2.  Clinical  correlation with patient history and other diagnostic information is   necessary to determine patient infection status.  Positive results do  not rule out bacterial infection or co-infection with other viruses. If result is PRESUMPTIVE POSTIVE SARS-CoV-2 nucleic acids MAY BE PRESENT.   A presumptive positive result was obtained on the submitted specimen  and confirmed on repeat testing.  While 2019 novel coronavirus  (SARS-CoV-2) nucleic acids may be present in the submitted sample  additional confirmatory testing may be necessary for epidemiological  and / or clinical management purposes  to differentiate between  SARS-CoV-2 and other Sarbecovirus currently known to infect humans.  If clinically indicated additional testing with an alternate test  methodology [phone]) is advised. The SARS-CoV-2 RNA is generally  detectable in upper and lower respiratory sp ecimens during the acute  phase of infection. The expected result is Negative. Fact Sheet for Patients:  StrictlyIdeas.no Fact Sheet for Healthcare Providers: BankingDealers.co.za This test is not yet approved or cleared by the [place] FDA and has been authorized for detection and/or diagnosis of SARS-CoV-2 by FDA under an Emergency Use Authorization (EUA).  This EUA will remain in effect (meaning this test can be used) for the duration of the COVID-19 declaration under Section 564(b)(1) of the Act, 21 U.S.C. section 360bbb-3(b)(1), unless the authorization is terminated or revoked sooner. Performed at [facility], [place] [address] [place], [place] [address]      Labs: BNP (last 3 results) Recent Labs    11/09/18 0250  BNP 580.9*   Basic Metabolic Panel: Recent Labs  Lab 11/07/18 1917 11/08/18 0319 11/09/18 0250 11/10/18 0330  NA 141 141 141 137  K 4.3 4.3 3.9 3.9  CL 108 109 110 103  CO2 26 24 22  21*  GLUCOSE 91 82 68* 72  BUN 20 20 16 15   CREATININE 0.97 1.03 1.06 1.02  CALCIUM 8.9 8.7* 8.1* 8.6*  MG  --    --  1.8 1.8   Liver Function Tests: Recent Labs  Lab 11/07/18 1917 11/09/18 0250 11/10/18 0330  AST 26 24 47*  ALT 22 18 27   ALKPHOS 52 32* 40  BILITOT 0.5 0.6 0.9  PROT 7.2 5.5* 6.7  ALBUMIN 3.7 2.9* 3.4*   Recent Labs  Lab 11/07/18 1917  LIPASE 26   No results for input(s): AMMONIA in the last 168 hours. CBC: Recent Labs  Lab 11/07/18 1917 11/08/18 0319 11/09/18 0250 11/10/18 0330  WBC 7.3 7.9 5.8  6.8  NEUTROABS 4.1  --   --   --   HGB 11.8* 11.4* 9.9* 11.3*  HCT 38.6* 35.5* 32.7* 37.2*  MCV 100.3* 96.7 100.3* 98.4  PLT 161 160 159 174   Cardiac Enzymes: No results for input(s): CKTOTAL, CKMB, CKMBINDEX, TROPONINI in the last 168 hours. BNP: Invalid input(s): POCBNP CBG: No results for input(s): GLUCAP in the last 168 hours. D-Dimer No results for input(s): DDIMER in the last 72 hours. Hgb A1c No results for input(s): HGBA1C in the last 72 hours. Lipid Profile No results for input(s): CHOL, HDL, LDLCALC, TRIG, CHOLHDL, LDLDIRECT in the last 72 hours. Thyroid function studies No results for input(s): TSH, T4TOTAL, T3FREE, THYROIDAB in the last 72 hours.  Invalid input(s): FREET3 Anemia work up Recent Labs    11/10/18 0330  VITAMINB12 155*  FERRITIN 1,038*  TIBC 288  IRON 161   Urinalysis    Component Value Date/Time   COLORURINE YELLOW 03/20/2011 0407   APPEARANCEUR CLOUDY (A) 03/20/2011 0407   LABSPEC 1.019 03/20/2011 0407   PHURINE 5.5 03/20/2011 0407   GLUCOSEU NEGATIVE 03/20/2011 0407   HGBUR SMALL (A) 03/20/2011 0407   BILIRUBINUR NEGATIVE 03/20/2011 0407   KETONESUR 15 (A) 03/20/2011 0407   PROTEINUR 30 (A) 03/20/2011 0407   UROBILINOGEN 1.0 03/20/2011 0407   NITRITE NEGATIVE 03/20/2011 0407   LEUKOCYTESUR SMALL (A) 03/20/2011 0407   Sepsis Labs Invalid input(s): PROCALCITONIN,  WBC,  LACTICIDVEN Microbiology Recent Results (from the past 240 hour(s))  Culture, blood (routine x 2)     Status: None (Preliminary result)    Collection Time: 11/07/18  7:18 PM   Specimen: BLOOD LEFT ARM  Result Value Ref Range Status   Specimen Description   Final    BLOOD LEFT ARM Performed at [facility], [place] [address], [place], [place] [address]    Special Requests   Final    BOTTLES DRAWN AEROBIC ONLY Blood Culture results may not be optimal due to an inadequate volume of blood received in culture bottles Performed at [facility] [address] [place], [place] [address]    Culture   Final    NO GROWTH 3 DAYS Performed at [facility], [place] [address] [place], [place] [address]    Report Status PENDING  Incomplete  Culture, blood (routine x 2)     Status: None (Preliminary result)   Collection Time: 11/07/18  7:23 PM   Specimen: BLOOD LEFT HAND  Result Value Ref Range Status   Specimen Description   Final    BLOOD LEFT HAND Performed at [facility], [place] [address] [place], [place] [address]    Special Requests   Final    BOTTLES DRAWN AEROBIC ONLY Blood Culture adequate volume Performed at [facility] [address] [place], [place] [address]    Culture   Final    NO GROWTH 3 DAYS Performed at [facility], [place] [address] [place], [place] [address]    Report Status PENDING  Incomplete  SARS Coronavirus 2 (CEPHEID- Performed in [facility] hospital lab), Hosp Order     Status: None   Collection Time: 11/07/18  7:28 PM   Specimen: Nasopharyngeal Swab  Result Value Ref Range Status   SARS Coronavirus 2 NEGATIVE NEGATIVE Final    Comment: (NOTE) If result is NEGATIVE SARS-CoV-2 target nucleic acids are NOT DETECTED. The SARS-CoV-2 RNA is generally detectable in upper and lower  respiratory specimens  during the acute phase of infection. The lowest  concentration of SARS-CoV-2 viral copies this assay can detect is 250  copies / mL. A negative result does not preclude SARS-CoV-2 infection  and should not be used as the sole basis for  treatment or other  patient management decisions.  A negative result may occur with  improper specimen collection / handling, submission of specimen other  than nasopharyngeal swab, presence of viral mutation(s) within the  areas targeted by this assay, and inadequate number of viral copies  (<250 copies / mL). A negative result must be combined with clinical  observations, patient history, and epidemiological information. If result is POSITIVE SARS-CoV-2 target nucleic acids are DETECTED. The SARS-CoV-2 RNA is generally detectable in upper and lower  respiratory specimens dur ing the acute phase of infection.  Positive  results are indicative of active infection with SARS-CoV-2.  Clinical  correlation with patient history and other diagnostic information is  necessary to determine patient infection status.  Positive results do  not rule out bacterial infection or co-infection with other viruses. If result is PRESUMPTIVE POSTIVE SARS-CoV-2 nucleic acids MAY BE PRESENT.   A presumptive positive result was obtained on the submitted specimen  and confirmed on repeat testing.  While 2019 novel coronavirus  (SARS-CoV-2) nucleic acids may be present in the submitted sample  additional confirmatory testing may be necessary for epidemiological  and / or clinical management purposes  to differentiate between  SARS-CoV-2 and other Sarbecovirus currently known to infect humans.  If clinically indicated additional testing with an alternate test  methodology [phone]) is advised. The SARS-CoV-2 RNA is generally  detectable in upper and lower respiratory sp ecimens during the acute  phase of infection. The expected result is Negative. Fact Sheet for Patients:  StrictlyIdeas.no Fact Sheet for Healthcare Providers: BankingDealers.co.za This test is not yet approved or cleared by the [place] FDA and has been authorized for detection and/or  diagnosis of SARS-CoV-2 by FDA under an Emergency Use Authorization (EUA).  This EUA will remain in effect (meaning this test can be used) for the duration of the COVID-19 declaration under Section 564(b)(1) of the Act, 21 U.S.C. section 360bbb-3(b)(1), unless the authorization is terminated or revoked sooner. Performed at [facility], [place] [address], [place], [place] [address]      Time coordinating discharge:  I have spent 35 minutes face to face with the patient and on the ward discussing the patients care, assessment, plan and disposition with other care givers. >50% of the time was devoted counseling the patient about the risks and benefits of treatment/Discharge disposition and coordinating care.   SIGNED:   Damita Lack, MD  [facility] 11/10/2018, 11:04 AM   If 7PM-7AM, please contact night-coverage [email or web address]

## 2018-11-10 NOTE — TOC Progression Note (Signed)
Transition of Care [facility]) - Progression Note    Patient Details  Name: [name] MRN: [id number] Date of Birth: 1935-02-16  Transition of Care [facility]) CM/SW Contact  [address] [place], [place] Phone Number: 11/10/2018, 3:05 PM  Clinical Narrative:     Return call from [facility]. Staff there stated that they would not be able to take patient back today because there was no one in the building able to review the [place]. This social worker was contacted by the administrator there, DeDe Drema [place] [phone] who confirmed that she was not in the building today and would not be able to review the FL2 until tomorrow.  Fl2 and speech evaluation requested faxed to [phone] for her review in the morning.  MD and RN to be informed that discharge will need to be delayed.  Expected Discharge Plan: Memory Care Barriers to Discharge: No Barriers Identified  Expected Discharge Plan and Services Expected Discharge Plan: Memory Care In-house Referral: Clinical Social Work Discharge Planning Services: CM Consult   Living arrangements for the past 2 months: Assisted Living Facility(Memory Care) Expected Discharge Date: 11/10/18                                     Social Determinants of Health (SDOH) Interventions    Readmission Risk Interventions No flowsheet data found.

## 2018-11-10 NOTE — TOC Progression Note (Signed)
Transition of Care [facility]) - Progression Note    Patient Details  Name: [name] MRN: [id number] Date of Birth: November 19, 1934  Transition of Care [facility]) CM/SW Contact  Jameka Ivie, [place], [place] Phone Number: 11/10/2018, 2:10 PM  Clinical Narrative:   Patient to return back to [facility] today. FL2 completed, discharge summary and speech evaluation obtained. This Education officer, museum has made multiple unsuccessful calls to [place] to coordinate patient's return.  Social Work to continue efforts to make contact with a saff member at [employer] to coordinate patient's discharge home.    Expected Discharge Plan: Memory Care Barriers to Discharge: No Barriers Identified  Expected Discharge Plan and Services Expected Discharge Plan: Memory Care In-house Referral: Clinical Social Work Discharge Planning Services: CM Consult   Living arrangements for the past 2 months: Assisted Living Facility(Memory Care) Expected Discharge Date: 11/10/18                                     Social Determinants of Health (SDOH) Interventions    Readmission Risk Interventions No flowsheet data found.

## 2018-11-10 NOTE — NC FL2 (Signed)
[place] LEVEL OF CARE SCREENING TOOL     IDENTIFICATION  Patient Name: [name] Birthdate: 26-Dec-1934 Sex: male Admission Date (Current Location): 11/07/2018  [facility] and [place] Number:  Herbalist and Address:  [facility],  [place] [address], [place]      Provider Number: [id number]  Attending Physician Name and Address:  Damita Lack, MD  Relative Name and Phone Number:  Daugher Nikkie-[phone]    Current Level of Care: Hospital Recommended Level of Care: Memory Care Prior Approval Number:    Date Approved/Denied:   PASRR Number:    Discharge Plan: Other (Comment)(Memory Care)    Current Diagnoses: Patient Active Problem List   Diagnosis Date Noted  . Aspiration pneumonitis ([place]) 11/08/2018  . Aspiration pneumonia ([place]) 11/08/2018  . Dementia ([place])   . History of alcohol use disorder   . History of seizures   . Nausea and vomiting   . Rib fracture 03/16/2015  . Anemia 03/16/2015  . Benign essential HTN 12/24/2014  . 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[place]) 12/16/2014    Orientation RESPIRATION BLADDER Height & Weight     (Memory Impaired)  Normal Continent Weight: 132 lb 4.4 oz (60 kg) Height:  (Unknown)  BEHAVIORAL SYMPTOMS/MOOD NEUROLOGICAL BOWEL NUTRITION STATUS      Continent Diet  AMBULATORY STATUS COMMUNICATION OF NEEDS Skin   Supervision Verbally Normal                       Personal Care Assistance Level of Assistance  Bathing, Feeding, Dressing Bathing Assistance: Limited assistance Feeding assistance: Independent Dressing Assistance: Independent     Functional Limitations Info  Sight, Hearing, Speech Sight Info: Adequate Hearing Info: Adequate Speech Info: Adequate    SPECIAL CARE FACTORS FREQUENCY  Speech therapy                    Contractures Contractures Info: Not present    Additional Factors Info  Code Status, Allergies, Psychotropic Code Status Info:  Fullcode Allergies Info: Allergies: No Known Allergies Psychotropic Info: Depakote Sprinkle 125mg          Current Medications (11/10/2018):  This is the current hospital active medication list Current Facility-Administered Medications  Medication Dose Route Frequency Provider Last Rate Last Dose  . acetaminophen (TYLENOL) tablet 650 mg  650 mg Oral Q6H PRN Lenore Cordia, MD       Or  . acetaminophen (TYLENOL) suppository 650 mg  650 mg Rectal Q6H PRN Lenore Cordia, MD      . alum & mag hydroxide-simeth (MAALOX/MYLANTA) 200-200-20 MG/5ML suspension 30 mL  30 mL Oral Q4H PRN Amin, Ankit Chirag, MD      . divalproex (DEPAKOTE SPRINKLE) capsule 125 mg  125 mg Oral BID Lenore Cordia, MD   125 mg at 11/10/18 1039  . enoxaparin (LOVENOX) injection 40 mg  40 mg Subcutaneous Daily Zada Finders R, MD   40 mg at 11/10/18 1039  . guaiFENesin-dextromethorphan (ROBITUSSIN DM) 100-10 MG/5ML syrup 5 mL  5 mL Oral Q4H PRN Amin, Ankit Chirag, MD      . hydrALAZINE (APRESOLINE) injection 10 mg  10 mg Intravenous Q4H PRN Amin, Ankit Chirag, MD      . hydrocortisone (ANUSOL-HC) 2.5 % rectal cream 1 application  1 application Topical QID PRN Amin, Ankit Chirag, MD      . hydrocortisone cream 1 % 1 application  1 application Topical TID PRN Amin, Ankit  Chirag, MD      . latanoprost (XALATAN) 0.005 % ophthalmic solution 1 drop  1 drop Both Eyes QHS Lenore Cordia, MD   1 drop at 11/09/18 2140  . lip balm (CARMEX) ointment 1 application  1 application Topical PRN Amin, Ankit Chirag, MD      . loratadine (CLARITIN) tablet 10 mg  10 mg Oral Daily PRN Amin, Ankit Chirag, MD      . Muscle Rub CREA 1 application  1 application Topical PRN Amin, Ankit Chirag, MD      . ondansetron (ZOFRAN) tablet 4 mg  4 mg Oral Q6H PRN Lenore Cordia, MD       Or  . ondansetron (ZOFRAN) injection 4 mg  4 mg Intravenous Q6H PRN Patel, Vishal R, MD      . phenol (CHLORASEPTIC) mouth spray 1 spray  1 spray Mouth/Throat PRN Amin,  Ankit Chirag, MD      . polyethylene glycol (MIRALAX / GLYCOLAX) packet 17 g  17 g Oral Daily PRN Amin, Ankit Chirag, MD      . polyvinyl alcohol (LIQUIFILM TEARS) 1.4 % ophthalmic solution 1 drop  1 drop Both Eyes PRN Amin, Ankit Chirag, MD      . senna-docusate (Senokot-S) tablet 2 tablet  2 tablet Oral QHS PRN Amin, Ankit Chirag, MD      . sodium chloride (OCEAN) 0.65 % nasal spray 1 spray  1 spray Each Nare PRN Amin, Ankit Chirag, MD      . vitamin B-12 (CYANOCOBALAMIN) tablet 1,000 mcg  1,000 mcg Oral Daily Amin, Ankit Chirag, MD   1,000 mcg at 11/10/18 1039     Discharge Medications: Please see discharge summary for a list of discharge medications.  Relevant Imaging Results:  Relevant Lab Results:   Additional Information [id number]  [place], [place], [place]

## 2018-11-10 NOTE — NC FL2 (Signed)
[place] LEVEL OF CARE SCREENING TOOL     IDENTIFICATION  Patient Name: [name] Birthdate: [birth year] Sex: male Admission Date (Current Location): 11/07/2018  [facility] and [place] Number:  Herbalist and Address:  [facility],  [place] [address] [place]      Provider Number: [id number]  Attending Physician Name and Address:  Damita Lack, MD  Relative Name and Phone Number:  Daugher [phone]    Current Level of Care: Hospital Recommended Level of Care: Memory Care Prior Approval Number:    Date Approved/Denied:   PASRR Number:    Discharge Plan: Other (Comment)(Memory Care)    Current Diagnoses: Patient Active Problem List   Diagnosis Date Noted  . Aspiration pneumonitis ([place]) 11/08/2018  . Aspiration pneumonia ([place]) 11/08/2018  . Dementia ([place])   . History of alcohol use disorder   . History of seizures   . Nausea and vomiting   . Rib fracture 03/16/2015  . Anemia 03/16/2015  . Benign essential HTN 12/24/2014  . 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[place]) 12/16/2014    Orientation RESPIRATION BLADDER Height & Weight     (Memory Impaired)  Normal Continent Weight: 132 lb 4.4 oz (60 kg) Height:  (Unknown)  BEHAVIORAL SYMPTOMS/MOOD NEUROLOGICAL BOWEL NUTRITION STATUS      Continent Diet  AMBULATORY STATUS COMMUNICATION OF NEEDS Skin   Supervision Verbally Normal                       Personal Care Assistance Level of Assistance  Bathing, Feeding, Dressing Bathing Assistance: Limited assistance Feeding assistance: Independent Dressing Assistance: Independent     Functional Limitations Info  Sight, Hearing, Speech Sight Info: Adequate Hearing Info: Adequate Speech Info: Adequate    SPECIAL CARE FACTORS FREQUENCY  Speech therapy                    Contractures Contractures Info: Not present    Additional Factors Info  Code Status, Allergies, Psychotropic Code Status Info:  Fullcode Allergies Info: Allergies: No Known Allergies Psychotropic Info: Depakote Sprinkle 125mg          Current Medications (11/10/2018):  This is the current hospital active medication list Current Facility-Administered Medications  Medication Dose Route Frequency Provider Last Rate Last Dose  . acetaminophen (TYLENOL) tablet 650 mg  650 mg Oral Q6H PRN Lenore Cordia, MD       Or  . acetaminophen (TYLENOL) suppository 650 mg  650 mg Rectal Q6H PRN Lenore Cordia, MD      . alum & mag hydroxide-simeth (MAALOX/MYLANTA) 200-200-20 MG/5ML suspension 30 mL  30 mL Oral Q4H PRN Amin, Ankit Chirag, MD      . divalproex (DEPAKOTE SPRINKLE) capsule 125 mg  125 mg Oral BID Lenore Cordia, MD   125 mg at 11/10/18 1039  . enoxaparin (LOVENOX) injection 40 mg  40 mg Subcutaneous Daily Zada Finders R, MD   40 mg at 11/10/18 1039  . guaiFENesin-dextromethorphan (ROBITUSSIN DM) 100-10 MG/5ML syrup 5 mL  5 mL Oral Q4H PRN Amin, Ankit Chirag, MD      . hydrALAZINE (APRESOLINE) injection 10 mg  10 mg Intravenous Q4H PRN Amin, Ankit Chirag, MD      . hydrocortisone (ANUSOL-HC) 2.5 % rectal cream 1 application  1 application Topical QID PRN Amin, Ankit Chirag, MD      . hydrocortisone cream 1 % 1 application  1 application Topical TID PRN Amin, Ankit  Chirag, MD      . latanoprost (XALATAN) 0.005 % ophthalmic solution 1 drop  1 drop Both Eyes QHS Lenore Cordia, MD   1 drop at 11/09/18 2140  . lip balm (CARMEX) ointment 1 application  1 application Topical PRN Amin, Ankit Chirag, MD      . loratadine (CLARITIN) tablet 10 mg  10 mg Oral Daily PRN Amin, Ankit Chirag, MD      . Muscle Rub CREA 1 application  1 application Topical PRN Amin, Ankit Chirag, MD      . ondansetron (ZOFRAN) tablet 4 mg  4 mg Oral Q6H PRN Lenore Cordia, MD       Or  . ondansetron (ZOFRAN) injection 4 mg  4 mg Intravenous Q6H PRN Patel, Vishal R, MD      . phenol (CHLORASEPTIC) mouth spray 1 spray  1 spray Mouth/Throat PRN Amin,  Ankit Chirag, MD      . polyethylene glycol (MIRALAX / GLYCOLAX) packet 17 g  17 g Oral Daily PRN Amin, Ankit Chirag, MD      . polyvinyl alcohol (LIQUIFILM TEARS) 1.4 % ophthalmic solution 1 drop  1 drop Both Eyes PRN Amin, Ankit Chirag, MD      . senna-docusate (Senokot-S) tablet 2 tablet  2 tablet Oral QHS PRN Amin, Ankit Chirag, MD      . sodium chloride (OCEAN) 0.65 % nasal spray 1 spray  1 spray Each Nare PRN Amin, Ankit Chirag, MD      . vitamin B-12 (CYANOCOBALAMIN) tablet 1,000 mcg  1,000 mcg Oral Daily Amin, Ankit Chirag, MD   1,000 mcg at 11/10/18 1039     Discharge Medications: Please see discharge summary for a list of discharge medications.  Relevant Imaging Results:  Relevant Lab Results:   Additional Information [id number]  Kristin Lamagna, [place], [place]

## 2018-11-11 LAB — COMPREHENSIVE METABOLIC PANEL
ALT: 36 U/L (ref 0–44)
AST: 58 U/L — ABNORMAL HIGH (ref 15–41)
Albumin: 3.6 g/dL (ref 3.5–5.0)
Alkaline Phosphatase: 43 U/L (ref 38–126)
Anion gap: 11 (ref 5–15)
BUN: 12 mg/dL (ref 8–23)
CO2: 24 mmol/L (ref 22–32)
Calcium: 8.9 mg/dL (ref 8.9–10.3)
Chloride: 100 mmol/L (ref 98–111)
Creatinine, Ser: 1 mg/dL (ref 0.61–1.24)
GFR calc Af Amer: >60 mL/min (ref >60)
GFR calc non Af Amer: >60 mL/min (ref >60)
Glucose, Bld: 81 mg/dL (ref 70–99)
Potassium: 3.8 mmol/L (ref 3.5–5.1)
Sodium: 135 mmol/L (ref 135–145)
Total Bilirubin: 1.3 mg/dL — ABNORMAL HIGH (ref 0.3–1.2)
Total Protein: 6.9 g/dL (ref 6.5–8.1)

## 2018-11-11 LAB — CBC
HCT: 40.9 % (ref 39.0–52.0)
Hemoglobin: 12.5 g/dL — ABNORMAL LOW (ref 13.0–17.0)
MCH: 29.6 pg (ref 26.0–34.0)
MCHC: 30.6 g/dL (ref 30.0–36.0)
MCV: 96.9 fL (ref 80.0–100.0)
Platelets: 157 10*3/uL (ref 150–400)
RBC: 4.22 MIL/uL (ref 4.22–5.81)
RDW: 11 % — ABNORMAL LOW (ref 11.5–15.5)
WBC: 7.1 10*3/uL (ref 4.0–10.5)
nRBC: 0 % (ref 0.0–0.2)

## 2018-11-11 LAB — MAGNESIUM: Magnesium: 1.6 mg/dL — ABNORMAL LOW (ref 1.7–2.4)

## 2018-11-11 MED ORDER — MAGNESIUM OXIDE 400 (241.3 MG) MG PO TABS
800.0000 mg | ORAL_TABLET | Freq: Once | ORAL | Status: AC
  Administered 2018-11-11: 12:00:00 800 mg via ORAL
  Filled 2018-11-11: qty 2

## 2018-11-11 MED ORDER — MAGNESIUM SULFATE 2 GM/50ML IV SOLN
2.0000 g | Freq: Once | INTRAVENOUS | Status: DC
  Filled 2018-11-11: qty 50

## 2018-11-11 NOTE — Progress Notes (Signed)
Report given to Tammy RN at receiving facility.

## 2018-11-11 NOTE — Progress Notes (Signed)
Patient unable to leave yesterday.  No acute events overnight.  Discharge summary done yesterday. Spoke with the nurse as well regarding the patient.  All questions answered.  No further change in plan.  Call with further questions as needed.  Gerlean Ren MD [facility]

## 2018-11-11 NOTE — NC FL2 (Addendum)
[place] LEVEL OF CARE SCREENING TOOL     IDENTIFICATION  Patient Name: [name] Birthdate: [birth year] Sex: male Admission Date (Current Location): 11/07/2018  [facility] and [place] Number:  Herbalist and Address:  [facility],  [place] [address] [place]      Provider Number: [id number]  Attending Physician Name and Address:  Damita Lack, MD  Relative Name and Phone Number:  Daugher [phone]    Current Level of Care: Hospital Recommended Level of Care: Memory Care Prior Approval Number:    Date Approved/Denied:   PASRR Number:    Discharge Plan: Other (Comment)(Memory Care)    Current Diagnoses: Patient Active Problem List   Diagnosis Date Noted  . Aspiration pneumonitis ([place]) 11/08/2018  . Aspiration pneumonia ([place]) 11/08/2018  . Dementia ([place])   . History of alcohol use disorder   . History of seizures   . Nausea and vomiting   . Rib fracture 03/16/2015  . Anemia 03/16/2015  . Benign essential HTN 12/24/2014  . 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[place]) 12/16/2014    Orientation RESPIRATION BLADDER Height & Weight     Self  Normal Incontinent Weight: 126 lb 12.2 oz (57.5 kg) Height:  (Unknown)  BEHAVIORAL SYMPTOMS/MOOD NEUROLOGICAL BOWEL NUTRITION STATUS      Incontinent (No added table salt with regular liquid)  AMBULATORY STATUS COMMUNICATION OF NEEDS Skin   Supervision Verbally Normal                       Personal Care Assistance Level of Assistance  Bathing, Feeding, Dressing Bathing Assistance: Limited assistance Feeding assistance: Independent Dressing Assistance: Limited assistance     Functional Limitations Info  Sight, Hearing, Speech Sight Info: Adequate Hearing Info: Adequate Speech Info: Adequate    SPECIAL CARE FACTORS FREQUENCY  Speech therapy   1 week                  Contractures Contractures Info: Not present    Additional Factors Info  Code Status,  Allergies, Psychotropic Code Status Info: Fullcode Allergies Info: Allergies: No Known Allergies Psychotropic Info: Depakote Sprinkle 125mg           Discharge Medications: Please see discharge summary for a list of discharge medications.  Medication List    TAKE these medications   acetaminophen 500 MG tablet Commonly known as: TYLENOL Take 500 mg by mouth 2 (two) times a day. Not to exceed 2000 mg in 24 hours.  Call MD if fever is above 101F   alum & mag hydroxide-simeth 322-025-42 MG/5ML suspension Commonly known as: MAALOX/MYLANTA Take 30 mLs by mouth every 6 (six) hours as needed for indigestion or heartburn. Reported on 11/11/2015   cyanocobalamin 1000 MCG tablet Take 1 tablet (1,000 mcg total) by mouth daily.   divalproex 125 MG capsule Commonly known as: DEPAKOTE SPRINKLE Take 125 mg by mouth 2 (two) times daily.   guaifenesin 100 MG/5ML syrup Commonly known as: ROBITUSSIN Take 200 mg by mouth 4 (four) times daily as needed for cough. Reported on 11/11/2015   latanoprost 0.005 % ophthalmic solution Commonly known as: XALATAN Place 1 drop into both eyes at bedtime. (if patient will tolerate)   Loperamide A-D 2 MG tablet Generic drug: loperamide Take 2 mg by mouth 4 (four) times daily as needed for diarrhea or loose stools. Reported on 11/11/2015   magnesium hydroxide 400 MG/5ML suspension Commonly known as: MILK OF MAGNESIA Take 30 mLs by mouth  at bedtime as needed for mild constipation. Reported on 11/11/2015   neomycin-bacitracin-polymyxin ointment Commonly known as: NEOSPORIN Apply 1 application topically See admin instructions. Reported on 11/11/2015   ondansetron 4 MG tablet Commonly known as: ZOFRAN Take 4 mg by mouth every 8 (eight) hours as needed for nausea or vomiting. Reported on 11/11/2015     Relevant Imaging Results:  Relevant Lab Results:   Additional Information [id number]  Lia Hopping, LCSW

## 2018-11-11 NOTE — TOC Transition Note (Addendum)
Transition of Care [facility]) - CM/SW Discharge Note   Patient Details  Name: Eliasar Hlavaty MRN: [id number] Date of Birth: Aug 08, 1934  Transition of Care [facility]) CM/SW Contact:  Lia Hopping, [place] Phone Number: 11/11/2018, 11:37 AM   Clinical Narrative:    CSW spoke with the facility coordinator Tammy. The FL2 and D/C under review.  CSW notified patient daughter about the discharge.   Signed FL2 sent.  PTAR arranged for transport.    Final next level of care: Memory Care Barriers to Discharge: No Barriers Identified   Patient Goals and CMS Choice        Discharge Placement              Patient chooses bed at: [facility]) Patient to be transferred to facility by: [place] Name of family member notified: Daughter Patient and family notified of of transfer: 11/11/18  Discharge Plan and Services In-house Referral: Clinical Social Work Discharge Planning Services: CM Consult                                 Social Determinants of Health ([place]) Interventions     Readmission Risk Interventions No flowsheet data found.

## 2018-11-11 NOTE — Progress Notes (Signed)
Attempted to call report to receiving facility. No answer, left a message.

## 2018-11-12 LAB — CULTURE, BLOOD (ROUTINE X 2)
Culture: NO GROWTH
Culture: NO GROWTH
Special Requests: ADEQUATE

## 2019-07-08 ENCOUNTER — Other Ambulatory Visit: Payer: Self-pay

## 2019-07-08 ENCOUNTER — Emergency Department (HOSPITAL_COMMUNITY)
Admission: EM | Admit: 2019-07-08 | Discharge: 2019-07-08 | Disposition: A | Discharge status: Home / Self Care | Payer: Medicare Other | Attending: Emergency Medicine | Admitting: Emergency Medicine

## 2019-07-08 ENCOUNTER — Emergency Department (HOSPITAL_COMMUNITY): Payer: Medicare Other

## 2019-07-08 ENCOUNTER — Encounter (HOSPITAL_COMMUNITY): Payer: Self-pay | Admitting: Emergency Medicine

## 2019-07-08 DIAGNOSIS — R0602 Shortness of breath: Secondary | ICD-10-CM | POA: Diagnosis not present

## 2019-07-08 DIAGNOSIS — R0982 Postnasal drip: Secondary | ICD-10-CM

## 2019-07-08 DIAGNOSIS — F0390 Unspecified dementia without behavioral disturbance: Secondary | ICD-10-CM | POA: Diagnosis not present

## 2019-07-08 DIAGNOSIS — Z87891 Personal history of nicotine dependence: Secondary | ICD-10-CM | POA: Diagnosis not present

## 2019-07-08 DIAGNOSIS — Z79899 Other long term (current) drug therapy: Secondary | ICD-10-CM | POA: Insufficient documentation

## 2019-07-08 DIAGNOSIS — R0981 Nasal congestion: Secondary | ICD-10-CM | POA: Insufficient documentation

## 2019-07-08 DIAGNOSIS — I10 Essential (primary) hypertension: Secondary | ICD-10-CM | POA: Diagnosis not present

## 2019-07-08 LAB — CBC WITH DIFFERENTIAL/PLATELET
Abs Immature Granulocytes: 0.03 10*3/uL (ref 0.00–0.07)
Basophils Absolute: 0.1 10*3/uL (ref 0.0–0.1)
Basophils Relative: 1 %
Eosinophils Absolute: 0 10*3/uL (ref 0.0–0.5)
Eosinophils Relative: 1 %
HCT: 40 % (ref 39.0–52.0)
Hemoglobin: 12.2 g/dL — ABNORMAL LOW (ref 13.0–17.0)
Immature Granulocytes: 0 %
Lymphocytes Relative: 27 %
Lymphs Abs: 2 10*3/uL (ref 0.7–4.0)
MCH: 30.9 pg (ref 26.0–34.0)
MCHC: 30.5 g/dL (ref 30.0–36.0)
MCV: 101.3 fL — ABNORMAL HIGH (ref 80.0–100.0)
Monocytes Absolute: 0.7 10*3/uL (ref 0.1–1.0)
Monocytes Relative: 9 %
Neutro Abs: 4.6 10*3/uL (ref 1.7–7.7)
Neutrophils Relative %: 62 %
Platelets: 224 10*3/uL (ref 150–400)
RBC: 3.95 MIL/uL — ABNORMAL LOW (ref 4.22–5.81)
RDW: 11.7 % (ref 11.5–15.5)
WBC: 7.4 10*3/uL (ref 4.0–10.5)
nRBC: 0 % (ref 0.0–0.2)

## 2019-07-08 LAB — BASIC METABOLIC PANEL
Anion gap: 13 (ref 5–15)
BUN: 23 mg/dL (ref 8–23)
CO2: 27 mmol/L (ref 22–32)
Calcium: 9.1 mg/dL (ref 8.9–10.3)
Chloride: 104 mmol/L (ref 98–111)
Creatinine, Ser: 1.18 mg/dL (ref 0.61–1.24)
GFR calc Af Amer: >60 mL/min (ref >60)
GFR calc non Af Amer: 57 mL/min — ABNORMAL LOW (ref >60)
Glucose, Bld: 79 mg/dL (ref 70–99)
Potassium: 4.3 mmol/L (ref 3.5–5.1)
Sodium: 144 mmol/L (ref 135–145)

## 2019-07-08 MED ORDER — SODIUM CHLORIDE 3 % IN NEBU
4.0000 mL | INHALATION_SOLUTION | Freq: Once | RESPIRATORY_TRACT | Status: DC
  Filled 2019-07-08: qty 4

## 2019-07-08 MED ORDER — ALBUTEROL SULFATE HFA 108 (90 BASE) MCG/ACT IN AERS
2.0000 | INHALATION_SPRAY | Freq: Once | RESPIRATORY_TRACT | Status: AC
  Administered 2019-07-08: 2 via RESPIRATORY_TRACT
  Filled 2019-07-08: qty 6.7

## 2019-07-08 NOTE — ED Notes (Signed)
[facility] called for report.

## 2019-07-08 NOTE — Progress Notes (Signed)
PT refused neb tx- RN aware.

## 2019-07-08 NOTE — Discharge Instructions (Addendum)
Please return for worsening difficulty breathing or fever.  I think that you need to have something suctioned from the back of your throat.  If you will not let us do that then I am not going to force it on you.  Please return if you would like to have this done or if they are unable to do it for you at your facility.  The other possibility is that you have choked on something.  Seems less likely as you are having no difficulty breathing and have a normal chest x-ray.  Sometimes this does take some time to develop on chest x-ray though so if you have worsening please return.

## 2019-07-08 NOTE — ED Provider Notes (Signed)
[facility] DEPT Provider Note   CSN: [id number] Arrival date & time: 07/08/19  0831     History Chief Complaint  Patient presents with  . Shortness of Breath    [name] is a 85 y.o. male.  85 yo M with a cc of sob.  Reported by nursing facility.  Noted this morning.  Patient demented and cannot provide any history.  Level V caveat.   The history is provided by the patient.  Shortness of Breath Severity:  Severe Onset quality:  Gradual Duration:  1 day Timing:  Constant Progression:  Worsening Chronicity:  New Relieved by:  Nothing Worsened by:  Nothing Ineffective treatments:  None tried Associated symptoms: no abdominal pain, no chest pain, no fever, no headaches, no rash and no vomiting        Past Medical History:  Diagnosis Date  . Alcohol dependence ([place])    Associated with thrombocytopenia, anemia, elevated LFTs, hypomagnesemia  . Anemia   . Arthritis   . Colon cancer ([place])   . Dementia ([place])    Vascular and alcohol related.  Marland Kitchen HTN (hypertension)   . Seizures ([place])    Related to withdrawal, no seizure in years    Patient Active Problem List   Diagnosis Date Noted  . Aspiration pneumonitis ([place]) 11/08/2018  . Aspiration pneumonia ([place]) 11/08/2018  . Dementia ([place])   . History of alcohol use disorder   . History of seizures   . Nausea and vomiting   . Rib fracture 03/16/2015  . Anemia 03/16/2015  . Benign essential HTN 12/24/2014  . 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[place]) 12/16/2014    Past Surgical History:  Procedure Laterality Date  . COLONOSCOPY    . HEMICOLECTOMY  12/2014  . ORIF FEMORAL NECK FRACTURE W/ [facility]         Family History  Problem Relation Age of Onset  . Colon cancer Neg Hx     Social History   Tobacco Use  . Smoking status: Former Smoker    Packs/day: 1.00    Quit date: 12/14/1975    Years since quitting: 43.5  . Smokeless tobacco: Never Used  Substance Use Topics  . Alcohol use: Yes   Alcohol/week: 0.0 standard drinks    Comment: USED TO DRINK BEER REGULARLY NOW DRINKS ABOUT 2 BEERS PER MONTH  . Drug use: No    Home Medications Prior to Admission medications   Medication Sig Start Date End Date Taking? Authorizing Provider  acetaminophen (TYLENOL) 500 MG tablet Take 500 mg by mouth 2 (two) times a day. Not to exceed 2000 mg in 24 hours.  Call MD if fever is above 101F  Additional 500 mg as needed every 4 hours x 24 hrs   Yes [provider]  alum & mag hydroxide-simeth (MAALOX/MYLANTA) 200-200-20 MG/5ML suspension Take 30 mLs by mouth every 6 (six) hours as needed for indigestion or heartburn. Reported on 11/11/2015   Yes [provider]  divalproex (DEPAKOTE SPRINKLE) 125 MG capsule Take 125 mg by mouth 2 (two) times daily.  09/23/18  Yes [provider]  guaifenesin (ROBITUSSIN) 100 MG/5ML syrup Take 200 mg by mouth 4 (four) times daily as needed for cough. Reported on 11/11/2015   Yes [provider]  latanoprost (XALATAN) 0.005 % ophthalmic solution Place 1 drop into both eyes at bedtime as needed (as Tolerated). (if patient will tolerate) 10/08/18  Yes [provider]  loperamide (LOPERAMIDE A-D) 2 MG tablet Take 2 mg  by mouth 4 (four) times daily as needed for diarrhea or loose stools. Reported on 11/11/2015   Yes [provider]  magnesium hydroxide (MILK OF MAGNESIA) 400 MG/5ML suspension Take 30 mLs by mouth at bedtime as needed for mild constipation. Reported on 11/11/2015   Yes [provider]  neomycin-bacitracin-polymyxin (NEOSPORIN) ointment Apply 1 application topically daily as needed (Minor skin tears or abrasions clean normal sale apply neosporin).    Yes [provider]  ondansetron (ZOFRAN) 4 MG tablet Take 4 mg by mouth every 8 (eight) hours as needed for nausea or vomiting. Reported on 11/11/2015   Yes [provider]  vitamin B-12 (CYANOCOBALAMIN) 1000 MCG tablet Take 1,000 mcg by  mouth daily.   Yes [provider]    Allergies    Patient has no known allergies.  Review of Systems   Review of Systems  Unable to perform ROS: Patient nonverbal  Constitutional: Negative for chills and fever.  HENT: Negative for congestion and facial swelling.   Eyes: Negative for discharge and visual disturbance.  Respiratory: Positive for shortness of breath.   Cardiovascular: Negative for chest pain and palpitations.  Gastrointestinal: Negative for abdominal pain, diarrhea and vomiting.  Musculoskeletal: Negative for arthralgias and myalgias.  Skin: Negative for color change and rash.  Neurological: Negative for tremors, syncope and headaches.  Psychiatric/Behavioral: Negative for confusion and dysphoric mood.    Physical Exam Updated Vital Signs BP (!) 172/80   Pulse 64   Temp 98.4 F (36.9 C) (Oral)   Resp 16   Ht 5\' 4"  (1.626 m)   Wt 54 kg   SpO2 98%   BMI 20.43 kg/m   Physical Exam Vitals and nursing note reviewed.  Constitutional:      Appearance: He is well-developed.  HENT:     Head: Normocephalic and atraumatic.  Eyes:     Pupils: Pupils are equal, round, and reactive to light.  Neck:     Vascular: No JVD.  Cardiovascular:     Rate and Rhythm: Normal rate and regular rhythm.     Heart sounds: No murmur. No friction rub. No gallop.   Pulmonary:     Effort: No respiratory distress.     Breath sounds: Rhonchi (diffuse) present. No wheezing.  Abdominal:     General: There is no distension.     Tenderness: There is no guarding or rebound.  Musculoskeletal:        General: Normal range of motion.     Cervical back: Normal range of motion and neck supple.  Skin:    Coloration: Skin is not pale.     Findings: No rash.  Neurological:     Mental Status: He is alert.     ED Results / Procedures / Treatments   Labs (all labs ordered are listed, but only abnormal results are displayed) Labs Reviewed  CBC WITH DIFFERENTIAL/PLATELET -  Abnormal; Notable for the following components:      Result Value   RBC 3.95 (*)    Hemoglobin 12.2 (*)    MCV 101.3 (*)    All other components within normal limits  BASIC METABOLIC PANEL - Abnormal; Notable for the following components:   GFR calc non Af Amer 57 (*)    All other components within normal limits    EKG EKG Interpretation  Date/Time:  Tuesday July 08 2019 08:49:58 EST Ventricular Rate:  67 PR Interval:    QRS Duration: 78 QT Interval:  412 QTC Calculation: 435  R Axis:   12 Text Interpretation: Sinus rhythm No significant change since last tracing Confirmed by Deno Etienne [phone]) on 07/08/2019 8:56:13 AM   Radiology DG Chest Port 1 View  Result Date: 07/08/2019 CLINICAL DATA:  Shortness of breath EXAM: PORTABLE CHEST 1 VIEW COMPARISON:  11/07/2018 FINDINGS: The heart size and mediastinal contours are within normal limits. No focal airspace consolidation, pleural effusion, or pneumothorax. The visualized skeletal structures are unremarkable. IMPRESSION: No active disease. Electronically Signed   By: Davina Poke D.O.   On: 07/08/2019 10:39    Procedures Procedures (including critical care time)  Medications Ordered in ED Medications  sodium chloride HYPERTONIC 3 % nebulizer solution 4 mL (has no administration in time range)  albuterol (VENTOLIN HFA) 108 (90 Base) MCG/ACT inhaler 2 puff (2 puffs Inhalation Given 07/08/19 0908)    ED Course  I have reviewed the triage vital signs and the nursing notes.  Pertinent labs & imaging results that were available during my care of the patient were reviewed by me and considered in my medical decision making (see chart for details).    MDM Rules/Calculators/A&P                      85 yo M with sob.  Diffusely rhonchorous, has a history of aspiration pneumonia.  Patient may have aspirated this morning though no report of this from the nursing facility.  This also could be upper airway transmitted noises.  Will  attempt to deep suction and see if that improves his symptoms.  Chest x-ray lab work albuterol reassess.  Chest x-ray viewed by me without focal infiltrate.  Lab work is otherwise unremarkable.  Attempts to suction were refused by the patient.  I think most likely the patient has fluid in his posterior oropharynx.  He keeps making increased efforts to swallow and has some clearing of his throat.  However if he is refusing to let us do this then we will not force it upon him.  I also discussed the option with him that this could be an aspiration pneumonia though seems less likely.  I did offered to have him watched in the hospital for this which he has declined.  On my repeat assessment the patient did seem cognizant enough to be able to make his own decisions.  We will have him follow-up with his family doctor.  11:12 AM:  I have discussed the diagnosis/risks/treatment options with the patient and believe the pt to be eligible for discharge home to follow-up with PCP. We also discussed returning to the ED immediately if new or worsening sx occur. We discussed the sx which are most concerning (e.g., sudden worsening pain, fever, inability to tolerate by mouth) that necessitate immediate return. Medications administered to the patient during their visit and any new prescriptions provided to the patient are listed below.  Medications given during this visit Medications  sodium chloride HYPERTONIC 3 % nebulizer solution 4 mL (has no administration in time range)  albuterol (VENTOLIN HFA) 108 (90 Base) MCG/ACT inhaler 2 puff (2 puffs Inhalation Given 07/08/19 0908)     The patient appears reasonably screen and/or stabilized for discharge and I doubt any other medical condition or other [facility] requiring further screening, evaluation, or treatment in the ED at this time prior to discharge.   Final Clinical Impression(s) / ED Diagnoses Final diagnoses:  Post-nasal drip    Rx / DC Orders ED Discharge Orders     None  Deno Etienne, DO 07/08/19 1112

## 2019-07-08 NOTE — ED Triage Notes (Signed)
Patient arriving by EMS from [facility]. Staff reports patient having SOB, wheezing and congestion. Patient had 1st COVID vaccine on 19th.   BP 150/88 HR 80 T 99.7  Patient denies any pain at this time.

## 2019-07-08 NOTE — Progress Notes (Signed)
RT attempted on 3 different occassions to oral suction PT (too alert for NTS)- PT refused with each attempt. PT currently on RA with Sp02 of 99%, RR 14, HR 76. PT states he is "breathing fine," "I am not sick." NT+3 also attempted to get PT consent- still "no." RT informed MD of situation. RT and NT+3 pulled PT up in bed. RT asked PT to cough, PT states "I do not need to cough." However, PT did clear his throat.

## 2019-07-08 NOTE — ED Notes (Signed)
Phlebotomy called to collect labs  

## 2019-07-15 ENCOUNTER — Encounter (HOSPITAL_COMMUNITY): Payer: Self-pay | Admitting: Radiology

## 2019-07-15 ENCOUNTER — Other Ambulatory Visit: Payer: Self-pay

## 2019-07-15 ENCOUNTER — Emergency Department (HOSPITAL_COMMUNITY): Payer: Medicare Other

## 2019-07-15 ENCOUNTER — Emergency Department (HOSPITAL_COMMUNITY)
Admission: EM | Admit: 2019-07-15 | Discharge: 2019-07-15 | Disposition: A | Discharge status: Skilled Nursing Facility | Payer: Medicare Other | Attending: Emergency Medicine | Admitting: Emergency Medicine

## 2019-07-15 DIAGNOSIS — R070 Pain in throat: Secondary | ICD-10-CM | POA: Diagnosis not present

## 2019-07-15 DIAGNOSIS — Z79899 Other long term (current) drug therapy: Secondary | ICD-10-CM | POA: Diagnosis not present

## 2019-07-15 DIAGNOSIS — R6889 Other general symptoms and signs: Secondary | ICD-10-CM

## 2019-07-15 DIAGNOSIS — F0390 Unspecified dementia without behavioral disturbance: Secondary | ICD-10-CM | POA: Insufficient documentation

## 2019-07-15 DIAGNOSIS — R0981 Nasal congestion: Secondary | ICD-10-CM | POA: Diagnosis present

## 2019-07-15 DIAGNOSIS — R05 Cough: Secondary | ICD-10-CM | POA: Diagnosis not present

## 2019-07-15 DIAGNOSIS — Z87891 Personal history of nicotine dependence: Secondary | ICD-10-CM | POA: Insufficient documentation

## 2019-07-15 DIAGNOSIS — I10 Essential (primary) hypertension: Secondary | ICD-10-CM | POA: Diagnosis not present

## 2019-07-15 LAB — COMPREHENSIVE METABOLIC PANEL WITH GFR
ALT: 17 U/L (ref 0–44)
AST: 26 U/L (ref 15–41)
Albumin: 3.2 g/dL — ABNORMAL LOW (ref 3.5–5.0)
Alkaline Phosphatase: 49 U/L (ref 38–126)
Anion gap: 12 (ref 5–15)
BUN: 20 mg/dL (ref 8–23)
CO2: 27 mmol/L (ref 22–32)
Calcium: 9 mg/dL (ref 8.9–10.3)
Chloride: 104 mmol/L (ref 98–111)
Creatinine, Ser: 1.03 mg/dL (ref 0.61–1.24)
GFR calc Af Amer: >60 mL/min
GFR calc non Af Amer: >60 mL/min
Glucose, Bld: 84 mg/dL (ref 70–99)
Potassium: 4.8 mmol/L (ref 3.5–5.1)
Sodium: 143 mmol/L (ref 135–145)
Total Bilirubin: 1.1 mg/dL (ref 0.3–1.2)
Total Protein: 7.8 g/dL (ref 6.5–8.1)

## 2019-07-15 LAB — CBC WITH DIFFERENTIAL/PLATELET
Abs Immature Granulocytes: 0.03 K/uL (ref 0.00–0.07)
Basophils Absolute: 0.1 K/uL (ref 0.0–0.1)
Basophils Relative: 1 %
Eosinophils Absolute: 0.1 K/uL (ref 0.0–0.5)
Eosinophils Relative: 1 %
HCT: 42.3 % (ref 39.0–52.0)
Hemoglobin: 13.3 g/dL (ref 13.0–17.0)
Immature Granulocytes: 0 %
Lymphocytes Relative: 24 %
Lymphs Abs: 2.1 K/uL (ref 0.7–4.0)
MCH: 31.2 pg (ref 26.0–34.0)
MCHC: 31.4 g/dL (ref 30.0–36.0)
MCV: 99.3 fL (ref 80.0–100.0)
Monocytes Absolute: 1 K/uL (ref 0.1–1.0)
Monocytes Relative: 11 %
Neutro Abs: 5.7 K/uL (ref 1.7–7.7)
Neutrophils Relative %: 63 %
Platelets: 144 K/uL — ABNORMAL LOW (ref 150–400)
RBC: 4.26 MIL/uL (ref 4.22–5.81)
RDW: 12.1 % (ref 11.5–15.5)
WBC: 8.9 K/uL (ref 4.0–10.5)
nRBC: 0 % (ref 0.0–0.2)

## 2019-07-15 NOTE — ED Provider Notes (Signed)
[facility] DEPT Provider Note   CSN: [id number] Arrival date & time: 07/15/19  0920     History Chief Complaint  Patient presents with  . Nasal Congestion    [name] is a 85 y.o. male.  Patient sent in from [facility] for congestion.  And noted that there is been some breathing abnormalities for about 2 weeks.  Patient has a known history of dementia baseline is alert and oriented x2 which is where he appears to be today.  EMS did not notice any difficulty breathing.  EMS did report patient has a productive cough.  There was no any history of fevers.  EMS noted lung sounds were clear.  I noted that when I first saw him he had upper respiratory sounds.        Past Medical History:  Diagnosis Date  . Alcohol dependence ([place])    Associated with thrombocytopenia, anemia, elevated LFTs, hypomagnesemia  . Anemia   . Arthritis   . Colon cancer ([place])   . Dementia ([place])    Vascular and alcohol related.  Marland Kitchen HTN (hypertension)   . Seizures ([place])    Related to withdrawal, no seizure in years    Patient Active Problem List   Diagnosis Date Noted  . Aspiration pneumonitis ([place]) 11/08/2018  . Aspiration pneumonia ([place]) 11/08/2018  . Dementia ([place])   . History of alcohol use disorder   . History of seizures   . Nausea and vomiting   . Rib fracture 03/16/2015  . Anemia 03/16/2015  . Benign essential HTN 12/24/2014  . 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[place]) 12/16/2014    Past Surgical History:  Procedure Laterality Date  . COLONOSCOPY    . HEMICOLECTOMY  12/2014  . ORIF FEMORAL NECK FRACTURE W/ [facility]         Family History  Problem Relation Age of Onset  . Colon cancer Neg Hx     Social History   Tobacco Use  . Smoking status: Former Smoker    Packs/day: 1.00    Quit date: 12/14/1975    Years since quitting: 43.6  . Smokeless tobacco: Never Used  Substance Use Topics  . Alcohol use: Yes    Alcohol/week: 0.0 standard drinks    Comment:  USED TO DRINK BEER REGULARLY NOW DRINKS ABOUT 2 BEERS PER MONTH  . Drug use: No    Home Medications Prior to Admission medications   Medication Sig Start Date End Date Taking? Authorizing Provider  acetaminophen (TYLENOL) 500 MG tablet Take 500 mg by mouth 2 (two) times a day. Not to exceed 2000 mg in 24 hours.  Call MD if fever is above 101F  Additional 500 mg as needed every 4 hours x 24 hrs   Yes [provider]  alum & mag hydroxide-simeth (MAALOX/MYLANTA) 200-200-20 MG/5ML suspension Take 30 mLs by mouth every 6 (six) hours as needed for indigestion or heartburn. Reported on 11/11/2015   Yes [provider]  divalproex (DEPAKOTE SPRINKLE) 125 MG capsule Take 125 mg by mouth 2 (two) times daily.  09/23/18  Yes [provider]  guaifenesin (ROBITUSSIN) 100 MG/5ML syrup Take 200 mg by mouth 4 (four) times daily as needed for cough. Reported on 11/11/2015   Yes [provider]  latanoprost (XALATAN) 0.005 % ophthalmic solution Place 1 drop into both eyes at bedtime as needed (as Tolerated). (if patient will tolerate) 10/08/18  Yes [provider]  loperamide (LOPERAMIDE A-D) 2 MG tablet Take 2 mg by  mouth 4 (four) times daily as needed for diarrhea or loose stools. Reported on 11/11/2015   Yes [provider]  magnesium hydroxide (MILK OF MAGNESIA) 400 MG/5ML suspension Take 30 mLs by mouth at bedtime as needed for mild constipation. Reported on 11/11/2015   Yes [provider]  neomycin-bacitracin-polymyxin (NEOSPORIN) ointment Apply 1 application topically daily as needed (Minor skin tears or abrasions clean normal sale apply neosporin).    Yes [provider]  ondansetron (ZOFRAN) 4 MG tablet Take 4 mg by mouth every 8 (eight) hours as needed for nausea or vomiting. Reported on 11/11/2015   Yes [provider]  vitamin B-12 (CYANOCOBALAMIN) 1000 MCG tablet Take 1,000 mcg by mouth daily.   Yes [provider]     Allergies    Patient has no known allergies.  Review of Systems   Review of Systems  Unable to perform ROS: Dementia    Physical Exam Updated Vital Signs BP (!) 150/83 (BP Location: Right Arm)   Pulse 78   Temp 98 F (36.7 C) (Oral)   Resp 20   SpO2 98%   Physical Exam Vitals and nursing note reviewed.  Constitutional:      Appearance: Normal appearance. He is well-developed.  HENT:     Head: Normocephalic and atraumatic.     Mouth/Throat:     Mouth: Mucous membranes are dry.     Comments: Tongue with some coating but after hydration was moist.  Pharynx area clear. Eyes:     Extraocular Movements: Extraocular movements intact.     Conjunctiva/sclera: Conjunctivae normal.     Pupils: Pupils are equal, round, and reactive to light.  Cardiovascular:     Rate and Rhythm: Normal rate and regular rhythm.     Heart sounds: No murmur.  Pulmonary:     Effort: Pulmonary effort is normal. No respiratory distress.     Breath sounds: Normal breath sounds. No wheezing, rhonchi or rales.  Abdominal:     Palpations: Abdomen is soft.     Tenderness: There is no abdominal tenderness.  Musculoskeletal:        General: No swelling. Normal range of motion.     Cervical back: Normal range of motion and neck supple.  Skin:    General: Skin is warm and dry.  Neurological:     General: No focal deficit present.     Mental Status: He is alert. Mental status is at baseline.     ED Results / Procedures / Treatments   Labs (all labs ordered are listed, but only abnormal results are displayed) Labs Reviewed  CBC WITH DIFFERENTIAL/PLATELET - Abnormal; Notable for the following components:      Result Value   Platelets 144 (*)    All other components within normal limits  COMPREHENSIVE METABOLIC PANEL - Abnormal; Notable for the following components:   Albumin 3.2 (*)    All other components within normal limits  BRAIN NATRIURETIC PEPTIDE    EKG None  Radiology CT Chest Wo  Contrast  Result Date: 07/15/2019 CLINICAL DATA:  Chronic dyspnea. EXAM: CT CHEST WITHOUT CONTRAST TECHNIQUE: Multidetector CT imaging of the chest was performed following the standard protocol without IV contrast. COMPARISON:  September 09, 2014. FINDINGS: Cardiovascular: No significant vascular findings. Normal heart size. No pericardial effusion. Mediastinum/Nodes: No enlarged mediastinal or axillary lymph nodes. Thyroid gland, trachea, and esophagus demonstrate no significant findings. Lungs/Pleura: No pneumothorax or pleural effusion is noted. Left lung is clear. Mild right posterior basilar subsegmental  atelectasis is noted. Upper Abdomen: Cholelithiasis is noted. Musculoskeletal: No chest wall mass or suspicious bone lesions identified. IMPRESSION: 1. Mild right posterior basilar subsegmental atelectasis. 2. Cholelithiasis. Electronically Signed   By: Marijo Conception M.D.   On: 07/15/2019 13:38   DG Chest Port 1 View  Result Date: 07/15/2019 CLINICAL DATA:  Increased difficulty breathing, cough and congestion for 2 weeks, history dementia, hypertension, colon cancer, former smoker EXAM: PORTABLE CHEST 1 VIEW COMPARISON:  Portable exam 1017 hours compared to 07/08/2019 FINDINGS: Normal heart size, mediastinal contours, and pulmonary vascularity. Subsegmental atelectasis at RIGHT base unchanged. Remaining lungs clear. No pleural effusion or pneumothorax. Bones demineralized. IMPRESSION: Chronic RIGHT basilar atelectasis without acute infiltrate. Electronically Signed   By: Lavonia Dana M.D.   On: 07/15/2019 11:01    Procedures Procedures (including critical care time)  Medications Ordered in ED Medications - No data to display  ED Course  I have reviewed the triage vital signs and the nursing notes.  Pertinent labs & imaging results that were available during my care of the patient were reviewed by me and considered in my medical decision making (see chart for details).    MDM  Rules/Calculators/A&P                      Labs chest x-ray and CT chest without contrast without any acute findings.  Feel that the sounds have something to do with upper respiratory congestion.  Which improved with some hydration.  Patient stable to return back to [facility].    Final Clinical Impression(s) / ED Diagnoses Final diagnoses:  Congestion of throat    Rx / DC Orders ED Discharge Orders    None       Fredia Sorrow, MD 07/15/19 [phone]

## 2019-07-15 NOTE — ED Triage Notes (Signed)
Pt is coming in by EMS from [facility] with complaints of congestion. Per EMS staff states that pt has been breathing abnormally for approx. 2 weeks. Pt has a hx of dementia and is AO x2 at baseline. Per EMS pt denies pain and difficulty breathing. EMS reports that pt has a productive cough. Staff at facility states pt is afebrile. EMS reports clear lung sounds.

## 2019-07-15 NOTE — Discharge Instructions (Addendum)
Work-up for the breathing difficulty and the raspy sounds without any significant findings.  Chest x-ray negative for any pulmonary edema or pneumonia.  CT scan was done to clarify that and did not show any significant abnormalities.  Feel that the congestion is mostly in the back of his throat area.  Not down in his lungs.  Oxygen saturations have been fine.  Patient appears to be hydrated better now and he has less of that noise making.  Oxygen saturations as stated were fine.  Patient stable for return back to [facility] note

## 2019-07-15 NOTE — ED Notes (Signed)
Main lab has been called for RN

## 2019-07-15 NOTE — ED Notes (Signed)
Contacted lab about BNP add on

## 2019-07-15 NOTE — ED Notes (Signed)
2 failed attempts to collect labs 

## 2019-07-15 NOTE — ED Notes (Signed)
[facility] called  

## 2019-07-28 ENCOUNTER — Encounter (HOSPITAL_COMMUNITY): Payer: Self-pay | Admitting: Emergency Medicine

## 2019-07-28 ENCOUNTER — Emergency Department (HOSPITAL_COMMUNITY)
Admission: EM | Admit: 2019-07-28 | Discharge: 2019-07-29 | Disposition: A | Discharge status: Home / Self Care | Payer: Medicare Other | Source: Home / Self Care | Attending: Emergency Medicine | Admitting: Emergency Medicine

## 2019-07-28 DIAGNOSIS — Z79899 Other long term (current) drug therapy: Secondary | ICD-10-CM | POA: Insufficient documentation

## 2019-07-28 DIAGNOSIS — R569 Unspecified convulsions: Secondary | ICD-10-CM | POA: Diagnosis not present

## 2019-07-28 DIAGNOSIS — F0390 Unspecified dementia without behavioral disturbance: Secondary | ICD-10-CM | POA: Insufficient documentation

## 2019-07-28 DIAGNOSIS — R531 Weakness: Secondary | ICD-10-CM | POA: Insufficient documentation

## 2019-07-28 DIAGNOSIS — U071 COVID-19: Secondary | ICD-10-CM | POA: Diagnosis not present

## 2019-07-28 DIAGNOSIS — Z87891 Personal history of nicotine dependence: Secondary | ICD-10-CM | POA: Insufficient documentation

## 2019-07-28 DIAGNOSIS — I10 Essential (primary) hypertension: Secondary | ICD-10-CM | POA: Insufficient documentation

## 2019-07-28 DIAGNOSIS — Z85038 Personal history of other malignant neoplasm of large intestine: Secondary | ICD-10-CM | POA: Insufficient documentation

## 2019-07-28 LAB — BASIC METABOLIC PANEL
Anion gap: 14 (ref 5–15)
BUN: 18 mg/dL (ref 8–23)
CO2: 27 mmol/L (ref 22–32)
Calcium: 9.3 mg/dL (ref 8.9–10.3)
Chloride: 96 mmol/L — ABNORMAL LOW (ref 98–111)
Creatinine, Ser: 1.07 mg/dL (ref 0.61–1.24)
GFR calc Af Amer: >60 mL/min (ref >60)
GFR calc non Af Amer: >60 mL/min (ref >60)
Glucose, Bld: 95 mg/dL (ref 70–99)
Potassium: 4.4 mmol/L (ref 3.5–5.1)
Sodium: 137 mmol/L (ref 135–145)

## 2019-07-28 LAB — CBC
HCT: 45.4 % (ref 39.0–52.0)
Hemoglobin: 14 g/dL (ref 13.0–17.0)
MCH: 30.3 pg (ref 26.0–34.0)
MCHC: 30.8 g/dL (ref 30.0–36.0)
MCV: 98.3 fL (ref 80.0–100.0)
Platelets: 248 10*3/uL (ref 150–400)
RBC: 4.62 MIL/uL (ref 4.22–5.81)
RDW: 12.1 % (ref 11.5–15.5)
WBC: 9 10*3/uL (ref 4.0–10.5)
nRBC: 0 % (ref 0.0–0.2)

## 2019-07-28 MED ORDER — SODIUM CHLORIDE 0.9% FLUSH: 3.0000 mL | Freq: Once | INTRAVENOUS | Status: DC

## 2019-07-28 NOTE — ED Triage Notes (Signed)
EMS stated, he was here 13 days ago and they said at the facility he was weak , lethargic, and had a different breathing sound, sounds hoarseness.  Pt. Still has his EKG electrodes on.

## 2019-07-29 ENCOUNTER — Inpatient Hospital Stay (HOSPITAL_COMMUNITY)
Admission: EM | Admit: 2019-07-29 | Discharge: 2019-08-05 | DRG: 177 | Disposition: A | Discharge status: Hospice | Payer: Medicare Other | Attending: Internal Medicine | Admitting: Internal Medicine

## 2019-07-29 ENCOUNTER — Inpatient Hospital Stay (HOSPITAL_COMMUNITY): Payer: Medicare Other

## 2019-07-29 ENCOUNTER — Emergency Department (HOSPITAL_COMMUNITY): Payer: Medicare Other

## 2019-07-29 ENCOUNTER — Other Ambulatory Visit: Payer: Self-pay

## 2019-07-29 DIAGNOSIS — I469 Cardiac arrest, cause unspecified: Secondary | ICD-10-CM | POA: Diagnosis not present

## 2019-07-29 DIAGNOSIS — J1282 Pneumonia due to coronavirus disease 2019: Secondary | ICD-10-CM | POA: Diagnosis present

## 2019-07-29 DIAGNOSIS — R001 Bradycardia, unspecified: Secondary | ICD-10-CM | POA: Diagnosis present

## 2019-07-29 DIAGNOSIS — E872 Acidosis: Secondary | ICD-10-CM | POA: Diagnosis present

## 2019-07-29 DIAGNOSIS — J690 Pneumonitis due to inhalation of food and vomit: Secondary | ICD-10-CM | POA: Diagnosis present

## 2019-07-29 DIAGNOSIS — Z85038 Personal history of other malignant neoplasm of large intestine: Secondary | ICD-10-CM | POA: Diagnosis not present

## 2019-07-29 DIAGNOSIS — U071 COVID-19: Principal | ICD-10-CM | POA: Diagnosis present

## 2019-07-29 DIAGNOSIS — R0681 Apnea, not elsewhere classified: Secondary | ICD-10-CM | POA: Diagnosis present

## 2019-07-29 DIAGNOSIS — Z87891 Personal history of nicotine dependence: Secondary | ICD-10-CM | POA: Diagnosis not present

## 2019-07-29 DIAGNOSIS — K5900 Constipation, unspecified: Secondary | ICD-10-CM

## 2019-07-29 DIAGNOSIS — F1027 Alcohol dependence with alcohol-induced persisting dementia: Secondary | ICD-10-CM | POA: Diagnosis present

## 2019-07-29 DIAGNOSIS — F0150 Vascular dementia without behavioral disturbance: Secondary | ICD-10-CM | POA: Diagnosis present

## 2019-07-29 DIAGNOSIS — J9601 Acute respiratory failure with hypoxia: Secondary | ICD-10-CM | POA: Diagnosis present

## 2019-07-29 DIAGNOSIS — I10 Essential (primary) hypertension: Secondary | ICD-10-CM | POA: Diagnosis present

## 2019-07-29 DIAGNOSIS — R0602 Shortness of breath: Secondary | ICD-10-CM

## 2019-07-29 DIAGNOSIS — J9600 Acute respiratory failure, unspecified whether with hypoxia or hypercapnia: Secondary | ICD-10-CM | POA: Diagnosis not present

## 2019-07-29 DIAGNOSIS — R569 Unspecified convulsions: Secondary | ICD-10-CM

## 2019-07-29 DIAGNOSIS — Z79899 Other long term (current) drug therapy: Secondary | ICD-10-CM | POA: Diagnosis not present

## 2019-07-29 DIAGNOSIS — G40901 Epilepsy, unspecified, not intractable, with status epilepticus: Secondary | ICD-10-CM | POA: Diagnosis present

## 2019-07-29 DIAGNOSIS — Z66 Do not resuscitate: Secondary | ICD-10-CM | POA: Diagnosis present

## 2019-07-29 DIAGNOSIS — Z515 Encounter for palliative care: Secondary | ICD-10-CM | POA: Diagnosis not present

## 2019-07-29 DIAGNOSIS — R54 Age-related physical debility: Secondary | ICD-10-CM | POA: Diagnosis present

## 2019-07-29 LAB — COMPREHENSIVE METABOLIC PANEL
ALT: 26 U/L (ref 0–44)
AST: 44 U/L — ABNORMAL HIGH (ref 15–41)
Albumin: 2.8 g/dL — ABNORMAL LOW (ref 3.5–5.0)
Alkaline Phosphatase: 52 U/L (ref 38–126)
Anion gap: 21 — ABNORMAL HIGH (ref 5–15)
BUN: 19 mg/dL (ref 8–23)
CO2: 19 mmol/L — ABNORMAL LOW (ref 22–32)
Calcium: 9 mg/dL (ref 8.9–10.3)
Chloride: 99 mmol/L (ref 98–111)
Creatinine, Ser: 1.21 mg/dL (ref 0.61–1.24)
GFR calc Af Amer: >60 mL/min (ref >60)
GFR calc non Af Amer: 55 mL/min — ABNORMAL LOW (ref >60)
Glucose, Bld: 129 mg/dL — ABNORMAL HIGH (ref 70–99)
Potassium: 4.3 mmol/L (ref 3.5–5.1)
Sodium: 139 mmol/L (ref 135–145)
Total Bilirubin: 1.8 mg/dL — ABNORMAL HIGH (ref 0.3–1.2)
Total Protein: 7.5 g/dL (ref 6.5–8.1)

## 2019-07-29 LAB — URINALYSIS, ROUTINE W REFLEX MICROSCOPIC
Bilirubin Urine: NEGATIVE
Bilirubin Urine: NEGATIVE
Glucose, UA: NEGATIVE mg/dL
Glucose, UA: NEGATIVE mg/dL
Hgb urine dipstick: NEGATIVE
Ketones, ur: 20 mg/dL — AB
Ketones, ur: 5 mg/dL — AB
Leukocytes,Ua: NEGATIVE
Leukocytes,Ua: NEGATIVE
Nitrite: NEGATIVE
Nitrite: NEGATIVE
Protein, ur: 30 mg/dL — AB
Protein, ur: NEGATIVE mg/dL
Specific Gravity, Urine: 1.025 (ref 1.005–1.030)
Specific Gravity, Urine: 1.039 — ABNORMAL HIGH (ref 1.005–1.030)
pH: 5 (ref 5.0–8.0)
pH: 5 (ref 5.0–8.0)

## 2019-07-29 LAB — CBC WITH DIFFERENTIAL/PLATELET
Abs Immature Granulocytes: 0.29 10*3/uL — ABNORMAL HIGH (ref 0.00–0.07)
Basophils Absolute: 0.1 10*3/uL (ref 0.0–0.1)
Basophils Relative: 0 %
Eosinophils Absolute: 0 10*3/uL (ref 0.0–0.5)
Eosinophils Relative: 0 %
HCT: 44.8 % (ref 39.0–52.0)
Hemoglobin: 13.6 g/dL (ref 13.0–17.0)
Immature Granulocytes: 3 %
Lymphocytes Relative: 28 %
Lymphs Abs: 3.2 10*3/uL (ref 0.7–4.0)
MCH: 30.6 pg (ref 26.0–34.0)
MCHC: 30.4 g/dL (ref 30.0–36.0)
MCV: 100.9 fL — ABNORMAL HIGH (ref 80.0–100.0)
Monocytes Absolute: 0.9 10*3/uL (ref 0.1–1.0)
Monocytes Relative: 8 %
Neutro Abs: 7.2 10*3/uL (ref 1.7–7.7)
Neutrophils Relative %: 61 %
Platelets: 292 10*3/uL (ref 150–400)
RBC: 4.44 MIL/uL (ref 4.22–5.81)
RDW: 12.2 % (ref 11.5–15.5)
WBC: 11.7 10*3/uL — ABNORMAL HIGH (ref 4.0–10.5)
nRBC: 0.2 % (ref 0.0–0.2)

## 2019-07-29 LAB — CBG MONITORING, ED: Glucose-Capillary: 95 mg/dL (ref 70–99)

## 2019-07-29 LAB — POCT I-STAT EG7
Bicarbonate: 26.9 mmol/L (ref 20.0–28.0)
Calcium, Ion: 1.03 mmol/L — ABNORMAL LOW (ref 1.15–1.40)
HCT: 44 % (ref 39.0–52.0)
Hemoglobin: 15 g/dL (ref 13.0–17.0)
O2 Saturation: 100 %
Potassium: 5.1 mmol/L (ref 3.5–5.1)
Sodium: 138 mmol/L (ref 135–145)
TCO2: 28 mmol/L (ref 22–32)
pCO2, Ven: 50.2 mmHg (ref 44.0–60.0)
pH, Ven: 7.337 (ref 7.250–7.430)
pO2, Ven: 185 mmHg — ABNORMAL HIGH (ref 32.0–45.0)

## 2019-07-29 LAB — RESPIRATORY PANEL BY RT PCR (FLU A&B, COVID)
Influenza A by PCR: NEGATIVE
Influenza B by PCR: NEGATIVE
SARS Coronavirus 2 by RT PCR: POSITIVE — AB

## 2019-07-29 LAB — POCT I-STAT 7, (LYTES, BLD GAS, ICA,H+H)
Acid-Base Excess: 4 mmol/L — ABNORMAL HIGH (ref 0.0–2.0)
Bicarbonate: 30.2 mmol/L — ABNORMAL HIGH (ref 20.0–28.0)
Calcium, Ion: 1.17 mmol/L (ref 1.15–1.40)
HCT: 37 % — ABNORMAL LOW (ref 39.0–52.0)
Hemoglobin: 12.6 g/dL — ABNORMAL LOW (ref 13.0–17.0)
O2 Saturation: 100 %
Potassium: 4 mmol/L (ref 3.5–5.1)
Sodium: 140 mmol/L (ref 135–145)
TCO2: 32 mmol/L (ref 22–32)
pCO2 arterial: 48.2 mmHg — ABNORMAL HIGH (ref 32.0–48.0)
pH, Arterial: 7.404 (ref 7.350–7.450)
pO2, Arterial: 431 mmHg — ABNORMAL HIGH (ref 83.0–108.0)

## 2019-07-29 LAB — TROPONIN I (HIGH SENSITIVITY): Troponin I (High Sensitivity): 51 ng/L — ABNORMAL HIGH (ref <18)

## 2019-07-29 LAB — AMMONIA: Ammonia: 29 umol/L (ref 9–35)

## 2019-07-29 LAB — LACTIC ACID, PLASMA: Lactic Acid, Venous: 4.1 mmol/L (ref 0.5–1.9)

## 2019-07-29 LAB — ETHANOL: Alcohol, Ethyl (B): <10 mg/dL (ref <10)

## 2019-07-29 MED ORDER — LORAZEPAM 2 MG/ML IJ SOLN
2.0000 mg | Freq: Once | INTRAMUSCULAR | Status: AC
  Administered 2019-07-29: 2 mg via INTRAVENOUS
  Filled 2019-07-29: qty 1

## 2019-07-29 MED ORDER — ACETAMINOPHEN 650 MG RE SUPP: 650.0000 mg | RECTAL | Status: DC | PRN

## 2019-07-29 MED ORDER — LEVETIRACETAM IN NACL 1000 MG/100ML IV SOLN
1000.0000 mg | Freq: Once | INTRAVENOUS | Status: AC
  Administered 2019-07-29: 1000 mg via INTRAVENOUS
  Filled 2019-07-29: qty 100

## 2019-07-29 MED ORDER — IOHEXOL 350 MG/ML SOLN
75.0000 mL | Freq: Once | INTRAVENOUS | Status: AC | PRN
  Administered 2019-07-29: 75 mL via INTRAVENOUS

## 2019-07-29 MED ORDER — IPRATROPIUM BROMIDE HFA 17 MCG/ACT IN AERS
2.0000 | INHALATION_SPRAY | Freq: Four times a day (QID) | RESPIRATORY_TRACT | Status: DC
  Administered 2019-07-30 – 2019-08-03 (×17): 2 via RESPIRATORY_TRACT
  Filled 2019-07-29: qty 12.9

## 2019-07-29 MED ORDER — SODIUM CHLORIDE 0.9 % IV SOLN
100.0000 mg | INTRAVENOUS | Status: AC
  Administered 2019-07-29 (×2): 100 mg via INTRAVENOUS
  Filled 2019-07-29 (×2): qty 20

## 2019-07-29 MED ORDER — DIVALPROEX SODIUM 125 MG PO CSDR: 125.0000 mg | DELAYED_RELEASE_CAPSULE | Freq: Two times a day (BID) | ORAL | Status: DC

## 2019-07-29 MED ORDER — GUAIFENESIN 100 MG/5ML PO SOLN
200.0000 mg | Freq: Four times a day (QID) | ORAL | Status: DC | PRN
  Filled 2019-07-29: qty 10

## 2019-07-29 MED ORDER — LORAZEPAM 2 MG/ML IJ SOLN: 4.0000 mg | INTRAMUSCULAR | Status: AC

## 2019-07-29 MED ORDER — SODIUM CHLORIDE 0.9 % IV SOLN
75.0000 mL/h | INTRAVENOUS | Status: DC
  Administered 2019-07-29: 75 mL/h via INTRAVENOUS

## 2019-07-29 MED ORDER — SODIUM CHLORIDE 0.9 % IV SOLN
3.0000 g | Freq: Once | INTRAVENOUS | Status: AC
  Administered 2019-07-29: 3 g via INTRAVENOUS
  Filled 2019-07-29: qty 3

## 2019-07-29 MED ORDER — DEXAMETHASONE SODIUM PHOSPHATE 10 MG/ML IJ SOLN
6.0000 mg | INTRAMUSCULAR | Status: DC
  Administered 2019-07-29 – 2019-07-30 (×2): 6 mg via INTRAVENOUS
  Filled 2019-07-29 (×2): qty 1

## 2019-07-29 MED ORDER — SODIUM CHLORIDE 0.9 % IV SOLN: 200.0000 mg | Freq: Once | INTRAVENOUS | Status: DC

## 2019-07-29 MED ORDER — SODIUM CHLORIDE 0.9 % IV SOLN
3.0000 g | Freq: Three times a day (TID) | INTRAVENOUS | Status: DC
  Administered 2019-07-30 – 2019-08-02 (×11): 3 g via INTRAVENOUS
  Filled 2019-07-29 (×3): qty 8
  Filled 2019-07-29: qty 3
  Filled 2019-07-29: qty 8
  Filled 2019-07-29 (×4): qty 3
  Filled 2019-07-29: qty 8
  Filled 2019-07-29: qty 3
  Filled 2019-07-29: qty 8
  Filled 2019-07-29: qty 3
  Filled 2019-07-29 (×3): qty 8

## 2019-07-29 MED ORDER — SODIUM CHLORIDE 0.9 % IV BOLUS
1000.0000 mL | Freq: Once | INTRAVENOUS | Status: AC
  Administered 2019-07-29: 1000 mL via INTRAVENOUS

## 2019-07-29 MED ORDER — LATANOPROST 0.005 % OP SOLN
1.0000 [drp] | Freq: Every evening | OPHTHALMIC | Status: DC | PRN
  Filled 2019-07-29: qty 2.5

## 2019-07-29 MED ORDER — DIVALPROEX SODIUM 125 MG PO CSDR
500.0000 mg | DELAYED_RELEASE_CAPSULE | Freq: Two times a day (BID) | ORAL | Status: DC
  Filled 2019-07-29 (×2): qty 4

## 2019-07-29 MED ORDER — GUAIFENESIN-DM 100-10 MG/5ML PO SYRP: 10.0000 mL | ORAL_SOLUTION | ORAL | Status: DC | PRN

## 2019-07-29 MED ORDER — VITAMIN B-12 1000 MCG PO TABS: 1000.0000 ug | ORAL_TABLET | Freq: Every day | ORAL | Status: DC

## 2019-07-29 MED ORDER — SODIUM CHLORIDE 0.9 % IV SOLN
100.0000 mg | Freq: Every day | INTRAVENOUS | Status: AC
  Administered 2019-07-30 – 2019-08-02 (×4): 100 mg via INTRAVENOUS
  Filled 2019-07-29 (×5): qty 20

## 2019-07-29 MED ORDER — ACETAMINOPHEN 325 MG PO TABS
650.0000 mg | ORAL_TABLET | ORAL | Status: DC | PRN
  Administered 2019-08-04: 650 mg via ORAL
  Filled 2019-07-29: qty 2

## 2019-07-29 MED ORDER — SODIUM CHLORIDE 0.9 % IV SOLN: 100.0000 mg | Freq: Every day | INTRAVENOUS | Status: DC

## 2019-07-29 MED ORDER — LEVETIRACETAM IN NACL 500 MG/100ML IV SOLN
500.0000 mg | Freq: Two times a day (BID) | INTRAVENOUS | Status: DC
  Administered 2019-07-30 – 2019-08-05 (×13): 500 mg via INTRAVENOUS
  Filled 2019-07-29 (×13): qty 100

## 2019-07-29 MED ORDER — ONDANSETRON HCL 4 MG PO TABS: 4.0000 mg | ORAL_TABLET | Freq: Four times a day (QID) | ORAL | Status: DC | PRN

## 2019-07-29 MED ORDER — ONDANSETRON HCL 4 MG/2ML IJ SOLN: 4.0000 mg | Freq: Four times a day (QID) | INTRAMUSCULAR | Status: DC | PRN

## 2019-07-29 MED ORDER — HEPARIN SODIUM (PORCINE) 5000 UNIT/ML IJ SOLN
5000.0000 [IU] | Freq: Two times a day (BID) | INTRAMUSCULAR | Status: DC
  Administered 2019-07-29 – 2019-07-30 (×3): 5000 [IU] via SUBCUTANEOUS
  Filled 2019-07-29 (×3): qty 1

## 2019-07-29 NOTE — Progress Notes (Signed)
NP changed code status to DNR based on admitting Dr. note.

## 2019-07-29 NOTE — ED Provider Notes (Addendum)
[facility] EMERGENCY DEPARTMENT Provider Note   CSN: [id number] Arrival date & time: 07/28/19  1827     History Chief Complaint  Patient presents with  . Weakness  . Fatigue    [name] is a 85 y.o. male.  Patient sent to the emergency department from skilled nursing facility.  Patient was sent to the ER because staff thought he was weaker than usual and also have noticed change in his breathing patterns.  Patient has chronic dementia, is not answering questions appropriately upon arrival to the ER.  This appears to be his baseline.Level V Caveat due to dementia.        Past Medical History:  Diagnosis Date  . Alcohol dependence ([place])    Associated with thrombocytopenia, anemia, elevated LFTs, hypomagnesemia  . Anemia   . Arthritis   . Colon cancer ([place])   . Dementia ([place])    Vascular and alcohol related.  Marland Kitchen HTN (hypertension)   . Seizures ([place])    Related to withdrawal, no seizure in years    Patient Active Problem List   Diagnosis Date Noted  . Aspiration pneumonitis ([place]) 11/08/2018  . Aspiration pneumonia ([place]) 11/08/2018  . Dementia ([place])   . History of alcohol use disorder   . History of seizures   . Nausea and vomiting   . Rib fracture 03/16/2015  . Anemia 03/16/2015  . Benign essential HTN 12/24/2014  . 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[place]) 12/16/2014    Past Surgical History:  Procedure Laterality Date  . COLONOSCOPY    . HEMICOLECTOMY  12/2014  . ORIF FEMORAL NECK FRACTURE W/ [facility]         Family History  Problem Relation Age of Onset  . Colon cancer Neg Hx     Social History   Tobacco Use  . Smoking status: Former Smoker    Packs/day: 1.00    Quit date: 12/14/1975    Years since quitting: 43.6  . Smokeless tobacco: Never Used  Substance Use Topics  . Alcohol use: Yes    Alcohol/week: 0.0 standard drinks    Comment: USED TO DRINK BEER REGULARLY NOW DRINKS ABOUT 2 BEERS PER MONTH  . Drug use: No    Home  Medications Prior to Admission medications   Medication Sig Start Date End Date Taking? Authorizing Provider  acetaminophen (TYLENOL) 500 MG tablet Take 500 mg by mouth 2 (two) times a day. Not to exceed 2000 mg in 24 hours.  Call MD if fever is above 101F  Additional 500 mg as needed every 4 hours x 24 hrs   Yes [provider]  alum & mag hydroxide-simeth (MAALOX/MYLANTA) 200-200-20 MG/5ML suspension Take 30 mLs by mouth every 6 (six) hours as needed for indigestion or heartburn. Reported on 11/11/2015   Yes [provider]  divalproex (DEPAKOTE SPRINKLE) 125 MG capsule Take 125 mg by mouth 2 (two) times daily.  09/23/18  Yes [provider]  guaifenesin (ROBITUSSIN) 100 MG/5ML syrup Take 200 mg by mouth 4 (four) times daily as needed for cough. Reported on 11/11/2015   Yes [provider]  latanoprost (XALATAN) 0.005 % ophthalmic solution Place 1 drop into both eyes at bedtime as needed (as Tolerated). (if patient will tolerate) 10/08/18  Yes [provider]  loperamide (LOPERAMIDE A-D) 2 MG tablet Take 2 mg by mouth 4 (four) times daily as needed for diarrhea or loose stools. Reported on 11/11/2015   Yes [provider]  magnesium hydroxide (MILK OF  MAGNESIA) 400 MG/5ML suspension Take 30 mLs by mouth at bedtime as needed for mild constipation. Reported on 11/11/2015   Yes [provider]  neomycin-bacitracin-polymyxin (NEOSPORIN) ointment Apply 1 application topically daily as needed (Minor skin tears or abrasions clean normal sale apply neosporin).    Yes [provider]  ondansetron (ZOFRAN) 4 MG tablet Take 4 mg by mouth every 8 (eight) hours as needed for nausea or vomiting. Reported on 11/11/2015   Yes [provider]  vitamin B-12 (CYANOCOBALAMIN) 1000 MCG tablet Take 1,000 mcg by mouth daily.   Yes [provider]    Allergies    Patient has no known allergies.  Review of Systems   Review of Systems   Unable to perform ROS: Dementia    Physical Exam Updated Vital Signs BP 128/83   Pulse 84   Temp 98.6 F (37 C) (Oral)   Resp 12   SpO2 97%   Physical Exam Constitutional:      Comments: thin  HENT:     Head: Normocephalic and atraumatic.  Cardiovascular:     Rate and Rhythm: Normal rate and regular rhythm.     Heart sounds: Normal heart sounds.  Pulmonary:     Effort: No accessory muscle usage or respiratory distress.     Breath sounds: Transmitted upper airway sounds present. Rhonchi (scattered) present.  Musculoskeletal:        General: No swelling. Normal range of motion.     Cervical back: Normal range of motion and neck supple.  Skin:    General: Skin is warm and dry.  Neurological:     Mental Status: He is alert. Mental status is at baseline. He is disoriented.     Cranial Nerves: Cranial nerves are intact.     Sensory: Sensation is intact.     Motor: Motor function is intact.     ED Results / Procedures / Treatments   Labs (all labs ordered are listed, but only abnormal results are displayed) Labs Reviewed  BASIC METABOLIC PANEL - Abnormal; Notable for the following components:      Result Value   Chloride 96 (*)    All other components within normal limits  URINALYSIS, ROUTINE W REFLEX MICROSCOPIC - Abnormal; Notable for the following components:   Color, Urine AMBER (*)    APPearance HAZY (*)    Hgb urine dipstick SMALL (*)    Ketones, ur 20 (*)    Protein, ur 30 (*)    Bacteria, UA RARE (*)    All other components within normal limits  CBC  CBG MONITORING, ED    EKG EKG Interpretation  Date/Time:  Monday July 28 2019 18:55:08 EDT Ventricular Rate:  94 PR Interval:  140 QRS Duration: 70 QT Interval:  332 QTC Calculation: 415 R Axis:   -8 Text Interpretation: Normal sinus rhythm Anteroseptal infarct , age undetermined Abnormal ECG No significant change since last tracing Confirmed by Orpah Greek [id number]) on 07/29/2019 1:39:05  AM   Radiology DG Chest Port 1 View  Result Date: 07/29/2019 CLINICAL DATA:  85 year old male with shortness of breath. EXAM: PORTABLE CHEST 1 VIEW COMPARISON:  Chest radiograph dated 07/15/2019. FINDINGS: No focal consolidation, pleural effusion or pneumothorax. Right lung base atelectasis/scarring. The cardiac silhouette is within normal limits. Osteopenia with degenerative changes of the spine. No acute osseous pathology. IMPRESSION: No acute cardiopulmonary process. Electronically Signed   By: Anner Crete M.D.   On: 07/29/2019 02:37    Procedures Procedures (including  critical care time)  Medications Ordered in ED Medications  sodium chloride flush (NS) 0.9 % injection 3 mL (3 mLs Intravenous Not Given 07/29/19 0150)    ED Course  I have reviewed the triage vital signs and the nursing notes.  Pertinent labs & imaging results that were available during my care of the patient were reviewed by me and considered in my medical decision making (see chart for details).    MDM Rules/Calculators/A&P                      Patient presents to the emergency department because of some kind of change in his breathing at the nursing home.  She has a history of dementia, cannot appropriately answer questions.  He did not have any acute complaints upon arrival.  I have noticed that when he is sleeping in the room he is fine but when you make him aware of your presence or wake him up he then starts making some noises with his respirations that sound like upper airway resonance.  Lungs are clear.  Chest x-ray is clear.  Lab work is unremarkable.  Urinalysis does not suggest infection.  I do not see any acute medical condition that would require hospitalization, patient will be returned to nursing home.  Addendum: Asked by nursing staff to reevaluate patient.  He had another episode of noisy breathing and his heart rate went up into the 110s.  Upon recheck the patient is sleeping in the bed.  This is  the fourth time that I have encountered him sleeping with comfortable breathing and then when I woke him up he starts forced expirations that sound like wheezing.  Lungs are still clear.  No change in plan.  Final Clinical Impression(s) / ED Diagnoses Final diagnoses:  Weakness    Rx / DC Orders ED Discharge Orders    None       Dvid Pendry, Gwenyth Allegra, MD 07/29/19 [id number]    Orpah Greek, MD 07/29/19 [phone]

## 2019-07-29 NOTE — Progress Notes (Signed)
EEG complete - results pending 

## 2019-07-29 NOTE — H&P (Signed)
History and Physical    [name] [id number] DOB: 04/02/1935 DOA: 07/29/2019  PCP: Sande Brothers, MD   Patient coming from: NH  I have personally briefly reviewed patient's old medical records in [place]  Chief Complaint: AMS  HPI: [name] is a 85 y.o. male with medical history significant of remote history of alcohol dependence, hypertension, seizure, dementia, presented with unresponsiveness.  The patient was noticed to be less responsive than normal probably since yesterday.  Patient was in the ED yesterday, complaining of short of breath, physical exam last night showed upper airway sound, and rhonchi scattered, lab work and x-ray, EKG unremarkable, and patient was sent back to nursing home.    Today, patient became less responsive, upon assessment they thought that he had stopped breathing and lost pulses and so started CPR.  Upon EMS arrival the patient did have pulses he was significantly bradycardia and apneic.  Narcan was pushed without improvement. En route to ER, there was some improvement of his mental status and then the patient had what appeared to be a tonic-clonic seizure.  2.5 mg of Versed was pushed.    ED Course: Patient very sleepy, lab work showed mild metabolic acidosis, no leukocytosis, chest x-ray clear, EKG no significant arrhythmia.  Review of Systems: Unable to perform, patient is still under altered mental status/sedation  Past Medical History:  Diagnosis Date  . Alcohol dependence ([place])    Associated with thrombocytopenia, anemia, elevated LFTs, hypomagnesemia  . Anemia   . Arthritis   . Colon cancer ([place])   . Dementia ([place])    Vascular and alcohol related.  Marland Kitchen HTN (hypertension)   . Seizures ([place])    Related to withdrawal, no seizure in years    Past Surgical History:  Procedure Laterality Date  . COLONOSCOPY    . HEMICOLECTOMY  12/2014  . ORIF FEMORAL NECK FRACTURE W/ [facility]       reports that he quit smoking about 43 years ago. He  smoked 1.00 pack per day. He has never used smokeless tobacco. He reports current alcohol use. He reports that he does not use drugs.  No Known Allergies  Family History  Problem Relation Age of Onset  . Colon cancer Neg Hx      Prior to Admission medications   Medication Sig Start Date End Date Taking? Authorizing Provider  acetaminophen (TYLENOL) 500 MG tablet Take 500 mg by mouth 2 (two) times a day. Not to exceed 2000 mg in 24 hours.  Call MD if fever is above 101F  Additional 500 mg as needed every 4 hours x 24 hrs    [provider]  alum & mag hydroxide-simeth (MAALOX/MYLANTA) 200-200-20 MG/5ML suspension Take 30 mLs by mouth every 6 (six) hours as needed for indigestion or heartburn. Reported on 11/11/2015    [provider]  divalproex (DEPAKOTE SPRINKLE) 125 MG capsule Take 125 mg by mouth 2 (two) times daily.  09/23/18   [provider]  guaifenesin (ROBITUSSIN) 100 MG/5ML syrup Take 200 mg by mouth 4 (four) times daily as needed for cough. Reported on 11/11/2015    [provider]  latanoprost (XALATAN) 0.005 % ophthalmic solution Place 1 drop into both eyes at bedtime as needed (as Tolerated). (if patient will tolerate) 10/08/18   [provider]  loperamide (LOPERAMIDE A-D) 2 MG tablet Take 2 mg by mouth 4 (four) times daily as needed for diarrhea or loose stools. Reported on 11/11/2015    [provider]  magnesium hydroxide (MILK OF MAGNESIA) 400 MG/5ML suspension Take 30 mLs by mouth at bedtime as needed for mild constipation. Reported on 11/11/2015    [provider]  neomycin-bacitracin-polymyxin (NEOSPORIN) ointment Apply 1 application topically daily as needed (Minor skin tears or abrasions clean normal sale apply neosporin).     [provider]  ondansetron (ZOFRAN) 4 MG tablet Take 4 mg by mouth every 8 (eight) hours as needed for nausea or vomiting. Reported on 11/11/2015    [provider]    vitamin B-12 (CYANOCOBALAMIN) 1000 MCG tablet Take 1,000 mcg by mouth daily.    [provider]    Physical Exam: Vitals:   07/29/19 1430 07/29/19 1515 07/29/19 1530 07/29/19 1700  BP: (!) 178/133 (!) 85/53 (!) 96/57   Pulse:  (!) 109 (!) 107   Resp: (!) 26 (!) 22 17   Temp:      TempSrc:      SpO2:  97% 90%   Weight:    54 kg  Height:    5\' 4"  (1.626 m)    Constitutional: NAD, calm, comfortable Vitals:   07/29/19 1430 07/29/19 1515 07/29/19 1530 07/29/19 1700  BP: (!) 178/133 (!) 85/53 (!) 96/57   Pulse:  (!) 109 (!) 107   Resp: (!) 26 (!) 22 17   Temp:      TempSrc:      SpO2:  97% 90%   Weight:    54 kg  Height:    5\' 4"  (1.626 m)   Eyes: PERRL, lids and conjunctivae normal ENMT: Mucous membranes are moist. Posterior pharynx clear of any exudate or lesions.Normal dentition.  Neck: normal, supple, no masses, no thyromegaly Respiratory: clear to auscultation bilaterally, no wheezing, no crackles. Normal respiratory effort. No accessory muscle use.  Cardiovascular: Regular rate and rhythm, no murmurs / rubs / gallops. No extremity edema. 2+ pedal pulses. No carotid bruits.  Abdomen: no tenderness, no masses palpated. No hepatosplenomegaly. Bowel sounds positive.  Musculoskeletal: no clubbing / cyanosis. No joint deformity upper and lower extremities. Good ROM, no contractures. Normal muscle tone.  Skin: no rashes, lesions, ulcers. No induration Neurologic: Arousable with very loud voice, respond to physical pain, moving upper limbs Psychiatric: Sleepy    Labs on Admission: I have personally reviewed following labs and imaging studies  CBC: Recent Labs  Lab 07/28/19 1901 07/29/19 1400 07/29/19 1415  WBC 9.0 11.7*  --   NEUTROABS  --  7.2  --   HGB 14.0 13.6 15.0  HCT 45.4 44.8 44.0  MCV 98.3 100.9*  --   PLT 248 292  --    Basic Metabolic Panel: Recent Labs  Lab 07/28/19 1901 07/29/19 1400 07/29/19 1415  NA 137 139 138  K 4.4 4.3 5.1  CL 96*  99  --   CO2 27 19*  --   GLUCOSE 95 129*  --   BUN 18 19  --   CREATININE 1.07 1.21  --   CALCIUM 9.3 9.0  --    GFR: Estimated Creatinine Clearance: 35.3 mL/min (by C-G formula based on SCr of 1.21 mg/dL). Liver Function Tests: Recent Labs  Lab 07/29/19 1400  AST 44*  ALT 26  ALKPHOS 52  BILITOT 1.8*  PROT 7.5  ALBUMIN 2.8*   No results for input(s): LIPASE, AMYLASE in the last 168 hours. Recent Labs  Lab 07/29/19 1415  AMMONIA 29   Coagulation Profile: No results for input(s): INR, PROTIME in the last 168 hours. Cardiac Enzymes:  No results for input(s): CKTOTAL, CKMB, CKMBINDEX, TROPONINI in the last 168 hours. BNP (last 3 results) No results for input(s): PROBNP in the last 8760 hours. HbA1C: No results for input(s): HGBA1C in the last 72 hours. CBG: Recent Labs  Lab 07/29/19 1423  GLUCAP 95   Lipid Profile: No results for input(s): CHOL, HDL, LDLCALC, TRIG, CHOLHDL, LDLDIRECT in the last 72 hours. Thyroid Function Tests: No results for input(s): TSH, T4TOTAL, FREET4, T3FREE, THYROIDAB in the last 72 hours. Anemia Panel: No results for input(s): VITAMINB12, FOLATE, FERRITIN, TIBC, IRON, RETICCTPCT in the last 72 hours. Urine analysis:    Component Value Date/Time   COLORURINE AMBER (A) 07/29/2019 0609   APPEARANCEUR HAZY (A) 07/29/2019 0609   LABSPEC 1.025 07/29/2019 0609   PHURINE 5.0 07/29/2019 0609   GLUCOSEU NEGATIVE 07/29/2019 0609   HGBUR SMALL (A) 07/29/2019 0609   BILIRUBINUR NEGATIVE 07/29/2019 0609   KETONESUR 20 (A) 07/29/2019 0609   PROTEINUR 30 (A) 07/29/2019 0609   UROBILINOGEN 1.0 03/20/2011 0407   NITRITE NEGATIVE 07/29/2019 0609   LEUKOCYTESUR NEGATIVE 07/29/2019 0609    Radiological Exams on Admission: EEG  Result Date: 07/29/2019 Lora Havens, MD     07/29/2019  4:15 PM Patient Name: Jhonnie Peno MRN: [id number] Epilepsy Attending: Lora Havens Referring Physician/Provider: Etta Quill, PA Date: 07/29/2019 Duration: 26.07  minutes Patient history: 85 year old male with history of alcohol withdrawal seizures, dementia who presented after seizure-like episode.  EEG to evaluate for seizures. Level of alertness: Lethargic AEDs during EEG study: Ativan, Depakote Technical aspects: This EEG study was done with scalp electrodes positioned according to the 10-20 International system of electrode placement. Electrical activity was acquired at a sampling rate of 500Hz  and reviewed with a high frequency filter of 70Hz  and a low frequency filter of 1Hz . EEG data were recorded continuously and digitally stored. Description: No clear posterior dominant was seen.  EEG showed continuous generalized low amplitude 2 to 3 Hz delta slowing.  Hyperventilation and photic stimulation were not performed.  Of note, EEG was technically difficult secondary to significant myogenic artifact. Abnormality - Continuous slow, generalized IMPRESSION: This technically difficult study is suggestive of moderate to severe diffuse encephalopathy, nonspecific to etiology. No seizures or epileptiform discharges were seen throughout the recording. Lora Havens   CT Angio Head W or Wo Contrast  Result Date: 07/29/2019 CLINICAL DATA:  Encephalopathy. Additional history provided: Unresponsive episode, at renal respirations, CPR, seizure EN route EXAM: CT ANGIOGRAPHY HEAD AND NECK TECHNIQUE: Multidetector CT imaging of the head and neck was performed using the standard protocol during bolus administration of intravenous contrast. Multiplanar CT image reconstructions and MIPs were obtained to evaluate the vascular anatomy. Carotid stenosis measurements (when applicable) are obtained utilizing NASCET criteria, using the distal internal carotid diameter as the denominator. CONTRAST:  42mL OMNIPAQUE IOHEXOL 350 MG/ML SOLN COMPARISON:  Noncontrast head CT 07/29/2019 FINDINGS: CTA NECK FINDINGS The examination is mildly motion degraded. Aortic arch: Common origin of the  innominate and left common carotid arteries. The visualized aortic arch is unremarkable. No innominate or proximal subclavian artery stenosis. Right carotid system: CCA and ICA patent within the neck without stenosis. Left carotid system: CCA and ICA patent within the neck without stenosis. Vertebral arteries: The right vertebral artery is dominant. The vertebral arteries are patent within the neck bilaterally without stenosis. Skeleton: No acute bony abnormality or aggressive osseous lesion identified. Cervical spondylosis without high-grade bony spinal canal narrowing. Other neck: No neck mass or cervical lymphadenopathy. Upper  chest: No consolidation within the imaged lung apices. Review of the MIP images confirms the above findings CTA HEAD FINDINGS Anterior circulation: The intracranial internal carotid arteries are patent without stenosis. The M1 middle cerebral arteries are patent without significant stenosis. No M2 proximal branch occlusion or high-grade proximal stenosis is identified. The anterior cerebral arteries are patent without high-grade proximal stenosis. No intracranial aneurysm is identified. Posterior circulation: The dominant intracranial right vertebral artery is patent without stenosis and supplies the basilar artery. The non dominant intracranial left vertebral artery is markedly diminutive beyond the origin of the left PICA, although faintly patent. The basilar artery is patent without significant stenosis. Predominantly fetal origin of the right posterior cerebral artery which is patent without significant proximal stenosis. The left posterior cerebral artery is patent without significant proximal stenosis. A small left posterior communicating artery is present. Venous sinuses: Within limitations of contrast timing, no convincing thrombus. Anatomic variants: As described Review of the MIP images confirms the above findings IMPRESSION: CTA neck: The bilateral common carotid, internal  carotid and vertebral arteries are patent within the neck without stenosis. The right vertebral artery is dominant. CTA head: 1. No intracranial large vessel occlusion. 2. The intracranial left vertebral artery is markedly diminutive beyond the origin of the left PICA. This is suspected largely on a developmental basis given significant non dominance. A significant V4 left vertebral artery stenosis cannot be definitively excluded. The dominant intracranial right vertebral artery is widely patent and supplies the basilar artery. 3. Otherwise, no evidence of proximal high-grade arterial stenosis. Electronically Signed   By: Kellie Simmering DO   On: 07/29/2019 15:23   CT HEAD WO CONTRAST  Result Date: 07/29/2019 CLINICAL DATA:  Encephalopathy. EXAM: CT HEAD WITHOUT CONTRAST TECHNIQUE: Contiguous axial images were obtained from the base of the skull through the vertex without intravenous contrast. COMPARISON:  Head CT 03/07/2015 FINDINGS: Brain: There is no evidence of acute intracranial hemorrhage, intracranial mass, midline shift or extra-axial fluid collection.No demarcated cortical infarction. Moderate ill-defined hypoattenuation within the cerebral white matter is similar to prior examination 03/07/2015 and nonspecific, but consistent with chronic small vessel ischemic disease. Moderate generalized parenchymal atrophy has progressed. Vascular: No hyperdense vessel.  Atherosclerotic calcifications. Skull: Normal. Negative for fracture or focal lesion. Sinuses/Orbits: Visualized orbits demonstrate no acute abnormality. No significant paranasal sinus disease or mastoid effusion at the imaged levels. Other: Partially visualized support tubes. IMPRESSION: No evidence of acute intracranial abnormality. Moderate chronic small vessel ischemic disease, stable as compared to head CT 03/07/2015. Moderate generalized parenchymal atrophy has progressed. Electronically Signed   By: Kellie Simmering DO   On: 07/29/2019 14:16   CT  Angio Neck W and/or Wo Contrast  Result Date: 07/29/2019 CLINICAL DATA:  Encephalopathy. Additional history provided: Unresponsive episode, at renal respirations, CPR, seizure EN route EXAM: CT ANGIOGRAPHY HEAD AND NECK TECHNIQUE: Multidetector CT imaging of the head and neck was performed using the standard protocol during bolus administration of intravenous contrast. Multiplanar CT image reconstructions and MIPs were obtained to evaluate the vascular anatomy. Carotid stenosis measurements (when applicable) are obtained utilizing NASCET criteria, using the distal internal carotid diameter as the denominator. CONTRAST:  68mL OMNIPAQUE IOHEXOL 350 MG/ML SOLN COMPARISON:  Noncontrast head CT 07/29/2019 FINDINGS: CTA NECK FINDINGS The examination is mildly motion degraded. Aortic arch: Common origin of the innominate and left common carotid arteries. The visualized aortic arch is unremarkable. No innominate or proximal subclavian artery stenosis. Right carotid system: CCA and ICA patent within the neck  without stenosis. Left carotid system: CCA and ICA patent within the neck without stenosis. Vertebral arteries: The right vertebral artery is dominant. The vertebral arteries are patent within the neck bilaterally without stenosis. Skeleton: No acute bony abnormality or aggressive osseous lesion identified. Cervical spondylosis without high-grade bony spinal canal narrowing. Other neck: No neck mass or cervical lymphadenopathy. Upper chest: No consolidation within the imaged lung apices. Review of the MIP images confirms the above findings CTA HEAD FINDINGS Anterior circulation: The intracranial internal carotid arteries are patent without stenosis. The M1 middle cerebral arteries are patent without significant stenosis. No M2 proximal branch occlusion or high-grade proximal stenosis is identified. The anterior cerebral arteries are patent without high-grade proximal stenosis. No intracranial aneurysm is identified.  Posterior circulation: The dominant intracranial right vertebral artery is patent without stenosis and supplies the basilar artery. The non dominant intracranial left vertebral artery is markedly diminutive beyond the origin of the left PICA, although faintly patent. The basilar artery is patent without significant stenosis. Predominantly fetal origin of the right posterior cerebral artery which is patent without significant proximal stenosis. The left posterior cerebral artery is patent without significant proximal stenosis. A small left posterior communicating artery is present. Venous sinuses: Within limitations of contrast timing, no convincing thrombus. Anatomic variants: As described Review of the MIP images confirms the above findings IMPRESSION: CTA neck: The bilateral common carotid, internal carotid and vertebral arteries are patent within the neck without stenosis. The right vertebral artery is dominant. CTA head: 1. No intracranial large vessel occlusion. 2. The intracranial left vertebral artery is markedly diminutive beyond the origin of the left PICA. This is suspected largely on a developmental basis given significant non dominance. A significant V4 left vertebral artery stenosis cannot be definitively excluded. The dominant intracranial right vertebral artery is widely patent and supplies the basilar artery. 3. Otherwise, no evidence of proximal high-grade arterial stenosis. Electronically Signed   By: Kellie Simmering DO   On: 07/29/2019 15:23   DG Chest Port 1 View  Result Date: 07/29/2019 CLINICAL DATA:  Altered level of consciousness, CPR EXAM: PORTABLE CHEST 1 VIEW COMPARISON:  07/29/2019 at 0211 hours FINDINGS: The heart size and mediastinal contours are within normal limits. Linear right basilar atelectasis. Lungs otherwise clear. Probable nondisplaced rib fractures involving the lateral aspects of the left third, fourth, and fifth ribs. No pneumothorax. IMPRESSION: Probable nondisplaced rib  fractures involving the lateral aspects of the left third, fourth, and fifth ribs. No pneumothorax. Electronically Signed   By: Davina Poke D.O.   On: 07/29/2019 14:03   DG Chest Port 1 View  Result Date: 07/29/2019 CLINICAL DATA:  85 year old male with shortness of breath. EXAM: PORTABLE CHEST 1 VIEW COMPARISON:  Chest radiograph dated 07/15/2019. FINDINGS: No focal consolidation, pleural effusion or pneumothorax. Right lung base atelectasis/scarring. The cardiac silhouette is within normal limits. Osteopenia with degenerative changes of the spine. No acute osseous pathology. IMPRESSION: No acute cardiopulmonary process. Electronically Signed   By: Anner Crete M.D.   On: 07/29/2019 02:37    EKG: Independently reviewed.  No acute ST-T changes, no PR or QTc interval changes  Assessment/Plan Active Problems:   Seizure (HCC)   Acute hypoxic respiratory failure -Suspect aspiration pneumonia, will cover with Unasyn for now -We will also check a D-dimer level, consider further CT angiogram study -Recommend outpatient ENT evaluation given the patient daughter described patient has some recently developed hoarseness.  Yesterday's ED record showing that patient was wheezing, but no confirmed Hx  of COPD/Asthma but only seasonal allergy/Sinusitis?  -Check Echo to rule out any cario etiology or pulm HTN -Check a ABG, to differential pulm vs non-pulm -D/W Daughter/POA at bedside, all questions answered with my best knowledge  Seizure EEG pending Loaded with Keppra in ER, neurology on board Continue/resume Depakote, also check a Depakote level As needed Ativan  Status post cardiopulmonary arrest According to the nursing home record patient was bradycardia and apneic, which implying that hypoxia might be the reason patient was coded. Somewhat stabilized on a nonrebreather for at 15 L, discussed with patient daughter regarding [place], confirmed patient is DNR.  Place patient in stepdown  unit for tonight.   DVT prophylaxis: Lovenox Code Status: DNR Family Communication: Daughter at bedside Disposition Plan: Depends on results of work-up and clinical progress, probably can go home in 2 to 3 days Consults called: Neurology Dr. Tamala Julian Admission status: PCU   Lequita Halt MD [facility] Pager [phone]   07/29/2019, 5:04 PM

## 2019-07-29 NOTE — Progress Notes (Signed)
COVID 19 positive, given pt had significant hypoxia, will start Remdisivir and steroid.

## 2019-07-29 NOTE — Consult Note (Addendum)
Neurology Consultation  Reason for Consult: Possible seizure versus status Referring Physician: Tyrone Nine  CC: Seizure  History is obtained from: Chart  HPI: [name] is a 85 y.o. male with past medical history of alcohol withdrawal seizures, dementia, hypertension and alcohol dependence.  Due to possible seizure versus status while in the ED neurology was asked to consult and evaluate patient.  EMS was again called out to [facility] and found patient sitting in the chair at approximately 1300 hrs.  He began to fall from the chair and was assisted to the floor became unresponsive with agonal respirations.  Staff started CPR.  On arrival to the scene EMS found agonal respirations assisted with BMV while in route patient was noted to have a seizure given 2.5 mg Versed in addition she was given 1 mg Narcan secondary to noting pinpoint pupils.  This did not seem to have any response.  While in the room it was also noted that patient suddenly became stiff, eyes forcing upward gaze, moving right side with significant tone on the left side.  There was concern for status thus neurology was called to bedside.  Patient was given 2 mg of Ativan and started 1 g of Keppra.  After the Ativan patient's left-sided tonic phase had stopped.  CTA of head was obtained which did not show any large vessel occlusion.  Patient currently at this point postictal    ED course  Relevant labs include -ethanol active,  urinalysis pending, urine culture pending, CT head shows-CT head showed no intracranial abnormality  Chart review-patient was seen in the ED on 3/16 blottable expiratory wheezing upon entering the room.  At that time patient was breathing at 22 breaths/min and 100% on room air.  That time urinalysis did not show any infection.  While in the ED patient had another episode of noisy breathing and his heart rate went up to 110s.  Per note on recheck the patient was sleeping in bed comfortable.  It seemed to be  that his wheezing was more audible when he is awake than when he is asleep.  Patient at that time was discharged at 8 AM in the morning.     Past Medical History:  Diagnosis Date  . Alcohol dependence ([place])    Associated with thrombocytopenia, anemia, elevated LFTs, hypomagnesemia  . Anemia   . Arthritis   . Colon cancer ([place])   . Dementia ([place])    Vascular and alcohol related.  Marland Kitchen HTN (hypertension)   . Seizures ([place])    Related to withdrawal, no seizure in years    Family History  Problem Relation Age of Onset  . Colon cancer Neg Hx    Social History:   reports that he quit smoking about 43 years ago. He smoked 1.00 pack per day. He has never used smokeless tobacco. He reports current alcohol use. He reports that he does not use drugs.  Medications No current facility-administered medications for this encounter.  Current Outpatient Medications:  .  acetaminophen (TYLENOL) 500 MG tablet, Take 500 mg by mouth 2 (two) times a day. Not to exceed 2000 mg in 24 hours.  Call MD if fever is above 101F  Additional 500 mg as needed every 4 hours x 24 hrs, Disp: , Rfl:  .  alum & mag hydroxide-simeth (MAALOX/MYLANTA) [id number] MG/5ML suspension, Take 30 mLs by mouth every 6 (six) hours as needed for indigestion or heartburn. Reported on 11/11/2015, Disp: , Rfl:  .  divalproex (DEPAKOTE SPRINKLE) 125  MG capsule, Take 125 mg by mouth 2 (two) times daily. , Disp: , Rfl:  .  guaifenesin (ROBITUSSIN) 100 MG/5ML syrup, Take 200 mg by mouth 4 (four) times daily as needed for cough. Reported on 11/11/2015, Disp: , Rfl:  .  latanoprost (XALATAN) 0.005 % ophthalmic solution, Place 1 drop into both eyes at bedtime as needed (as Tolerated). (if patient will tolerate), Disp: , Rfl:  .  loperamide (LOPERAMIDE A-D) 2 MG tablet, Take 2 mg by mouth 4 (four) times daily as needed for diarrhea or loose stools. Reported on 11/11/2015, Disp: , Rfl:  .  magnesium hydroxide (MILK OF MAGNESIA) 400 MG/5ML suspension,  Take 30 mLs by mouth at bedtime as needed for mild constipation. Reported on 11/11/2015, Disp: , Rfl:  .  neomycin-bacitracin-polymyxin (NEOSPORIN) ointment, Apply 1 application topically daily as needed (Minor skin tears or abrasions clean normal sale apply neosporin). , Disp: , Rfl:  .  ondansetron (ZOFRAN) 4 MG tablet, Take 4 mg by mouth every 8 (eight) hours as needed for nausea or vomiting. Reported on 11/11/2015, Disp: , Rfl:  .  vitamin B-12 (CYANOCOBALAMIN) 1000 MCG tablet, Take 1,000 mcg by mouth daily., Disp: , Rfl:   ROS: Unable to obtain due to postictal state   Exam: Current vital signs: BP (!) 178/133   Pulse (!) 140   Temp (!) 96.3 F (35.7 C) (Rectal)   Resp (!) 26   SpO2 100%  Vital signs in last 24 hours: Temp:  [96.3 F (35.7 C)-98.6 F (37 C)] 96.3 F (35.7 C) (03/16 1428) Pulse Rate:  [67-140] 140 (03/16 1412) Resp:  [11-28] 26 (03/16 1430) BP: (112-178)/(58-136) 178/133 (03/16 1430) SpO2:  [94 %-100 %] 100 % (03/16 1412)   Constitutional: Appears well-developed and well-nourished.  Eyes: No scleral injection HENT: No OP obstrucion Head: Normocephalic.  Cardiovascular: Normal rate and regular rhythm.  Respiratory: Effort normal, non-labored breathing GI: Soft.  No distension. There is no tenderness.  Skin: WDI Neuro: Mental Status: Currently postictal. Cranial Nerves: II: No blink to threat III,IV, VI: Eyes initially forcefully deviated vertically however now disconjugate.  Pupils are equal sluggishly round V: No wince to noxious stimuli VII: Facial movement is symmetric.  VIII: Unable to obtain secondary to postictal state X:  Unable to obtain secondary to postictal state XI:  Unable to obtain secondary to postictal state XII: Unable to obtain secondary to postictal state Motor: Initially patient had tonic left arm with left leg moving right leg and arm spontaneously.  After Ativan and Keppra patient's limbs were flaccid. Sensory: No response to  noxious stimuli Deep Tendon Reflexes: 2+ and symmetric in the biceps and patellae.  Plantars: Toes are downgoing bilaterally.  Cerebellar:  Unable to obtain secondary to postictal state  Labs I have reviewed labs in epic and the results pertinent to this consultation are:   CBC    Component Value Date/Time   WBC 11.7 (H) 07/29/2019 1400   RBC 4.44 07/29/2019 1400   HGB 15.0 07/29/2019 1415   HGB 11.0 (L) 03/16/2015 1309   HCT 44.0 07/29/2019 1415   HCT 34.9 (L) 03/16/2015 1309   PLT 292 07/29/2019 1400   PLT 224 03/16/2015 1309   MCV 100.9 (H) 07/29/2019 1400   MCV 93.5 03/16/2015 1309   MCH 30.6 07/29/2019 1400   MCHC 30.4 07/29/2019 1400   RDW 12.2 07/29/2019 1400   RDW 13.1 03/16/2015 1309   LYMPHSABS 3.2 07/29/2019 1400   LYMPHSABS 2.9 03/16/2015 1309   MONOABS  0.9 07/29/2019 1400   MONOABS 0.6 03/16/2015 1309   EOSABS 0.0 07/29/2019 1400   EOSABS 0.3 03/16/2015 1309   BASOSABS 0.1 07/29/2019 1400   BASOSABS 0.0 03/16/2015 1309    CMP     Component Value Date/Time   NA 138 07/29/2019 1415   NA 139 03/16/2015 1309   K 5.1 07/29/2019 1415   K 4.0 03/16/2015 1309   CL 96 (L) 07/28/2019 1901   CO2 27 07/28/2019 1901   CO2 25 03/16/2015 1309   GLUCOSE 95 07/28/2019 1901   GLUCOSE 101 03/16/2015 1309   BUN 18 07/28/2019 1901   BUN 22.5 03/16/2015 1309   CREATININE 1.07 07/28/2019 1901   CREATININE 1.3 03/16/2015 1309   CALCIUM 9.3 07/28/2019 1901   CALCIUM 9.7 03/16/2015 1309   PROT 7.8 07/15/2019 1110   PROT 8.4 (H) 03/16/2015 1309   ALBUMIN 3.2 (L) 07/15/2019 1110   ALBUMIN 3.4 (L) 03/16/2015 1309   AST 26 07/15/2019 1110   AST 30 03/16/2015 1309   ALT 17 07/15/2019 1110   ALT 23 03/16/2015 1309   ALKPHOS 49 07/15/2019 1110   ALKPHOS 123 03/16/2015 1309   BILITOT 1.1 07/15/2019 1110   BILITOT 0.50 03/16/2015 1309   GFRNONAA >60 07/28/2019 1901   GFRAA >60 07/28/2019 1901    Lipid Panel     Component Value Date/Time   CHOL  07/23/2008 0626     98        ATP III CLASSIFICATION:  <200     mg/dL   Desirable  200-239  mg/dL   Borderline High  >=240    mg/dL   High          TRIG 124 07/23/2008 0626   HDL 21 (L) 07/23/2008 0626   CHOLHDL 4.7 07/23/2008 0626   VLDL 25 07/23/2008 0626   LDLCALC  07/23/2008 0626    52        Total Cholesterol/HDL:CHD Risk Coronary Heart Disease Risk Table                     Men   Women  1/2 Average Risk   3.4   3.3  Average Risk       5.0   4.4  2 X Average Risk   9.6   7.1  3 X Average Risk  23.4   11.0        Use the calculated Patient Ratio above and the CHD Risk Table to determine the patient's CHD Risk.        ATP III CLASSIFICATION (LDL):  <100     mg/dL   Optimal  100-129  mg/dL   Near or Above                    Optimal  130-159  mg/dL   Borderline  160-189  mg/dL   High  >190     mg/dL   Very High     Imaging I have reviewed the images obtained:  CT-scan of the brain-no intracranial abnormalities  Etta Quill PA-C [facility] [phone]  M-F  (9:00 am- 5:00 PM)  07/29/2019, 3:09 PM   I have seen the patient and reviewed the above note.   He was initially found presumably pulseless and CPR was initiated with subsequent seizure-like activity en route.    Assessment:  85 year old male with history of alcohol withdrawal seizures and dementia presented to the hospital for altered mental status and found to have seizures  en route.  Based on his initial presentation, I strongly suspect that he was in status epilepticus on arrival which broke with 2 mg of IV Ativan.  EEG negative for ongoing seizures.  At this time etiology is unclear, he does have a history of seizures, though presumably in the setting of alcohol withdrawal.  Also with his history of dementia, this is a possible source of seizure as well.  Currently afebrile with no signs of infection other than marginal leukocytosis which is likely demargination associated with  seizure.  Recommendations: -EEG --MRI brain -Seizure precautions -increase depakote to 500mg  BID, continue IV keppra until patient is reliably taking PO.    Roland Rack, MD [facility] [phone]  If 7pm- 7am, please page neurology on call as listed in [place].

## 2019-07-29 NOTE — ED Notes (Signed)
eeg at the bedside

## 2019-07-29 NOTE — ED Triage Notes (Signed)
Pt bib ems from [facility] sitting in chair and pt started to fall out chair, assisted to floor, went unresponsive agonal resp, staff started cpr. When ems arrived on scene pt good pulse, agonal respirations, assisted vent with BVM for approx 20 mins. En route pt had a seizure given 2.5mg  versed. Pupils pin point, given 1mg  narcan.

## 2019-07-29 NOTE — ED Notes (Signed)
Got patient into a gown on the monitor did ekg shown to Dr Tyrone Nine patient is resting with nurses at bedside

## 2019-07-29 NOTE — Procedures (Signed)
Patient Name: [name]  MRN: [id number]  Epilepsy Attending: Lora Havens  Referring Physician/Provider: Etta Quill, PA Date: 07/29/2019 Duration: 26.07 minutes  Patient history: 85 year old male with history of alcohol withdrawal seizures, dementia who presented after seizure-like episode.  EEG to evaluate for seizures.  Level of alertness: Lethargic  AEDs during EEG study: Ativan, Depakote  Technical aspects: This EEG study was done with scalp electrodes positioned according to the 10-20 International system of electrode placement. Electrical activity was acquired at a sampling rate of 500Hz  and reviewed with a high frequency filter of 70Hz  and a low frequency filter of 1Hz . EEG data were recorded continuously and digitally stored.   Description: No clear posterior dominant was seen.  EEG showed continuous generalized low amplitude 2 to 3 Hz delta slowing.  Hyperventilation and photic stimulation were not performed.    Of note, EEG was technically difficult secondary to significant myogenic artifact.  Abnormality - Continuous slow, generalized  IMPRESSION: This technically difficult study is suggestive of moderate to severe diffuse encephalopathy, nonspecific to etiology. No seizures or epileptiform discharges were seen throughout the recording.  Carinne Brandenburger Barbra Sarks

## 2019-07-29 NOTE — ED Notes (Signed)
Multiple attempts made to get pts blood ordered with no success. Attempted x 5

## 2019-07-29 NOTE — ED Notes (Signed)
Pt returned from MRI °

## 2019-07-29 NOTE — ED Notes (Signed)
Pt arrived from waiting room. Audible expiratory wheezing upon entering the room. Pt is 100% on room air breathing 22/min. Pt able to state his name but will not provide any other information. Pt producing large amount of yellow mucous.

## 2019-07-29 NOTE — Progress Notes (Addendum)
Pharmacy Antibiotic Note  [name] is a 85 y.o. male admitted on 07/29/2019 with aspiration pneumonia.  Pharmacy has been consulted for Unasyn dosing.  WBC 9.0>11.7, afebrile.  Plan: Unasyn 3g IV Q8h F/u clinical progress, cultures, and LOT  Height: 5\' 4"  (162.6 cm) Weight: 119 lb 0.8 oz (54 kg)(Per recent visit <1 month ago) IBW/kg (Calculated) : 59.2  Temp (24hrs), Avg:97.5 F (36.4 C), Min:96.3 F (35.7 C), Max:98.6 F (37 C)  Recent Labs  Lab 07/28/19 1901 07/29/19 1400  WBC 9.0 11.7*  CREATININE 1.07 1.21    Estimated Creatinine Clearance: 35.3 mL/min (by C-G formula based on SCr of 1.21 mg/dL).    No Known Allergies  Antimicrobials this admission: 3/16 Unasyn >>   Dose adjustments this admission: N/A  Microbiology results: 3/16 UCx: sent   Thank you for allowing pharmacy to be a part of this patient's care.  Kennon Holter, PharmD PGY1 Ambulatory Care Pharmacy Resident 07/29/2019 5:08 PM

## 2019-07-29 NOTE — ED Provider Notes (Signed)
[facility] EMERGENCY DEPARTMENT Provider Note   CSN: [id number] Arrival date & time: 07/29/19  1341     History Chief Complaint  Patient presents with  . Altered Mental Status    [name] is a 85 y.o. male.  85 yo M with a chief complaint of unresponsiveness.  The patient was at his skilled nursing facility and noted to be less responsive than normal.  Upon assessment they thought that he had stopped breathing and lost pulses and so started CPR.  Upon EMS arrival the patient did have pulses he was significantly bradycardia and apneic.  They ended up giving him Narcan without improvement.  Back to him in route with improvement of his mental status and then the patient had what appeared to be a tonic-clonic seizure.  They gave 2-1/2 mg of Versed.  Level 5 caveat altered mental status.   Altered Mental Status      Past Medical History:  Diagnosis Date  . Alcohol dependence ([place])    Associated with thrombocytopenia, anemia, elevated LFTs, hypomagnesemia  . Anemia   . Arthritis   . Colon cancer ([place])   . Dementia ([place])    Vascular and alcohol related.  Marland Kitchen HTN (hypertension)   . Seizures ([place])    Related to withdrawal, no seizure in years    Patient Active Problem List   Diagnosis Date Noted  . Aspiration pneumonitis ([place]) 11/08/2018  . Aspiration pneumonia ([place]) 11/08/2018  . Dementia ([place])   . History of alcohol use disorder   . History of seizures   . Nausea and vomiting   . Rib fracture 03/16/2015  . Anemia 03/16/2015  . Benign essential HTN 12/24/2014  . 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[place]) 12/16/2014    Past Surgical History:  Procedure Laterality Date  . COLONOSCOPY    . HEMICOLECTOMY  12/2014  . ORIF FEMORAL NECK FRACTURE W/ [facility]         Family History  Problem Relation Age of Onset  . Colon cancer Neg Hx     Social History   Tobacco Use  . Smoking status: Former Smoker    Packs/day: 1.00    Quit date: 12/14/1975    Years since  quitting: 43.6  . Smokeless tobacco: Never Used  Substance Use Topics  . Alcohol use: Yes    Alcohol/week: 0.0 standard drinks    Comment: USED TO DRINK BEER REGULARLY NOW DRINKS ABOUT 2 BEERS PER MONTH  . Drug use: No    Home Medications Prior to Admission medications   Medication Sig Start Date End Date Taking? Authorizing Provider  acetaminophen (TYLENOL) 500 MG tablet Take 500 mg by mouth 2 (two) times a day. Not to exceed 2000 mg in 24 hours.  Call MD if fever is above 101F  Additional 500 mg as needed every 4 hours x 24 hrs    [provider]  alum & mag hydroxide-simeth (MAALOX/MYLANTA) 200-200-20 MG/5ML suspension Take 30 mLs by mouth every 6 (six) hours as needed for indigestion or heartburn. Reported on 11/11/2015    [provider]  divalproex (DEPAKOTE SPRINKLE) 125 MG capsule Take 125 mg by mouth 2 (two) times daily.  09/23/18   [provider]  guaifenesin (ROBITUSSIN) 100 MG/5ML syrup Take 200 mg by mouth 4 (four) times daily as needed for cough. Reported on 11/11/2015    [provider]  latanoprost (XALATAN) 0.005 % ophthalmic solution Place 1 drop into both eyes at bedtime as needed (as Tolerated). (if  patient will tolerate) 10/08/18   [provider]  loperamide (LOPERAMIDE A-D) 2 MG tablet Take 2 mg by mouth 4 (four) times daily as needed for diarrhea or loose stools. Reported on 11/11/2015    [provider]  magnesium hydroxide (MILK OF MAGNESIA) 400 MG/5ML suspension Take 30 mLs by mouth at bedtime as needed for mild constipation. Reported on 11/11/2015    [provider]  neomycin-bacitracin-polymyxin (NEOSPORIN) ointment Apply 1 application topically daily as needed (Minor skin tears or abrasions clean normal sale apply neosporin).     [provider]  ondansetron (ZOFRAN) 4 MG tablet Take 4 mg by mouth every 8 (eight) hours as needed for nausea or vomiting. Reported on 11/11/2015    [provider]  vitamin B-12 (CYANOCOBALAMIN) 1000 MCG tablet Take 1,000 mcg by mouth daily.    [provider]    Allergies    Patient has no known allergies.  Review of Systems   Review of Systems  Unable to perform ROS: Mental status change    Physical Exam Updated Vital Signs BP (!) 96/57   Pulse (!) 107   Temp (!) 96.3 F (35.7 C) (Rectal)   Resp 17   SpO2 90%   Physical Exam Vitals and nursing note reviewed.  Constitutional:      Appearance: He is well-developed.  HENT:     Head: Normocephalic and atraumatic.  Eyes:     Pupils: Pupils are equal, round, and reactive to light.  Neck:     Vascular: No JVD.  Cardiovascular:     Rate and Rhythm: Normal rate and regular rhythm.     Heart sounds: No murmur. No friction rub. No gallop.   Pulmonary:     Effort: No respiratory distress.     Breath sounds: No wheezing.  Abdominal:     General: There is no distension.     Tenderness: There is no abdominal tenderness. There is no guarding or rebound.  Musculoskeletal:        General: Normal range of motion.     Cervical back: Normal range of motion and neck supple.  Skin:    Coloration: Skin is not pale.     Findings: No rash.  Neurological:     Mental Status: He is alert and oriented to person, place, and time.  Psychiatric:        Behavior: Behavior normal.     ED Results / Procedures / Treatments   Labs (all labs ordered are listed, but only abnormal results are displayed) Labs Reviewed  COMPREHENSIVE METABOLIC PANEL - Abnormal; Notable for the following components:      Result Value   CO2 19 (*)    Glucose, Bld 129 (*)    Albumin 2.8 (*)    AST 44 (*)    Total Bilirubin 1.8 (*)    GFR calc non Af Amer 55 (*)    Anion gap 21 (*)    All other components within normal limits  CBC WITH DIFFERENTIAL/PLATELET - Abnormal; Notable for the following components:   WBC 11.7 (*)    MCV 100.9 (*)    Abs Immature Granulocytes 0.29 (*)    All other components within  normal limits  POCT I-STAT EG7 - Abnormal; Notable for the following components:   pO2, Ven 185.0 (*)    Calcium, Ion 1.03 (*)    All other components within normal limits  URINE CULTURE  RESPIRATORY PANEL BY RT PCR (FLU A&B, COVID)  AMMONIA  ETHANOL  URINALYSIS, ROUTINE W REFLEX MICROSCOPIC  CBG MONITORING, ED  TROPONIN I (HIGH SENSITIVITY)    EKG EKG Interpretation  Date/Time:  Tuesday July 29 2019 13:46:50 EDT Ventricular Rate:  134 PR Interval:    QRS Duration: 75 QT Interval:  308 QTC Calculation: 460 R Axis:   -11 Text Interpretation: Sinus tachycardia Borderline T abnormalities, lateral leads No significant change since last tracing Confirmed by Deno Etienne [phone]) on 07/29/2019 2:45:49 PM   Radiology CT Angio Head W or Wo Contrast  Result Date: 07/29/2019 CLINICAL DATA:  Encephalopathy. Additional history provided: Unresponsive episode, at renal respirations, CPR, seizure EN route EXAM: CT ANGIOGRAPHY HEAD AND NECK TECHNIQUE: Multidetector CT imaging of the head and neck was performed using the standard protocol during bolus administration of intravenous contrast. Multiplanar CT image reconstructions and MIPs were obtained to evaluate the vascular anatomy. Carotid stenosis measurements (when applicable) are obtained utilizing NASCET criteria, using the distal internal carotid diameter as the denominator. CONTRAST:  75mL OMNIPAQUE IOHEXOL 350 MG/ML SOLN COMPARISON:  Noncontrast head CT 07/29/2019 FINDINGS: CTA NECK FINDINGS The examination is mildly motion degraded. Aortic arch: Common origin of the innominate and left common carotid arteries. The visualized aortic arch is unremarkable. No innominate or proximal subclavian artery stenosis. Right carotid system: CCA and ICA patent within the neck without stenosis. Left carotid system: CCA and ICA patent within the neck without stenosis. Vertebral arteries: The right vertebral artery is dominant. The vertebral arteries are patent  within the neck bilaterally without stenosis. Skeleton: No acute bony abnormality or aggressive osseous lesion identified. Cervical spondylosis without high-grade bony spinal canal narrowing. Other neck: No neck mass or cervical lymphadenopathy. Upper chest: No consolidation within the imaged lung apices. Review of the MIP images confirms the above findings CTA HEAD FINDINGS Anterior circulation: The intracranial internal carotid arteries are patent without stenosis. The M1 middle cerebral arteries are patent without significant stenosis. No M2 proximal branch occlusion or high-grade proximal stenosis is identified. The anterior cerebral arteries are patent without high-grade proximal stenosis. No intracranial aneurysm is identified. Posterior circulation: The dominant intracranial right vertebral artery is patent without stenosis and supplies the basilar artery. The non dominant intracranial left vertebral artery is markedly diminutive beyond the origin of the left PICA, although faintly patent. The basilar artery is patent without significant stenosis. Predominantly fetal origin of the right posterior cerebral artery which is patent without significant proximal stenosis. The left posterior cerebral artery is patent without significant proximal stenosis. A small left posterior communicating artery is present. Venous sinuses: Within limitations of contrast timing, no convincing thrombus. Anatomic variants: As described Review of the MIP images confirms the above findings IMPRESSION: CTA neck: The bilateral common carotid, internal carotid and vertebral arteries are patent within the neck without stenosis. The right vertebral artery is dominant. CTA head: 1. No intracranial large vessel occlusion. 2. The intracranial left vertebral artery is markedly diminutive beyond the origin of the left PICA. This is suspected largely on a developmental basis given significant non dominance. A significant V4 left vertebral artery  stenosis cannot be definitively excluded. The dominant intracranial right vertebral artery is widely patent and supplies the basilar artery. 3. Otherwise, no evidence of proximal high-grade arterial stenosis. Electronically Signed   By: Kellie Simmering DO   On: 07/29/2019 15:23   CT HEAD WO CONTRAST  Result Date: 07/29/2019 CLINICAL DATA:  Encephalopathy. EXAM: CT HEAD WITHOUT CONTRAST TECHNIQUE: Contiguous axial images were obtained from the base of the skull through  the vertex without intravenous contrast. COMPARISON:  Head CT 03/07/2015 FINDINGS: Brain: There is no evidence of acute intracranial hemorrhage, intracranial mass, midline shift or extra-axial fluid collection.No demarcated cortical infarction. Moderate ill-defined hypoattenuation within the cerebral white matter is similar to prior examination 03/07/2015 and nonspecific, but consistent with chronic small vessel ischemic disease. Moderate generalized parenchymal atrophy has progressed. Vascular: No hyperdense vessel.  Atherosclerotic calcifications. Skull: Normal. Negative for fracture or focal lesion. Sinuses/Orbits: Visualized orbits demonstrate no acute abnormality. No significant paranasal sinus disease or mastoid effusion at the imaged levels. Other: Partially visualized support tubes. IMPRESSION: No evidence of acute intracranial abnormality. Moderate chronic small vessel ischemic disease, stable as compared to head CT 03/07/2015. Moderate generalized parenchymal atrophy has progressed. Electronically Signed   By: Kellie Simmering DO   On: 07/29/2019 14:16   CT Angio Neck W and/or Wo Contrast  Result Date: 07/29/2019 CLINICAL DATA:  Encephalopathy. Additional history provided: Unresponsive episode, at renal respirations, CPR, seizure EN route EXAM: CT ANGIOGRAPHY HEAD AND NECK TECHNIQUE: Multidetector CT imaging of the head and neck was performed using the standard protocol during bolus administration of intravenous contrast. Multiplanar CT  image reconstructions and MIPs were obtained to evaluate the vascular anatomy. Carotid stenosis measurements (when applicable) are obtained utilizing NASCET criteria, using the distal internal carotid diameter as the denominator. CONTRAST:  16mL OMNIPAQUE IOHEXOL 350 MG/ML SOLN COMPARISON:  Noncontrast head CT 07/29/2019 FINDINGS: CTA NECK FINDINGS The examination is mildly motion degraded. Aortic arch: Common origin of the innominate and left common carotid arteries. The visualized aortic arch is unremarkable. No innominate or proximal subclavian artery stenosis. Right carotid system: CCA and ICA patent within the neck without stenosis. Left carotid system: CCA and ICA patent within the neck without stenosis. Vertebral arteries: The right vertebral artery is dominant. The vertebral arteries are patent within the neck bilaterally without stenosis. Skeleton: No acute bony abnormality or aggressive osseous lesion identified. Cervical spondylosis without high-grade bony spinal canal narrowing. Other neck: No neck mass or cervical lymphadenopathy. Upper chest: No consolidation within the imaged lung apices. Review of the MIP images confirms the above findings CTA HEAD FINDINGS Anterior circulation: The intracranial internal carotid arteries are patent without stenosis. The M1 middle cerebral arteries are patent without significant stenosis. No M2 proximal branch occlusion or high-grade proximal stenosis is identified. The anterior cerebral arteries are patent without high-grade proximal stenosis. No intracranial aneurysm is identified. Posterior circulation: The dominant intracranial right vertebral artery is patent without stenosis and supplies the basilar artery. The non dominant intracranial left vertebral artery is markedly diminutive beyond the origin of the left PICA, although faintly patent. The basilar artery is patent without significant stenosis. Predominantly fetal origin of the right posterior cerebral  artery which is patent without significant proximal stenosis. The left posterior cerebral artery is patent without significant proximal stenosis. A small left posterior communicating artery is present. Venous sinuses: Within limitations of contrast timing, no convincing thrombus. Anatomic variants: As described Review of the MIP images confirms the above findings IMPRESSION: CTA neck: The bilateral common carotid, internal carotid and vertebral arteries are patent within the neck without stenosis. The right vertebral artery is dominant. CTA head: 1. No intracranial large vessel occlusion. 2. The intracranial left vertebral artery is markedly diminutive beyond the origin of the left PICA. This is suspected largely on a developmental basis given significant non dominance. A significant V4 left vertebral artery stenosis cannot be definitively excluded. The dominant intracranial right vertebral artery is widely patent  and supplies the basilar artery. 3. Otherwise, no evidence of proximal high-grade arterial stenosis. Electronically Signed   By: Kellie Simmering DO   On: 07/29/2019 15:23   DG Chest Port 1 View  Result Date: 07/29/2019 CLINICAL DATA:  Altered level of consciousness, CPR EXAM: PORTABLE CHEST 1 VIEW COMPARISON:  07/29/2019 at 0211 hours FINDINGS: The heart size and mediastinal contours are within normal limits. Linear right basilar atelectasis. Lungs otherwise clear. Probable nondisplaced rib fractures involving the lateral aspects of the left third, fourth, and fifth ribs. No pneumothorax. IMPRESSION: Probable nondisplaced rib fractures involving the lateral aspects of the left third, fourth, and fifth ribs. No pneumothorax. Electronically Signed   By: Davina Poke D.O.   On: 07/29/2019 14:03   DG Chest Port 1 View  Result Date: 07/29/2019 CLINICAL DATA:  85 year old male with shortness of breath. EXAM: PORTABLE CHEST 1 VIEW COMPARISON:  Chest radiograph dated 07/15/2019. FINDINGS: No focal  consolidation, pleural effusion or pneumothorax. Right lung base atelectasis/scarring. The cardiac silhouette is within normal limits. Osteopenia with degenerative changes of the spine. No acute osseous pathology. IMPRESSION: No acute cardiopulmonary process. Electronically Signed   By: Anner Crete M.D.   On: 07/29/2019 02:37    Procedures Procedures (including critical care time)  Medications Ordered in ED Medications  divalproex (DEPAKOTE SPRINKLE) capsule 500 mg (has no administration in time range)  LORazepam (ATIVAN) injection 2 mg (2 mg Intravenous Given 07/29/19 1436)  levETIRAcetam (KEPPRA) IVPB 1000 mg/100 mL premix (0 mg Intravenous Stopped 07/29/19 1504)  iohexol (OMNIPAQUE) 350 MG/ML injection 75 mL (75 mLs Intravenous Contrast Given 07/29/19 1503)  sodium chloride 0.9 % bolus 1,000 mL (1,000 mLs Intravenous New Bag/Given 07/29/19 1526)    ED Course  I have reviewed the triage vital signs and the nursing notes.  Pertinent labs & imaging results that were available during my care of the patient were reviewed by me and considered in my medical decision making (see chart for details).    MDM Rules/Calculators/A&P                      85 yo M with a cc of ams.  The patient was recently discharged from this facility and had an episode in his skilled nursing facility where there is some concern for loss of pulses.  He had CPR performed for short period of time and EMS arrived with intact pulses.  Bagged enroute to the ED.  Patient moving the right side much more than the left I discussed this with neurology who came and evaluated the patient at bedside.  Some concern for status.  Given Ativan and Keppra recommended medical admission.  Discussed with medicine for admission.  CRITICAL CARE Performed by: Cecilio Asper   Total critical care time: 35 minutes  Critical care time was exclusive of separately billable procedures and treating other patients.  Critical care was  necessary to treat or prevent imminent or life-threatening deterioration.  Critical care was time spent personally by me on the following activities: development of treatment plan with patient and/or surrogate as well as nursing, discussions with consultants, evaluation of patient's response to treatment, examination of patient, obtaining history from patient or surrogate, ordering and performing treatments and interventions, ordering and review of laboratory studies, ordering and review of radiographic studies, pulse oximetry and re-evaluation of patient's condition.  The patients results and plan were reviewed and discussed.   Any x-rays performed were independently reviewed by myself.   Differential  diagnosis were considered with the presenting HPI.  Medications  divalproex (DEPAKOTE SPRINKLE) capsule 500 mg (has no administration in time range)  LORazepam (ATIVAN) injection 2 mg (2 mg Intravenous Given 07/29/19 1436)  levETIRAcetam (KEPPRA) IVPB 1000 mg/100 mL premix (0 mg Intravenous Stopped 07/29/19 1504)  iohexol (OMNIPAQUE) 350 MG/ML injection 75 mL (75 mLs Intravenous Contrast Given 07/29/19 1503)  sodium chloride 0.9 % bolus 1,000 mL (1,000 mLs Intravenous New Bag/Given 07/29/19 1526)    Vitals:   07/29/19 1428 07/29/19 1430 07/29/19 1515 07/29/19 1530  BP:  (!) 178/133 (!) 85/53 (!) 96/57  Pulse:   (!) 109 (!) 107  Resp: (!) 28 (!) 26 (!) 22 17  Temp: (!) 96.3 F (35.7 C)     TempSrc: Rectal     SpO2:   97% 90%    Final diagnoses:  Status epilepticus ([place])    Admission/ observation were discussed with the admitting physician, patient and/or family and they are comfortable with the plan.   Final Clinical Impression(s) / ED Diagnoses Final diagnoses:  Status epilepticus [facility])    Rx / DC Orders ED Discharge Orders    None       Deno Etienne, DO 07/29/19 1557

## 2019-07-30 ENCOUNTER — Inpatient Hospital Stay (HOSPITAL_COMMUNITY): Payer: Medicare Other

## 2019-07-30 DIAGNOSIS — R0602 Shortness of breath: Secondary | ICD-10-CM

## 2019-07-30 DIAGNOSIS — J1282 Pneumonia due to coronavirus disease 2019: Secondary | ICD-10-CM

## 2019-07-30 LAB — TYPE AND SCREEN
ABO/RH(D): O POS
Antibody Screen: NEGATIVE

## 2019-07-30 LAB — CBC
HCT: 44.3 % (ref 39.0–52.0)
Hemoglobin: 13.8 g/dL (ref 13.0–17.0)
MCH: 30.3 pg (ref 26.0–34.0)
MCHC: 31.2 g/dL (ref 30.0–36.0)
MCV: 97.1 fL (ref 80.0–100.0)
Platelets: 238 10*3/uL (ref 150–400)
RBC: 4.56 MIL/uL (ref 4.22–5.81)
RDW: 12.1 % (ref 11.5–15.5)
WBC: 14.6 10*3/uL — ABNORMAL HIGH (ref 4.0–10.5)
nRBC: 0.1 % (ref 0.0–0.2)

## 2019-07-30 LAB — VALPROIC ACID LEVEL: Valproic Acid Lvl: 15 ug/mL — ABNORMAL LOW (ref 50.0–100.0)

## 2019-07-30 LAB — URINE CULTURE

## 2019-07-30 LAB — CBC WITH DIFFERENTIAL/PLATELET
Abs Immature Granulocytes: 0.03 10*3/uL (ref 0.00–0.07)
Basophils Absolute: 0 10*3/uL (ref 0.0–0.1)
Basophils Relative: 0 %
Eosinophils Absolute: 0 10*3/uL (ref 0.0–0.5)
Eosinophils Relative: 0 %
HCT: 41.6 % (ref 39.0–52.0)
Hemoglobin: 13.1 g/dL (ref 13.0–17.0)
Immature Granulocytes: 0 %
Lymphocytes Relative: 9 %
Lymphs Abs: 0.8 10*3/uL (ref 0.7–4.0)
MCH: 30.6 pg (ref 26.0–34.0)
MCHC: 31.5 g/dL (ref 30.0–36.0)
MCV: 97.2 fL (ref 80.0–100.0)
Monocytes Absolute: 0.2 10*3/uL (ref 0.1–1.0)
Monocytes Relative: 2 %
Neutro Abs: 8.4 10*3/uL — ABNORMAL HIGH (ref 1.7–7.7)
Neutrophils Relative %: 89 %
Platelets: 340 10*3/uL (ref 150–400)
RBC: 4.28 MIL/uL (ref 4.22–5.81)
RDW: 12.9 % (ref 11.5–15.5)
WBC: 9.4 10*3/uL (ref 4.0–10.5)
nRBC: 0 % (ref 0.0–0.2)

## 2019-07-30 LAB — BASIC METABOLIC PANEL
Anion gap: 18 — ABNORMAL HIGH (ref 5–15)
BUN: 19 mg/dL (ref 8–23)
CO2: 25 mmol/L (ref 22–32)
Calcium: 9 mg/dL (ref 8.9–10.3)
Chloride: 101 mmol/L (ref 98–111)
Creatinine, Ser: 1.03 mg/dL (ref 0.61–1.24)
GFR calc Af Amer: >60 mL/min (ref >60)
GFR calc non Af Amer: >60 mL/min (ref >60)
Glucose, Bld: 104 mg/dL — ABNORMAL HIGH (ref 70–99)
Potassium: 4.2 mmol/L (ref 3.5–5.1)
Sodium: 144 mmol/L (ref 135–145)

## 2019-07-30 LAB — ABO/RH: ABO/RH(D): O POS

## 2019-07-30 LAB — GLUCOSE, CAPILLARY
Glucose-Capillary: 130 mg/dL — ABNORMAL HIGH (ref 70–99)
Glucose-Capillary: 149 mg/dL — ABNORMAL HIGH (ref 70–99)

## 2019-07-30 LAB — BRAIN NATRIURETIC PEPTIDE: B Natriuretic Peptide: 173.3 pg/mL — ABNORMAL HIGH (ref 0.0–100.0)

## 2019-07-30 LAB — PROCALCITONIN: Procalcitonin: 0.21 ng/mL

## 2019-07-30 LAB — ECHOCARDIOGRAM LIMITED
Height: 64 in
Weight: 1904.8 oz

## 2019-07-30 LAB — TROPONIN I (HIGH SENSITIVITY): Troponin I (High Sensitivity): 58 ng/L — ABNORMAL HIGH (ref <18)

## 2019-07-30 LAB — LACTIC ACID, PLASMA: Lactic Acid, Venous: 1.3 mmol/L (ref 0.5–1.9)

## 2019-07-30 LAB — D-DIMER, QUANTITATIVE: D-Dimer, Quant: >20 ug/mL-FEU — ABNORMAL HIGH (ref 0.00–0.50)

## 2019-07-30 LAB — MAGNESIUM: Magnesium: 1.7 mg/dL (ref 1.7–2.4)

## 2019-07-30 LAB — C-REACTIVE PROTEIN: CRP: 7.1 mg/dL — ABNORMAL HIGH (ref <1.0)

## 2019-07-30 MED ORDER — HYDRALAZINE HCL 20 MG/ML IJ SOLN: 10.0000 mg | Freq: Four times a day (QID) | INTRAMUSCULAR | Status: DC | PRN

## 2019-07-30 MED ORDER — KCL IN DEXTROSE-NACL 10-5-0.45 MEQ/L-%-% IV SOLN
INTRAVENOUS | Status: DC
  Filled 2019-07-30 (×5): qty 1000

## 2019-07-30 MED ORDER — LORAZEPAM 2 MG/ML IJ SOLN: 1.0000 mg | INTRAMUSCULAR | Status: DC | PRN

## 2019-07-30 MED ORDER — CYANOCOBALAMIN 1000 MCG/ML IJ SOLN
1000.0000 ug | Freq: Once | INTRAMUSCULAR | Status: AC
  Administered 2019-07-30: 1000 ug via SUBCUTANEOUS
  Filled 2019-07-30: qty 1

## 2019-07-30 NOTE — Progress Notes (Signed)
vLTM setup  New leads  Rn instructed on use of event button   Event button tsted

## 2019-07-30 NOTE — ED Notes (Signed)
Lab ordered by admitted MD sent to the lab and lab tech called that they are hemolyze pt been stick more than 15 times by RN, RT, IV team and phlebotomist with no success. admitting provider notified. Pt may need central line.

## 2019-07-30 NOTE — ED Notes (Signed)
Attempted report 

## 2019-07-30 NOTE — Progress Notes (Signed)
PROGRESS NOTE                                                                                                                                                                                                             Patient Demographics:    [name], is a 85 y.o. male, DOB - [birth year], ZPS:[id number]  Outpatient Primary MD for the patient is Sande Brothers, MD    LOS - 1  Admit date - 07/29/2019    Chief Complaint  Patient presents with  . Altered Mental Status       Brief Narrative   [name] is a 85 y.o. male with medical history significant of remote history of alcohol dependence, hypertension, seizure, dementia, presented with unresponsiveness, he lives at a memory care unit and was noted to be less responsive for 24 to 48 hours prior to hospital visit.  In the ER he had a visualized tonic-clonic seizure, he was also found to have COVID-19 pneumonia along with a suspicion of aspiration pneumonia.  He was seen by neurology and admitted for further care.   Subjective:    [name] today remains in bed, appears to be in no distress but unable to answer questions or follow commands   Assessment  & Plan :     1. Acute Hypoxic Resp. Failure due to Acute Covid 19 Viral Pneumonitis during the ongoing 2020 Covid 19 Pandemic - he has mild to moderate disease at best, has been started on steroids and remdesivir.  Will be monitored.  Not a candidate for Actemra use.  Once more alert he will be encouraged to sit up in chair in the daytime use I-S and flutter valve for pulmonary toiletry and then prone in bed when at night.   Recent Labs  Lab 07/29/19 1630  SARSCOV2NAA POSITIVE*    Hepatic Function Latest Ref Rng & Units 07/29/2019 07/15/2019 11/11/2018  Total Protein 6.5 - 8.1 g/dL 7.5 7.8 6.9  Albumin 3.5 - 5.0 g/dL 2.8(L) 3.2(L) 3.6  AST 15 - 41 U/L 44(H) 26 58(H)  ALT 0 - 44 U/L 26 17 36  Alk Phosphatase 38 - 126 U/L  52 49 43  Total Bilirubin 0.3 - 1.2 mg/dL 1.8(H) 1.1 1.3(H)  Bilirubin, Direct 0.0 - 0.3 mg/dL - - -      2.  Breakthrough seizures.  Question if due to his decreased mental status he was unable to take his oral antiseizure medication at home, CTA & MRI nonacute, neuro on board, as needed IV Ativan and IV Keppra for now.  Monitor closely.  3.  Possible aspiration pneumonia.  Unasyn.  Speech to evaluate once mentation improves.  Currently n.p.o.  4.  Hypertension.  Due to n.p.o. status placed on as needed hydralazine.    Condition - Extremely Guarded  Family Communication  :  Daughter 07/30/19  Code Status :  DNR  Diet :   Diet Order            Diet NPO time specified  Diet effective now               Disposition Plan  : Stay in the hospital finished treatment for COVID-19 pneumonia, also being treated for breakthrough seizures.  Consults  :  Neuro  Procedures  :    EEG - nonspecific encephalopathy no ongoing seizures.  MRI Brain and CTA head and Neck - no acute changes  PUD Prophylaxis :    DVT Prophylaxis  :   Heparin   Lab Results  Component Value Date   PLT 340 07/30/2019    Inpatient Medications  Scheduled Meds: . dexamethasone (DECADRON) injection  6 mg Intravenous Q24H  . divalproex  500 mg Oral BID  . heparin  5,000 Units Subcutaneous Q12H  . ipratropium  2 puff Inhalation Q6H  . vitamin B-12  1,000 mcg Oral Daily   Continuous Infusions: . sodium chloride Stopped (07/30/19 0554)  . ampicillin-sulbactam (UNASYN) IV Stopped (07/30/19 0321)  . dextrose 5 % and 0.45 % NaCl with KCl 10 mEq/L    . levETIRAcetam Stopped (07/30/19 0500)  . remdesivir 100 mg in NS 100 mL     PRN Meds:.acetaminophen **OR** acetaminophen, guaiFENesin, guaiFENesin-dextromethorphan, hydrALAZINE, latanoprost, LORazepam, ondansetron **OR** ondansetron (ZOFRAN) IV  Antibiotics  :    Anti-infectives (From admission, onward)   Start     Dose/Rate Route Frequency Ordered Stop     07/30/19 1000  remdesivir 100 mg in sodium chloride 0.9 % 100 mL IVPB  Status:  Discontinued     100 mg 200 mL/hr over 30 Minutes Intravenous Daily 07/29/19 1847 07/29/19 1849   07/30/19 1000  remdesivir 100 mg in sodium chloride 0.9 % 100 mL IVPB     100 mg 200 mL/hr over 30 Minutes Intravenous Daily 07/29/19 1851 08/03/19 0959   07/30/19 0200  Ampicillin-Sulbactam (UNASYN) 3 g in sodium chloride 0.9 % 100 mL IVPB     3 g 200 mL/hr over 30 Minutes Intravenous Every 8 hours 07/29/19 1710     07/29/19 1900  remdesivir 200 mg in sodium chloride 0.9% 250 mL IVPB  Status:  Discontinued     200 mg 580 mL/hr over 30 Minutes Intravenous Once 07/29/19 1847 07/29/19 1849   07/29/19 1900  remdesivir 100 mg in sodium chloride 0.9 % 100 mL IVPB     100 mg 200 mL/hr over 30 Minutes Intravenous Every 30 min 07/29/19 1851 07/29/19 2218   07/29/19 1615  Ampicillin-Sulbactam (UNASYN) 3 g in sodium chloride 0.9 % 100 mL IVPB     3 g 200 mL/hr over 30 Minutes Intravenous  Once 07/29/19 1611 07/29/19 1928       Time Spent in minutes  30   Lala Lund M.D on 07/30/2019 at 10:10 AM  To page go to [email or web address] - password [email or web address]  [facility] -  Office  [phone]   See all Orders from today for further details    Objective:   Vitals:   07/30/19 0815 07/30/19 0830 07/30/19 0845 07/30/19 0930  BP: (!) 145/105 (!) 153/100 (!) 213/175 (!) 166/119  Pulse:      Resp: 18 (!) 24 16 (!) 26  Temp:      TempSrc:      SpO2:      Weight:      Height:        Wt Readings from Last 3 Encounters:  07/29/19 54 kg  07/08/19 54 kg  11/11/18 57.5 kg     Intake/Output Summary (Last 24 hours) at 07/30/2019 1010 Last data filed at 07/30/2019 0554 Gross per 24 hour  Intake 2237.06 ml  Output --  Net 2237.06 ml     Physical Exam  Awake but not alert, moving all 4 extremities by himself [name],PERRAL Supple Neck,No JVD, No cervical lymphadenopathy appriciated.  Symmetrical Chest wall  movement, Good air movement bilaterally, CTAB RRR,No Gallops,Rubs or new Murmurs, No Parasternal Heave +ve B.Sounds, Abd Soft, No tenderness, No organomegaly appriciated, No rebound - guarding or rigidity. No Cyanosis, Clubbing or edema, No new Rash or bruise       Data Review:    CBC Recent Labs  Lab 07/28/19 1901 07/29/19 1400 07/29/19 1415 07/29/19 1828 07/30/19 0546  WBC 9.0 11.7*  --   --  9.4  HGB 14.0 13.6 15.0 12.6* 13.1  HCT 45.4 44.8 44.0 37.0* 41.6  PLT 248 292  --   --  340  MCV 98.3 100.9*  --   --  97.2  MCH 30.3 30.6  --   --  30.6  MCHC 30.8 30.4  --   --  31.5  RDW 12.1 12.2  --   --  12.9  LYMPHSABS  --  3.2  --   --  0.8  MONOABS  --  0.9  --   --  0.2  EOSABS  --  0.0  --   --  0.0  BASOSABS  --  0.1  --   --  0.0    Chemistries  Recent Labs  Lab 07/28/19 1901 07/29/19 1400 07/29/19 1415 07/29/19 1828  NA 137 139 138 140  K 4.4 4.3 5.1 4.0  CL 96* 99  --   --   CO2 27 19*  --   --   GLUCOSE 95 129*  --   --   BUN 18 19  --   --   CREATININE 1.07 1.21  --   --   CALCIUM 9.3 9.0  --   --   AST  --  44*  --   --   ALT  --  26  --   --   ALKPHOS  --  52  --   --   BILITOT  --  1.8*  --   --   AMMONIA  --   --  29  --      ------------------------------------------------------------------------------------------------------------------ No results for input(s): CHOL, HDL, LDLCALC, TRIG, CHOLHDL, LDLDIRECT in the last 72 hours.  Lab Results  Component Value Date   HGBA1C 5.1 12/14/2014   ------------------------------------------------------------------------------------------------------------------ No results for input(s): TSH, T4TOTAL, T3FREE, THYROIDAB in the last 72 hours.  Invalid input(s): FREET3  Cardiac Enzymes No results for input(s): CKMB, TROPONINI, MYOGLOBIN in the last 168 hours.  Invalid input(s): CK ------------------------------------------------------------------------------------------------------------------      Component Value Date/Time   BNP 246.0 (H) 11/09/2018 0250  Micro Results Recent Results (from the past 240 hour(s))  Respiratory Panel by RT PCR (Flu A&B, Covid) - Nasopharyngeal Swab     Status: Abnormal   Collection Time: 07/29/19  4:30 PM   Specimen: Nasopharyngeal Swab  Result Value Ref Range Status   SARS Coronavirus 2 by RT PCR POSITIVE (A) NEGATIVE Final    Comment: RESULT CALLED TO, READ BACK BY AND VERIFIED WITHRhona Leavens RN 1817 07/29/19 A BROWNING (NOTE) SARS-CoV-2 target nucleic acids are DETECTED. SARS-CoV-2 RNA is generally detectable in upper respiratory specimens  during the acute phase of infection. Positive results are indicative of the presence of the identified virus, but do not rule out bacterial infection or co-infection with other pathogens not detected by the test. Clinical correlation with patient history and other diagnostic information is necessary to determine patient infection status. The expected result is Negative. Fact Sheet for Patients:  PinkCheek.be Fact Sheet for Healthcare Providers: GravelBags.it This test is not yet approved or cleared by the [place] FDA and  has been authorized for detection and/or diagnosis of SARS-CoV-2 by FDA under an Emergency Use Authorization (EUA).  This EUA will remain in effect (meaning this test can be used) for  the duration of  the COVID-19 declaration under Section 564(b)(1) of the Act, 21 U.S.C. section 360bbb-3(b)(1), unless the authorization is terminated or revoked sooner.    Influenza A by PCR NEGATIVE NEGATIVE Final   Influenza B by PCR NEGATIVE NEGATIVE Final    Comment: (NOTE) The Xpert Xpress SARS-CoV-2/FLU/RSV assay is intended as an aid in  the diagnosis of influenza from Nasopharyngeal swab specimens and  should not be used as a sole basis for treatment. Nasal washings and  aspirates are unacceptable for Xpert Xpress  SARS-CoV-2/FLU/RSV  testing. Fact Sheet for Patients: PinkCheek.be Fact Sheet for Healthcare Providers: GravelBags.it This test is not yet approved or cleared by the [place] FDA and  has been authorized for detection and/or diagnosis of SARS-CoV-2 by  FDA under an Emergency Use Authorization (EUA). This EUA will remain  in effect (meaning this test can be used) for the duration of the  Covid-19 declaration under Section 564(b)(1) of the Act, 21  U.S.C. section 360bbb-3(b)(1), unless the authorization is  terminated or revoked. Performed at [facility], [place] [address], [place], [place] [address]     Radiology Reports EEG  Result Date: 07/29/2019 Lora Havens, MD     07/29/2019  4:15 PM Patient Name: Jerry Clyne MRN: [id number] Epilepsy Attending: Lora Havens Referring Physician/Provider: Etta Quill, PA Date: 07/29/2019 Duration: 26.07 minutes Patient history: 85 year old male with history of alcohol withdrawal seizures, dementia who presented after seizure-like episode.  EEG to evaluate for seizures. Level of alertness: Lethargic AEDs during EEG study: Ativan, Depakote Technical aspects: This EEG study was done with scalp electrodes positioned according to the 10-20 International system of electrode placement. Electrical activity was acquired at a sampling rate of '500Hz'  and reviewed with a high frequency filter of '70Hz'  and a low frequency filter of '1Hz' . EEG data were recorded continuously and digitally stored. Description: No clear posterior dominant was seen.  EEG showed continuous generalized low amplitude 2 to 3 Hz delta slowing.  Hyperventilation and photic stimulation were not performed.  Of note, EEG was technically difficult secondary to significant myogenic artifact. Abnormality - Continuous slow, generalized IMPRESSION: This technically difficult study is suggestive of moderate to severe diffuse encephalopathy,  nonspecific to etiology. No seizures or epileptiform discharges were seen  throughout the recording. Lora Havens   CT Angio Head W or Wo Contrast  Result Date: 07/29/2019 CLINICAL DATA:  Encephalopathy. Additional history provided: Unresponsive episode, at renal respirations, CPR, seizure EN route EXAM: CT ANGIOGRAPHY HEAD AND NECK TECHNIQUE: Multidetector CT imaging of the head and neck was performed using the standard protocol during bolus administration of intravenous contrast. Multiplanar CT image reconstructions and MIPs were obtained to evaluate the vascular anatomy. Carotid stenosis measurements (when applicable) are obtained utilizing NASCET criteria, using the distal internal carotid diameter as the denominator. CONTRAST:  56m OMNIPAQUE IOHEXOL 350 MG/ML SOLN COMPARISON:  Noncontrast head CT 07/29/2019 FINDINGS: CTA NECK FINDINGS The examination is mildly motion degraded. Aortic arch: Common origin of the innominate and left common carotid arteries. The visualized aortic arch is unremarkable. No innominate or proximal subclavian artery stenosis. Right carotid system: CCA and ICA patent within the neck without stenosis. Left carotid system: CCA and ICA patent within the neck without stenosis. Vertebral arteries: The right vertebral artery is dominant. The vertebral arteries are patent within the neck bilaterally without stenosis. Skeleton: No acute bony abnormality or aggressive osseous lesion identified. Cervical spondylosis without high-grade bony spinal canal narrowing. Other neck: No neck mass or cervical lymphadenopathy. Upper chest: No consolidation within the imaged lung apices. Review of the MIP images confirms the above findings CTA HEAD FINDINGS Anterior circulation: The intracranial internal carotid arteries are patent without stenosis. The M1 middle cerebral arteries are patent without significant stenosis. No M2 proximal branch occlusion or high-grade proximal stenosis is identified.  The anterior cerebral arteries are patent without high-grade proximal stenosis. No intracranial aneurysm is identified. Posterior circulation: The dominant intracranial right vertebral artery is patent without stenosis and supplies the basilar artery. The non dominant intracranial left vertebral artery is markedly diminutive beyond the origin of the left PICA, although faintly patent. The basilar artery is patent without significant stenosis. Predominantly fetal origin of the right posterior cerebral artery which is patent without significant proximal stenosis. The left posterior cerebral artery is patent without significant proximal stenosis. A small left posterior communicating artery is present. Venous sinuses: Within limitations of contrast timing, no convincing thrombus. Anatomic variants: As described Review of the MIP images confirms the above findings IMPRESSION: CTA neck: The bilateral common carotid, internal carotid and vertebral arteries are patent within the neck without stenosis. The right vertebral artery is dominant. CTA head: 1. No intracranial large vessel occlusion. 2. The intracranial left vertebral artery is markedly diminutive beyond the origin of the left PICA. This is suspected largely on a developmental basis given significant non dominance. A significant V4 left vertebral artery stenosis cannot be definitively excluded. The dominant intracranial right vertebral artery is widely patent and supplies the basilar artery. 3. Otherwise, no evidence of proximal high-grade arterial stenosis. Electronically Signed   By: KKellie SimmeringDO   On: 07/29/2019 15:23   CT HEAD WO CONTRAST  Result Date: 07/29/2019 CLINICAL DATA:  Encephalopathy. EXAM: CT HEAD WITHOUT CONTRAST TECHNIQUE: Contiguous axial images were obtained from the base of the skull through the vertex without intravenous contrast. COMPARISON:  Head CT 03/07/2015 FINDINGS: Brain: There is no evidence of acute intracranial hemorrhage,  intracranial mass, midline shift or extra-axial fluid collection.No demarcated cortical infarction. Moderate ill-defined hypoattenuation within the cerebral white matter is similar to prior examination 03/07/2015 and nonspecific, but consistent with chronic small vessel ischemic disease. Moderate generalized parenchymal atrophy has progressed. Vascular: No hyperdense vessel.  Atherosclerotic calcifications. Skull: Normal. Negative for fracture or  focal lesion. Sinuses/Orbits: Visualized orbits demonstrate no acute abnormality. No significant paranasal sinus disease or mastoid effusion at the imaged levels. Other: Partially visualized support tubes. IMPRESSION: No evidence of acute intracranial abnormality. Moderate chronic small vessel ischemic disease, stable as compared to head CT 03/07/2015. Moderate generalized parenchymal atrophy has progressed. Electronically Signed   By: Kellie Simmering DO   On: 07/29/2019 14:16   CT Angio Neck W and/or Wo Contrast  Result Date: 07/29/2019 CLINICAL DATA:  Encephalopathy. Additional history provided: Unresponsive episode, at renal respirations, CPR, seizure EN route EXAM: CT ANGIOGRAPHY HEAD AND NECK TECHNIQUE: Multidetector CT imaging of the head and neck was performed using the standard protocol during bolus administration of intravenous contrast. Multiplanar CT image reconstructions and MIPs were obtained to evaluate the vascular anatomy. Carotid stenosis measurements (when applicable) are obtained utilizing NASCET criteria, using the distal internal carotid diameter as the denominator. CONTRAST:  54m OMNIPAQUE IOHEXOL 350 MG/ML SOLN COMPARISON:  Noncontrast head CT 07/29/2019 FINDINGS: CTA NECK FINDINGS The examination is mildly motion degraded. Aortic arch: Common origin of the innominate and left common carotid arteries. The visualized aortic arch is unremarkable. No innominate or proximal subclavian artery stenosis. Right carotid system: CCA and ICA patent within the  neck without stenosis. Left carotid system: CCA and ICA patent within the neck without stenosis. Vertebral arteries: The right vertebral artery is dominant. The vertebral arteries are patent within the neck bilaterally without stenosis. Skeleton: No acute bony abnormality or aggressive osseous lesion identified. Cervical spondylosis without high-grade bony spinal canal narrowing. Other neck: No neck mass or cervical lymphadenopathy. Upper chest: No consolidation within the imaged lung apices. Review of the MIP images confirms the above findings CTA HEAD FINDINGS Anterior circulation: The intracranial internal carotid arteries are patent without stenosis. The M1 middle cerebral arteries are patent without significant stenosis. No M2 proximal branch occlusion or high-grade proximal stenosis is identified. The anterior cerebral arteries are patent without high-grade proximal stenosis. No intracranial aneurysm is identified. Posterior circulation: The dominant intracranial right vertebral artery is patent without stenosis and supplies the basilar artery. The non dominant intracranial left vertebral artery is markedly diminutive beyond the origin of the left PICA, although faintly patent. The basilar artery is patent without significant stenosis. Predominantly fetal origin of the right posterior cerebral artery which is patent without significant proximal stenosis. The left posterior cerebral artery is patent without significant proximal stenosis. A small left posterior communicating artery is present. Venous sinuses: Within limitations of contrast timing, no convincing thrombus. Anatomic variants: As described Review of the MIP images confirms the above findings IMPRESSION: CTA neck: The bilateral common carotid, internal carotid and vertebral arteries are patent within the neck without stenosis. The right vertebral artery is dominant. CTA head: 1. No intracranial large vessel occlusion. 2. The intracranial left  vertebral artery is markedly diminutive beyond the origin of the left PICA. This is suspected largely on a developmental basis given significant non dominance. A significant V4 left vertebral artery stenosis cannot be definitively excluded. The dominant intracranial right vertebral artery is widely patent and supplies the basilar artery. 3. Otherwise, no evidence of proximal high-grade arterial stenosis. Electronically Signed   By: KKellie SimmeringDO   On: 07/29/2019 15:23   CT Chest Wo Contrast  Result Date: 07/15/2019 CLINICAL DATA:  Chronic dyspnea. EXAM: CT CHEST WITHOUT CONTRAST TECHNIQUE: Multidetector CT imaging of the chest was performed following the standard protocol without IV contrast. COMPARISON:  September 09, 2014. FINDINGS: Cardiovascular: No significant vascular  findings. Normal heart size. No pericardial effusion. Mediastinum/Nodes: No enlarged mediastinal or axillary lymph nodes. Thyroid gland, trachea, and esophagus demonstrate no significant findings. Lungs/Pleura: No pneumothorax or pleural effusion is noted. Left lung is clear. Mild right posterior basilar subsegmental atelectasis is noted. Upper Abdomen: Cholelithiasis is noted. Musculoskeletal: No chest wall mass or suspicious bone lesions identified. IMPRESSION: 1. Mild right posterior basilar subsegmental atelectasis. 2. Cholelithiasis. Electronically Signed   By: Marijo Conception M.D.   On: 07/15/2019 13:38   MR BRAIN WO CONTRAST  Result Date: 07/29/2019 CLINICAL DATA:  Seizure with subsequent mental status changes. EXAM: MRI HEAD WITHOUT CONTRAST TECHNIQUE: Multiplanar, multiecho pulse sequences of the brain and surrounding structures were obtained without intravenous contrast. COMPARISON:  CT studies earlier same day. FINDINGS: Brain: Diffusion imaging does not show any acute or subacute infarction. There is generalized brain atrophy. No focal abnormality affects the brainstem or cerebellum. The cerebral hemispheres show chronic  small-vessel ischemic changes of the white matter. No large vessel territory infarction. No mass lesion, hemorrhage, hydrocephalus or extra-axial collection. No focal mesial temporal lesion. There is considerable temporal lobe atrophy. Vascular: Major vessels at the base of the brain show flow. Skull and upper cervical spine: Negative Sinuses/Orbits: Clear/normal Other: None IMPRESSION: No acute or reversible finding. Generalized brain atrophy, with some temporal lobe predominance. Chronic small-vessel ischemic changes of the cerebral hemispheric white matter. Electronically Signed   By: Nelson Chimes M.D.   On: 07/29/2019 18:13   DG Chest Port 1 View  Result Date: 07/30/2019 CLINICAL DATA:  Shortness of breath, COVID symptoms EXAM: PORTABLE CHEST 1 VIEW COMPARISON:  07/29/2019 FINDINGS: The heart size and mediastinal contours are within normal limits. Both lungs are clear. Callused fracture deformities of the left fifth and sixth ribs again noted. Acute fractures are not appreciated. IMPRESSION: No acute abnormality of the lungs in AP portable projection. Electronically Signed   By: Eddie Candle M.D.   On: 07/30/2019 08:39   DG Chest Port 1 View  Result Date: 07/29/2019 CLINICAL DATA:  Altered level of consciousness, CPR EXAM: PORTABLE CHEST 1 VIEW COMPARISON:  07/29/2019 at 0211 hours FINDINGS: The heart size and mediastinal contours are within normal limits. Linear right basilar atelectasis. Lungs otherwise clear. Probable nondisplaced rib fractures involving the lateral aspects of the left third, fourth, and fifth ribs. No pneumothorax. IMPRESSION: Probable nondisplaced rib fractures involving the lateral aspects of the left third, fourth, and fifth ribs. No pneumothorax. Electronically Signed   By: Davina Poke D.O.   On: 07/29/2019 14:03   DG Chest Port 1 View  Result Date: 07/29/2019 CLINICAL DATA:  85 year old male with shortness of breath. EXAM: PORTABLE CHEST 1 VIEW COMPARISON:  Chest  radiograph dated 07/15/2019. FINDINGS: No focal consolidation, pleural effusion or pneumothorax. Right lung base atelectasis/scarring. The cardiac silhouette is within normal limits. Osteopenia with degenerative changes of the spine. No acute osseous pathology. IMPRESSION: No acute cardiopulmonary process. Electronically Signed   By: Anner Crete M.D.   On: 07/29/2019 02:37   DG Chest Port 1 View  Result Date: 07/15/2019 CLINICAL DATA:  Increased difficulty breathing, cough and congestion for 2 weeks, history dementia, hypertension, colon cancer, former smoker EXAM: PORTABLE CHEST 1 VIEW COMPARISON:  Portable exam 1017 hours compared to 07/08/2019 FINDINGS: Normal heart size, mediastinal contours, and pulmonary vascularity. Subsegmental atelectasis at RIGHT base unchanged. Remaining lungs clear. No pleural effusion or pneumothorax. Bones demineralized. IMPRESSION: Chronic RIGHT basilar atelectasis without acute infiltrate. Electronically Signed   By: Elta Guadel[place]  Thornton Papas M.D.   On: 07/15/2019 11:01   DG Chest Port 1 View  Result Date: 07/08/2019 CLINICAL DATA:  Shortness of breath EXAM: PORTABLE CHEST 1 VIEW COMPARISON:  11/07/2018 FINDINGS: The heart size and mediastinal contours are within normal limits. No focal airspace consolidation, pleural effusion, or pneumothorax. The visualized skeletal structures are unremarkable. IMPRESSION: No active disease. Electronically Signed   By: Davina Poke D.O.   On: 07/08/2019 10:39   DG Abd Portable 1V  Result Date: 07/30/2019 CLINICAL DATA:  Altered mental status. Short of breath. Constipation. EXAM: PORTABLE ABDOMEN - 1 VIEW COMPARISON:  CT, 06/08/2018 FINDINGS: Normal bowel gas pattern.  No significant increase colonic stool. No evidence of renal ureteral stones. Soft tissues are unremarkable. Stable right hip hemiarthroplasty.  No acute skeletal abnormality. IMPRESSION: 1. No acute findings.  No evidence of bowel obstruction. 2. No significant increase colonic  stool burden. Electronically Signed   By: Lajean Manes M.D.   On: 07/30/2019 09:24

## 2019-07-30 NOTE — ED Notes (Signed)
Respiratory called and asked to get arterial blood per order for labs. Only able to get enough for 1x Abo/RH and 1x lactic. Both sent to lab. Waiting results. Was not able to draw enough blood for other labs.

## 2019-07-30 NOTE — Progress Notes (Signed)
Subjective: COVID +, no further seizures noted  Exam: Vitals:   07/30/19 0930 07/30/19 0935  BP: (!) 166/119 132/90  Pulse:  96  Resp: (!) 26 10  Temp:  97.9 F (36.6 C)  SpO2:  98%   Gen: In bed, NAD Resp: non-labored breathing, no acute distress Abd: soft, nt  Neuro: MS: actively resists eye opening, does nto follow commands.  CN: Pupils equal round and reactive, avoids light with eyes Motor: Moves all extremities to noxious stimulation Sensory: As above  Pertinent Labs: Covid positive  Impression: 85 year old with seizures presenting as status epilepticus in the setting of Covid and dementia.  He has had seizures previously in the setting of alcohol withdrawal, but unclear if he has had an unprovoked seizure previously.  His mental status continues to be quite poor, and therefore I would prefer to rule out intermittent subclinical seizures with continuous EEG.  If negative overnight, then could discontinue monitoring.  I suspect that his mental status is rather simply a result of decompensation in the setting of dementia with Covid, but there could be some contribution from his respiratory arrest as well.  Recommendations: 1) overnight EEG 2) Continue Keppra while not reliably taking p.o., can change back to valproate once reliably taking p.o.(there is an IV valproate shortage)  Roland Rack, MD [facility] [phone]  If 7pm- 7am, please page neurology on call as listed in [place].

## 2019-07-30 NOTE — Progress Notes (Signed)
  Echocardiogram 2D Echocardiogram has been performed.  [name] 07/30/2019, 2:55 PM

## 2019-07-30 NOTE — Progress Notes (Signed)
Occupational Therapy Evaluation Patient Details Name: [name] MRN: [id number] DOB: January 24, 1935 Today's Date: 07/30/2019    History of Present Illness [name] is a 85 y.o. male with medical history significant of remote history of alcohol dependence, hypertension, seizure, dementia, presented with unresponsiveness, he lives at a memory care unit and was noted to be less responsive for 24 to 48 hours prior to hospital visit.  In the ER he had a visualized tonic-clonic seizure, he was also found to have COVID-19 pneumonia along with a suspicion of aspiration pneumonia.  He was seen by neurology and admitted for further care.   Clinical Impression   Pt's PLOF obtained from caregiver at pt's LTC facility. PTA, pt was ambulating without AD, able to answer yes/no questions. Pt was able to feed self and complete simple UB ADLs without assistance. Pt limited assist for dressing, requiring some assistance for correctly donning clean clothing. Pt experienced incontinence episodes, but able to go to bathroom too. Presently, pt is Total A for all ADLs and bed mobility to reposition due to left lateral lean. Pt did not open eyes or respond to therapist's multimodal cues. Pt unable to squeeze therapist's hands. With repositioning, pt did demonstrate facial expression and grimacing. Pt also noted with tightness in B UE at elbows and shoulders, unsure of etiology. OT evaluation cut short secondary to arrival of tech for setup of continuous EEG. Recommend return back to SNF for rehab in hopes to return to PLOF. Will continue to follow acutely.     Follow Up Recommendations  SNF;Supervision/Assistance - 24 hour    Equipment Recommendations  Other (comment)(TBD)    Recommendations for Other Services PT consult     Precautions / Restrictions Precautions Precautions: Fall;Other (comment)(Airborne) Restrictions Weight Bearing Restrictions: No      Mobility Bed Mobility Overal bed mobility: Needs  Assistance             General bed mobility comments: Total A to reposition. Some facial expression/grimacing to repositioning  Transfers Overall transfer level: Needs assistance               General transfer comment: Unable     Balance                                           ADL either performed or assessed with clinical judgement   ADL Overall ADL's : Needs assistance/impaired Eating/Feeding: Total assistance;Bed level   Grooming: Total assistance   Upper Body Bathing: Total assistance   Lower Body Bathing: Total assistance   Upper Body Dressing : Total assistance   Lower Body Dressing: Total assistance   Toilet Transfer: Total assistance   Toileting- Clothing Manipulation and Hygiene: Total assistance       Functional mobility during ADLs: Total assistance General ADL Comments: Pt Total A for all activities at this time      Vision         Perception     Praxis      Pertinent Vitals/Pain Pain Assessment: Faces Faces Pain Scale: No hurt     Hand Dominance (Unknown)   Extremity/Trunk Assessment Upper Extremity Assessment Upper Extremity Assessment: Generalized weakness;RUE deficits/detail;LUE deficits/detail RUE Deficits / Details: Stiffness in elbow, shoulder LUE Deficits / Details: Stiffness in elbow and shoulder    Lower Extremity Assessment Lower Extremity Assessment: Defer to PT evaluation       Communication  Communication Communication: Other (comment)(Did not respond to multimodal cues or stimulation )   Cognition Arousal/Alertness: Lethargic Behavior During Therapy: Flat affect Overall Cognitive Status: Impaired/Different from baseline Area of Impairment: Following commands;Safety/judgement;Awareness                       Following Commands: (does not follow commands)       General Comments: Pt with no verbalizations, responses to therapist cues    General Comments  Pt with left lateral  leanq    Exercises     Shoulder Instructions      Home Living Family/patient expects to be discharged to:: Skilled nursing facility(memory care)                                        Prior Functioning/Environment Level of Independence: Needs assistance  Gait / Transfers Assistance Needed: Per staff at facility, pt was able to ambulate without AD ADL's / Homemaking Assistance Needed: Limited assist with ADLs. Could feed self and do UB ADLs. Could dress self, but sometimes incorrectly.  Communication / Swallowing Assistance Needed: Staff at facility reports pt able to answer yes/no questions typically           OT Problem List: Decreased strength;Decreased range of motion;Decreased activity tolerance;Impaired balance (sitting and/or standing);Decreased coordination;Decreased cognition;Decreased safety awareness;Decreased knowledge of use of DME or AE;Decreased knowledge of precautions;Impaired UE functional use      OT Treatment/Interventions: Self-care/ADL training;Therapeutic exercise;Energy conservation;Therapeutic activities;Patient/family education    OT Goals(Current goals can be found in the care plan section) Acute Rehab OT Goals Patient Stated Goal: unable OT Goal Formulation: Patient unable to participate in goal setting Time For Goal Achievement: 08/13/19 Potential to Achieve Goals: Fair(Guarded ) ADL Goals Pt Will Perform Eating: with min assist;bed level Pt Will Perform Grooming: with min assist;bed level Pt Will Perform Upper Body Bathing: with mod assist;bed level Additional ADL Goal #1: Pt to demonstrate ability to sit EOB with Mod A for > 60 seconds in order to prepare for ADL transfers Additional ADL Goal #2: Pt to demonstrate ability to follow one-step commands during ADLs at least 50% of the time.  OT Frequency: Min 2X/week   Barriers to D/C:            Co-evaluation              AM-PAC OT "6 Clicks" Daily Activity     Outcome  Measure Help from another person eating meals?: Total Help from another person taking care of personal grooming?: Total Help from another person toileting, which includes using toliet, bedpan, or urinal?: Total Help from another person bathing (including washing, rinsing, drying)?: Total Help from another person to put on and taking off regular upper body clothing?: Total Help from another person to put on and taking off regular lower body clothing?: Total 6 Click Score: 6   End of Session Equipment Utilized During Treatment: Other (comment)(none) Nurse Communication: Mobility status;Other (comment)(PLOF)  Activity Tolerance: Patient limited by fatigue;Patient limited by lethargy Patient left: in bed;with call bell/phone within reach  OT Visit Diagnosis: Unsteadiness on feet (R26.81);Other abnormalities of gait and mobility (R26.89);Muscle weakness (generalized) (M62.81);Feeding difficulties (R63.3)                Time: [id number] OT Time Calculation (min): 11 min Charges:  OT General Charges $OT Visit: 1 Visit OT Evaluation $OT Eval Moderate  Complexity: [place], OTR/L  [name] 07/30/2019, 4:31 PM

## 2019-07-31 LAB — COMPREHENSIVE METABOLIC PANEL
ALT: 24 U/L (ref 0–44)
AST: 39 U/L (ref 15–41)
Albumin: 2.1 g/dL — ABNORMAL LOW (ref 3.5–5.0)
Alkaline Phosphatase: 38 U/L (ref 38–126)
Anion gap: 12 (ref 5–15)
BUN: 31 mg/dL — ABNORMAL HIGH (ref 8–23)
CO2: 26 mmol/L (ref 22–32)
Calcium: 8.2 mg/dL — ABNORMAL LOW (ref 8.9–10.3)
Chloride: 106 mmol/L (ref 98–111)
Creatinine, Ser: 1.32 mg/dL — ABNORMAL HIGH (ref 0.61–1.24)
GFR calc Af Amer: 57 mL/min — ABNORMAL LOW (ref >60)
GFR calc non Af Amer: 50 mL/min — ABNORMAL LOW (ref >60)
Glucose, Bld: 142 mg/dL — ABNORMAL HIGH (ref 70–99)
Potassium: 3.9 mmol/L (ref 3.5–5.1)
Sodium: 144 mmol/L (ref 135–145)
Total Bilirubin: 1.1 mg/dL (ref 0.3–1.2)
Total Protein: 5.5 g/dL — ABNORMAL LOW (ref 6.5–8.1)

## 2019-07-31 LAB — CBC WITH DIFFERENTIAL/PLATELET
Abs Immature Granulocytes: 0.07 10*3/uL (ref 0.00–0.07)
Basophils Absolute: 0 10*3/uL (ref 0.0–0.1)
Basophils Relative: 0 %
Eosinophils Absolute: 0 10*3/uL (ref 0.0–0.5)
Eosinophils Relative: 0 %
HCT: 36.2 % — ABNORMAL LOW (ref 39.0–52.0)
Hemoglobin: 11.5 g/dL — ABNORMAL LOW (ref 13.0–17.0)
Immature Granulocytes: 1 %
Lymphocytes Relative: 6 %
Lymphs Abs: 0.9 10*3/uL (ref 0.7–4.0)
MCH: 30.3 pg (ref 26.0–34.0)
MCHC: 31.8 g/dL (ref 30.0–36.0)
MCV: 95.5 fL (ref 80.0–100.0)
Monocytes Absolute: 0.8 10*3/uL (ref 0.1–1.0)
Monocytes Relative: 5 %
Neutro Abs: 13.3 10*3/uL — ABNORMAL HIGH (ref 1.7–7.7)
Neutrophils Relative %: 88 %
Platelets: 220 10*3/uL (ref 150–400)
RBC: 3.79 MIL/uL — ABNORMAL LOW (ref 4.22–5.81)
RDW: 12.3 % (ref 11.5–15.5)
WBC: 15 10*3/uL — ABNORMAL HIGH (ref 4.0–10.5)
nRBC: 0.3 % — ABNORMAL HIGH (ref 0.0–0.2)

## 2019-07-31 LAB — C-REACTIVE PROTEIN: CRP: 5.4 mg/dL — ABNORMAL HIGH (ref <1.0)

## 2019-07-31 LAB — MAGNESIUM: Magnesium: 1.5 mg/dL — ABNORMAL LOW (ref 1.7–2.4)

## 2019-07-31 LAB — GLUCOSE, CAPILLARY
Glucose-Capillary: 142 mg/dL — ABNORMAL HIGH (ref 70–99)
Glucose-Capillary: 136 mg/dL — ABNORMAL HIGH (ref 70–99)
Glucose-Capillary: 105 mg/dL — ABNORMAL HIGH (ref 70–99)
Glucose-Capillary: 111 mg/dL — ABNORMAL HIGH (ref 70–99)

## 2019-07-31 LAB — D-DIMER, QUANTITATIVE: D-Dimer, Quant: 1.43 ug/mL-FEU — ABNORMAL HIGH (ref 0.00–0.50)

## 2019-07-31 LAB — BRAIN NATRIURETIC PEPTIDE: B Natriuretic Peptide: 39.8 pg/mL (ref 0.0–100.0)

## 2019-07-31 MED ORDER — MAGNESIUM SULFATE 2 GM/50ML IV SOLN
2.0000 g | Freq: Once | INTRAVENOUS | Status: AC
  Administered 2019-07-31: 2 g via INTRAVENOUS
  Filled 2019-07-31: qty 50

## 2019-07-31 MED ORDER — LACTATED RINGERS IV BOLUS
1000.0000 mL | Freq: Once | INTRAVENOUS | Status: AC
  Administered 2019-07-31: 1000 mL via INTRAVENOUS

## 2019-07-31 MED ORDER — HEPARIN SODIUM (PORCINE) 5000 UNIT/ML IJ SOLN
5000.0000 [IU] | Freq: Three times a day (TID) | INTRAMUSCULAR | Status: DC
  Administered 2019-07-31 – 2019-08-03 (×11): 5000 [IU] via SUBCUTANEOUS
  Filled 2019-07-31 (×11): qty 1

## 2019-07-31 MED ORDER — HEPARIN SODIUM (PORCINE) 5000 UNIT/ML IJ SOLN: 5000.0000 [IU] | Freq: Three times a day (TID) | INTRAMUSCULAR | Status: DC

## 2019-07-31 NOTE — Evaluation (Signed)
Physical Therapy Evaluation Patient Details Name: [name] MRN: [id number] DOB: 06/13/1934 Today's Date: 07/31/2019   History of Present Illness  85 year old male with medical history significant of remote history of alcohol dependence, hypertension, seizure, dementia, presented with unresponsiveness. Patient lives at a memory care unit and was noted to be less responsive for 24 to 48 hours prior to hospital visit.  In the ER he had a visualized tonic-clonic seizure, he was also found to have COVID-19 pneumonia along with a suspicion of aspiration pneumonia. Neurology consulted and recommended continuous EEG and to continue Keppra and admitted for further care.    Clinical Impression  Patient more alert compared to OT evaluation yesterday. He was able to sit EOB with two assist and maintain balance sitting EOB with close supervision/ contact guard at times to ensure safety due to multiple lines/tube and continuous EEG. Partial stand with maxA for changing chuck pad on bed. PT eval time limited due to EEG technicians present to remove continuous EEG. Recommend continued skilled PT services and discharge to SNF as patient is below his PLOF of ambulatory without AD.    Follow Up Recommendations SNF    Equipment Recommendations  Other (comment)(TBD)       Precautions / Restrictions Precautions Precautions: Fall;Other (comment) Precaution Comments: impaired cognition Restrictions Weight Bearing Restrictions: No      Mobility  Bed Mobility Overal bed mobility: Needs Assistance Bed Mobility: Supine to Sit;Sit to Supine     Supine to sit: Min assist;Mod assist;+2 for physical assistance;+2 for safety/equipment;HOB elevated Sit to supine: Mod assist;+2 for physical assistance;+2 for safety/equipment      Transfers Overall transfer level: Needs assistance Equipment used: None Transfers: (sit<>partial stand)           General transfer comment: maxA x 1 and 2nd person for  management of lines/linens  Ambulation/Gait   General Gait Details: not tested due to poor standing balance and on continous EEG       Balance Overall balance assessment: Needs assistance Sitting-balance support: Feet supported;No upper extremity supported Sitting balance-Leahy Scale: Fair Sitting balance - Comments: Patient able to sit EOB with close supervision   Standing balance support: Single extremity supported Standing balance-Leahy Scale: Poor Standing balance comment: partial stand for a few seconds       Pertinent Vitals/Pain      Home Living Family/patient expects to be discharged to:: Skilled nursing facility(memory care unit)  Additional Comments: OT spoke to [employer], pt's nurse at [facility], via phone. PTA, pt was ambulatory without AD. Was able to respond to yes/no questions typically. Able to feed self, could dress self (at times incorrectly and w/ soiled clothing), and could take self to bathroom (though often incontinent).     Prior Function Level of Independence: Needs assistance   Gait / Transfers Assistance Needed: Distant supervision, memory care unit, ambulatory without AD  ADL's / Homemaking Assistance Needed: Limited assist with ADLs. Could feed self and do UB ADLs. Could dress self, but sometimes incorrectly.          Extremity/Trunk Assessment        Lower Extremity Assessment Lower Extremity Assessment: LLE deficits/detail;RLE deficits/detail RLE Deficits / Details: Grossly 2- to 3-/5, decreased ankle DF PROM LLE Deficits / Details: grossly 2- to 3-/5, decreased ankle DF PROM    Can't always understand what patient is saying, he does say his name and nod head  Communication     Cognition Arousal/Alertness: Awake/alert Behavior During Therapy: Impulsive Overall  Cognitive Status: History of cognitive impairments - at baseline Area of Impairment: Orientation;Following commands;Safety/judgement     Orientation Level: Disoriented  to;Place;Time;Situation     Following Commands: Follows one step commands with increased time;Follows one step commands inconsistently Safety/Judgement: Decreased awareness of safety;Decreased awareness of deficits     General Comments: Patient alert once PT informing him that we were here to assess his mobility. He begn to move his legs to the EOB. Alert, eyes open, says his name      General Comments General comments (skin integrity, edema, etc.): Nurse cleared patient to participate in PT evaluation. Patient on continuous EEG. On room air, oxygen saturation and HR in 90s during session.        Assessment/Plan    PT Assessment Patient needs continued PT services  PT Problem List Decreased strength;Decreased range of motion;Decreased activity tolerance;Decreased balance;Decreased mobility;Decreased cognition;Decreased knowledge of use of DME;Decreased safety awareness;Decreased knowledge of precautions       PT Treatment Interventions DME instruction;Gait training;Functional mobility training;Therapeutic activities;Therapeutic exercise;Balance training;Neuromuscular re-education;Cognitive remediation;Patient/family education    PT Goals (Current goals can be found in the Care Plan section)  Acute Rehab PT Goals Time For Goal Achievement: 08/13/19 Potential to Achieve Goals: Good    Frequency Min 2X/week           AM-PAC PT "6 Clicks" Mobility  Outcome Measure Help needed turning from your back to your side while in a flat bed without using bedrails?: A Lot Help needed moving from lying on your back to sitting on the side of a flat bed without using bedrails?: A Lot Help needed moving to and from a bed to a chair (including a wheelchair)?: A Lot Help needed standing up from a chair using your arms (e.g., wheelchair or bedside chair)?: A Lot Help needed to walk in hospital room?: Total Help needed climbing 3-5 steps with a railing? : Total 6 Click Score: 10    End of  Session   Activity Tolerance: Patient tolerated treatment well Patient left: in bed;Other (comment)(with EEG techs in room to remove EEG, nurse aware) Nurse Communication: Mobility status PT Visit Diagnosis: Unsteadiness on feet (R26.81);Other abnormalities of gait and mobility (R26.89);Muscle weakness (generalized) (M62.81)    Time: [id number] PT Time Calculation (min) (ACUTE ONLY): 15 min   Charges:   PT Evaluation $PT Eval Moderate Complexity: 1 Mod          Birdie Hopes, PT, DPT Acute Rehab [phone] office    Birdie Hopes 07/31/2019, 1:01 PM

## 2019-07-31 NOTE — TOC Initial Note (Signed)
Transition of Care [facility]) - Initial/Assessment Note    Patient Details  Name: [name] MRN: [id number] Date of Birth: 1934-11-09  [facility] CM/SW Contact:    Kirstie Peri, [place] Work Phone Number: 07/31/2019, 3:25 PM  Clinical Narrative:                 MSW Intern spoke with pt's daughter Ardelle Park, to discuss discharge planning. She confirmed that pt has been at [facility] since [facility]. She is agreeable to SNF and the daughter requested that he be faxed out to [facility], as she works there. She was advised that it may be difficult for him to get into certain places due to being Covid Positive. FL2 will be completed and patient faxed out to [facility], as they are the facilities accepting covid patients. She also noted that after SNF she wants to take her father home, and not back to [facility]. SW will follow for Discharge planning.  Expected Discharge Plan: Skilled Nursing Facility Barriers to Discharge: Continued Medical Work up, Ship broker   Patient Goals and CMS Choice Patient states their goals for this hospitalization and ongoing recovery are:: Pt's Daughter states she would like for him to go to rehab, and then she will take him home. CMS Medicare.gov Compare Post Acute Care list provided to:: Patient Represenative (must comment) Choice offered to / list presented to : Adult Children  Expected Discharge Plan and Services Expected Discharge Plan: [place] In-house Referral: Clinical Social Work   Post Acute Care Choice: [place] Living arrangements for the past 2 months: [place]                                      Prior Living Arrangements/Services Living arrangements for the past 2 months: [place] Lives with:: Facility Resident Patient language and need for interpreter reviewed:: Yes Do you feel safe going back to the place where you live?: Yes       Need for Family Participation in Patient Care: Yes (Comment) Care giver support system in place?: Yes (comment)   Criminal Activity/Legal Involvement Pertinent to Current Situation/Hospitalization: No - Comment as needed  Activities of Daily Living Home Assistive Devices/Equipment: [place] Rile (specify type) ADL Screening (condition at time of admission) Patient's cognitive ability adequate to safely complete daily activities?: No Does the patient have difficulty concentrating, remembering, or making decisions?: Yes Patient able to express need for assistance with ADLs?: No Does the patient have difficulty dressing or bathing?: Yes Independently performs ADLs?: No Communication: Needs assistance, Dependent Is this a change from baseline?: Change from baseline, expected to last >3 days Dressing (OT): Dependent Is this a change from baseline?: Change from baseline, expected to last >3 days Grooming: Dependent Is this a change from baseline?: Change from baseline, expected to last >3 days Feeding: Dependent Is this a change from baseline?: Change from baseline, expected to last >3 days Bathing: Dependent Is this a change from baseline?: Change from baseline, expected to last >3 days Toileting: Dependent Is this a change from baseline?: Change from baseline, expected to last >3days In/Out Bed: Dependent Is this a change from baseline?: Change from baseline, expected to last >3 days Walks in Home: Dependent Is this a change from baseline?: Change from baseline, expected to last >3 days Does the patient have difficulty walking or climbing stairs?: Yes Weakness of  Legs: Both Weakness of Arms/Hands: Both  Permission Sought/Granted Permission sought to share information with : Facility Sport and exercise psychologist    Share Information with NAME: [name]     Permission granted to share info w Relationship: Daughter  Permission granted to share info w Contact Information:  [phone]  Emotional Assessment Appearance:: Other (Comment Required(Unable to assess) Attitude/Demeanor/Rapport: Unable to Assess Affect (typically observed): Unable to Assess Orientation: : Oriented to Self Alcohol / Substance Use: Not Applicable Psych Involvement: No (comment)  Admission diagnosis:  Status epilepticus ([place]) [G40.901] Seizure ([place]) [R56.9] SOB (shortness of breath) [R06.02] Constipation [K59.00] Patient Active Problem List   Diagnosis Date Noted  . Seizure ([place]) 07/29/2019  . Status epilepticus ([place])   . Aspiration pneumonitis ([place]) 11/08/2018  . Aspiration pneumonia ([place]) 11/08/2018  . Dementia ([place])   . History of alcohol use disorder   . History of seizures   . Nausea and vomiting   . Rib fracture 03/16/2015  . Anemia 03/16/2015  . Benign essential HTN 12/24/2014  . 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[place]) 12/16/2014   PCP:  Sande Brothers, MD Pharmacy:   [facility] [address] [place] [address] Phone: [phone] Fax: [place], [place] [id number] [place] [address] Phone: [phone] Fax: [phone]     Social Determinants of Health (SDOH) Interventions    Readmission Risk Interventions No flowsheet data found.

## 2019-07-31 NOTE — Progress Notes (Signed)
PROGRESS NOTE                                                                                                                                                                                                             Patient Demographics:    [name], is a 85 y.o. male, DOB - 08-Apr-1935, MEB:[id number]  Outpatient Primary MD for the patient is Sande Brothers, MD    LOS - 2  Admit date - 07/29/2019    Chief Complaint  Patient presents with   Altered Mental Status       Brief Narrative   [name] is a 85 y.o. male with medical history significant of remote history of alcohol dependence, hypertension, seizure, dementia, presented with unresponsiveness, he lives at a memory care unit and was noted to be less responsive for 24 to 48 hours prior to hospital visit.  In the ER he had a visualized tonic-clonic seizure, he was also found to have COVID-19 pneumonia along with a suspicion of aspiration pneumonia.  He was seen by neurology and admitted for further care.   Subjective:    [name] today remains in bed, unable to answer questions or follow commands but is moaning from time to time   Assessment  & Plan :     1. Acute Hypoxic Resp. Failure due to Acute Covid 19 Viral Pneumonitis during the ongoing 2020 Covid 19 Pandemic - he has mild to moderate disease at best, has been started on steroids and remdesivir.  Will be monitored.  Not a candidate for Actemra use.  Once more alert he will be encouraged to sit up in chair in the daytime use I-S and flutter valve for pulmonary toiletry and then prone in bed when at night.   Recent Labs  Lab 07/29/19 1630 07/30/19 1046 07/31/19 0610  CRP  --  7.1* 5.4*  DDIMER  --  >20.00* 1.43*  BNP  --  173.3* 39.8  PROCALCITON  --  0.21  --   SARSCOV2NAA POSITIVE*  --   --     Hepatic Function Latest Ref Rng & Units 07/31/2019 07/29/2019 07/15/2019  Total Protein 6.5 - 8.1 g/dL  5.5(L) 7.5 7.8  Albumin 3.5 - 5.0 g/dL 2.1(L) 2.8(L) 3.2(L)  AST 15 - 41 U/L 39 44(H) 26  ALT 0 - 44 U/L 24 26  17  Alk Phosphatase 38 - 126 U/L 38 52 49  Total Bilirubin 0.3 - 1.2 mg/dL 1.1 1.8(H) 1.1  Bilirubin, Direct 0.0 - 0.3 mg/dL - - -      2.  Breakthrough seizures.  Question if due to his decreased mental status he was unable to take his oral antiseizure medication at home, CTA & MRI nonacute, neuro on board, as needed IV Ativan and IV Keppra for now.  Monitor closely.  3.  Possible aspiration pneumonia.  Unasyn.  Speech to evaluate once mentation improves.  Currently n.p.o.  4.  Hypertension.  Due to n.p.o. status placed on as needed hydralazine.    Patient has very poor baseline quality of life, extremely sick, discussed with daughter, plan is to continue present line of care for a short while, at any stage if it looks like he is suffering we will transition to full comfort measures.  No heroics to be done.    Condition - Extremely Guarded  Family Communication  :  Daughter [name] 07/30/19, 3/18 - [phone]  Code Status :  DNR  Diet :   Diet Order            Diet NPO time specified  Diet effective now               Disposition Plan  : Stay in the hospital finished treatment for COVID-19 pneumonia, also being treated for breakthrough seizures.  Consults  :  Neuro  Procedures  :    EEG - nonspecific encephalopathy no ongoing seizures.  MRI Brain and CTA head and Neck - no acute changes  PUD Prophylaxis :    DVT Prophylaxis  :   Heparin   Lab Results  Component Value Date   PLT 220 07/31/2019    Inpatient Medications  Scheduled Meds:  dexamethasone (DECADRON) injection  6 mg Intravenous Q24H   heparin  5,000 Units Subcutaneous Q12H   ipratropium  2 puff Inhalation Q6H   Continuous Infusions:  sodium chloride Stopped (07/30/19 0554)   ampicillin-sulbactam (UNASYN) IV 3 g (07/31/19 0837)   dextrose 5 % and 0.45 % NaCl with KCl 10 mEq/L  75 mL/hr at 07/31/19 0232   levETIRAcetam 500 mg (07/31/19 0509)   remdesivir 100 mg in NS 100 mL 100 mg (07/30/19 1026)   PRN Meds:.acetaminophen **OR** acetaminophen, guaiFENesin, guaiFENesin-dextromethorphan, hydrALAZINE, latanoprost, LORazepam, [DISCONTINUED] ondansetron **OR** ondansetron (ZOFRAN) IV  Antibiotics  :    Anti-infectives (From admission, onward)   Start     Dose/Rate Route Frequency Ordered Stop   07/30/19 1000  remdesivir 100 mg in sodium chloride 0.9 % 100 mL IVPB  Status:  Discontinued     100 mg 200 mL/hr over 30 Minutes Intravenous Daily 07/29/19 1847 07/29/19 1849   07/30/19 1000  remdesivir 100 mg in sodium chloride 0.9 % 100 mL IVPB     100 mg 200 mL/hr over 30 Minutes Intravenous Daily 07/29/19 1851 08/03/19 0959   07/30/19 0200  Ampicillin-Sulbactam (UNASYN) 3 g in sodium chloride 0.9 % 100 mL IVPB     3 g 200 mL/hr over 30 Minutes Intravenous Every 8 hours 07/29/19 1710     07/29/19 1900  remdesivir 200 mg in sodium chloride 0.9% 250 mL IVPB  Status:  Discontinued     200 mg 580 mL/hr over 30 Minutes Intravenous Once 07/29/19 1847 07/29/19 1849   07/29/19 1900  remdesivir 100 mg in sodium chloride 0.9 % 100 mL IVPB     100 mg  200 mL/hr over 30 Minutes Intravenous Every 30 min 07/29/19 1851 07/29/19 2218   07/29/19 1615  Ampicillin-Sulbactam (UNASYN) 3 g in sodium chloride 0.9 % 100 mL IVPB     3 g 200 mL/hr over 30 Minutes Intravenous  Once 07/29/19 1611 07/29/19 1928       Time Spent in minutes  30   Lala Lund M.D on 07/31/2019 at 9:56 AM  To page go to [email or web address] - password [facility]  [facility] -  Office  [phone]   See all Orders from today for further details    Objective:   Vitals:   07/31/19 0000 07/31/19 0352 07/31/19 0725 07/31/19 0800  BP: 132/84   98/60  Pulse:    68  Resp:    16  Temp:  97.6 F (36.4 C) 98.4 F (36.9 C) 98.4 F (36.9 C)  TempSrc:   Oral Oral  SpO2:    97%  Weight:      Height:         Wt Readings from Last 3 Encounters:  07/29/19 54 kg  07/08/19 54 kg  11/11/18 57.5 kg     Intake/Output Summary (Last 24 hours) at 07/31/2019 0956 Last data filed at 07/30/2019 1845 Gross per 24 hour  Intake 679.02 ml  Output --  Net 679.02 ml     Physical Exam  Awake but not alert, moving all 4 extremities by himself [place].AT,PERRAL Supple Neck,No JVD, No cervical lymphadenopathy appriciated.  Symmetrical Chest wall movement, Good air movement bilaterally, CTAB RRR,No Gallops,Rubs or new Murmurs, No Parasternal Heave +ve B.Sounds, Abd Soft, No tenderness, No organomegaly appriciated, No rebound - guarding or rigidity. No Cyanosis, Clubbing or edema, No new Rash or bruise       Data Review:    CBC Recent Labs  Lab 07/28/19 1901 07/28/19 1901 07/29/19 1400 07/29/19 1400 07/29/19 1415 07/29/19 1828 07/30/19 0546 07/30/19 1046 07/31/19 0610  WBC 9.0  --  11.7*  --   --   --  9.4 14.6* 15.0*  HGB 14.0   < > 13.6   < > 15.0 12.6* 13.1 13.8 11.5*  HCT 45.4   < > 44.8   < > 44.0 37.0* 41.6 44.3 36.2*  PLT 248  --  292  --   --   --  340 238 220  MCV 98.3  --  100.9*  --   --   --  97.2 97.1 95.5  MCH 30.3  --  30.6  --   --   --  30.6 30.3 30.3  MCHC 30.8  --  30.4  --   --   --  31.5 31.2 31.8  RDW 12.1  --  12.2  --   --   --  12.9 12.1 12.3  LYMPHSABS  --   --  3.2  --   --   --  0.8  --  0.9  MONOABS  --   --  0.9  --   --   --  0.2  --  0.8  EOSABS  --   --  0.0  --   --   --  0.0  --  0.0  BASOSABS  --   --  0.1  --   --   --  0.0  --  0.0   < > = values in this interval not displayed.    Chemistries  Recent Labs  Lab 07/28/19 1901 07/28/19 1901 07/29/19 1400 07/29/19 1415 07/29/19 1828 07/30/19 1046  07/31/19 0610  NA 137   < > 139 138 140 144 144  K 4.4   < > 4.3 5.1 4.0 4.2 3.9  CL 96*  --  99  --   --  101 106  CO2 27  --  19*  --   --  25 26  GLUCOSE 95  --  129*  --   --  104* 142*  BUN 18  --  19  --   --  19 31*  CREATININE 1.07  --   1.21  --   --  1.03 1.32*  CALCIUM 9.3  --  9.0  --   --  9.0 8.2*  AST  --   --  44*  --   --   --  39  ALT  --   --  26  --   --   --  24  ALKPHOS  --   --  52  --   --   --  38  BILITOT  --   --  1.8*  --   --   --  1.1  MG  --   --   --   --   --  1.7 1.5*  AMMONIA  --   --   --  29  --   --   --    < > = values in this interval not displayed.     ------------------------------------------------------------------------------------------------------------------ No results for input(s): CHOL, HDL, LDLCALC, TRIG, CHOLHDL, LDLDIRECT in the last 72 hours.  Lab Results  Component Value Date   HGBA1C 5.1 12/14/2014   ------------------------------------------------------------------------------------------------------------------ No results for input(s): TSH, T4TOTAL, T3FREE, THYROIDAB in the last 72 hours.  Invalid input(s): FREET3  Cardiac Enzymes No results for input(s): CKMB, TROPONINI, MYOGLOBIN in the last 168 hours.  Invalid input(s): CK ------------------------------------------------------------------------------------------------------------------    Component Value Date/Time   BNP 39.8 07/31/2019 0610    Micro Results Recent Results (from the past 240 hour(s))  Urine culture     Status: Abnormal   Collection Time: 07/29/19  6:09 AM   Specimen: Urine, Random  Result Value Ref Range Status   Specimen Description URINE, RANDOM  Final   Special Requests   Final    NONE Performed at [facility], [address], [place], [place] [address]    Culture MULTIPLE SPECIES PRESENT, SUGGEST RECOLLECTION (A)  Final   Report Status 07/30/2019 FINAL  Final  Respiratory Panel by RT PCR (Flu A&B, Covid) - Nasopharyngeal Swab     Status: Abnormal   Collection Time: 07/29/19  4:30 PM   Specimen: Nasopharyngeal Swab  Result Value Ref Range Status   SARS Coronavirus 2 by RT PCR POSITIVE (A) NEGATIVE Final    Comment: RESULT CALLED TO, READ BACK BY AND VERIFIED WITHRhona Leavens  RN 1817 07/29/19 A BROWNING (NOTE) SARS-CoV-2 target nucleic acids are DETECTED. SARS-CoV-2 RNA is generally detectable in upper respiratory specimens  during the acute phase of infection. Positive results are indicative of the presence of the identified virus, but do not rule out bacterial infection or co-infection with other pathogens not detected by the test. Clinical correlation with patient history and other diagnostic information is necessary to determine patient infection status. The expected result is Negative. Fact Sheet for Patients:  PinkCheek.be Fact Sheet for Healthcare Providers: GravelBags.it This test is not yet approved or cleared by the [place] FDA and  has been authorized for detection and/or diagnosis of SARS-CoV-2 by FDA  under an Emergency Use Authorization (EUA).  This EUA will remain in effect (meaning this test can be used) for  the duration of  the COVID-19 declaration under Section 564(b)(1) of the Act, 21 U.S.C. section 360bbb-3(b)(1), unless the authorization is terminated or revoked sooner.    Influenza A by PCR NEGATIVE NEGATIVE Final   Influenza B by PCR NEGATIVE NEGATIVE Final    Comment: (NOTE) The Xpert Xpress SARS-CoV-2/FLU/RSV assay is intended as an aid in  the diagnosis of influenza from Nasopharyngeal swab specimens and  should not be used as a sole basis for treatment. Nasal washings and  aspirates are unacceptable for Xpert Xpress SARS-CoV-2/FLU/RSV  testing. Fact Sheet for Patients: PinkCheek.be Fact Sheet for Healthcare Providers: GravelBags.it This test is not yet approved or cleared by the [place] FDA and  has been authorized for detection and/or diagnosis of SARS-CoV-2 by  FDA under an Emergency Use Authorization (EUA). This EUA will remain  in effect (meaning this test can be used) for the duration of the   Covid-19 declaration under Section 564(b)(1) of the Act, 21  U.S.C. section 360bbb-3(b)(1), unless the authorization is  terminated or revoked. Performed at [facility], [place] [address], [place], [place] [address]     Radiology Reports EEG  Result Date: 07/29/2019 Lora Havens, MD     07/29/2019  4:15 PM Patient Name: [name] MRN: [id number] Epilepsy Attending: Lora Havens Referring Physician/Provider: Etta Quill, PA Date: 07/29/2019 Duration: 26.07 minutes Patient history: 85 year old male with history of alcohol withdrawal seizures, dementia who presented after seizure-like episode.  EEG to evaluate for seizures. Level of alertness: Lethargic AEDs during EEG study: Ativan, Depakote Technical aspects: This EEG study was done with scalp electrodes positioned according to the 10-20 International system of electrode placement. Electrical activity was acquired at a sampling rate of '500Hz'  and reviewed with a high frequency filter of '70Hz'  and a low frequency filter of '1Hz' . EEG data were recorded continuously and digitally stored. Description: No clear posterior dominant was seen.  EEG showed continuous generalized low amplitude 2 to 3 Hz delta slowing.  Hyperventilation and photic stimulation were not performed.  Of note, EEG was technically difficult secondary to significant myogenic artifact. Abnormality - Continuous slow, generalized IMPRESSION: This technically difficult study is suggestive of moderate to severe diffuse encephalopathy, nonspecific to etiology. No seizures or epileptiform discharges were seen throughout the recording. Lora Havens   CT Angio Head W or Wo Contrast  Result Date: 07/29/2019 CLINICAL DATA:  Encephalopathy. Additional history provided: Unresponsive episode, at renal respirations, CPR, seizure EN route EXAM: CT ANGIOGRAPHY HEAD AND NECK TECHNIQUE: Multidetector CT imaging of the head and neck was performed using the standard protocol during bolus  administration of intravenous contrast. Multiplanar CT image reconstructions and MIPs were obtained to evaluate the vascular anatomy. Carotid stenosis measurements (when applicable) are obtained utilizing NASCET criteria, using the distal internal carotid diameter as the denominator. CONTRAST:  7m OMNIPAQUE IOHEXOL 350 MG/ML SOLN COMPARISON:  Noncontrast head CT 07/29/2019 FINDINGS: CTA NECK FINDINGS The examination is mildly motion degraded. Aortic arch: Common origin of the innominate and left common carotid arteries. The visualized aortic arch is unremarkable. No innominate or proximal subclavian artery stenosis. Right carotid system: CCA and ICA patent within the neck without stenosis. Left carotid system: CCA and ICA patent within the neck without stenosis. Vertebral arteries: The right vertebral artery is dominant. The vertebral arteries are patent within the neck bilaterally without stenosis. Skeleton: No  acute bony abnormality or aggressive osseous lesion identified. Cervical spondylosis without high-grade bony spinal canal narrowing. Other neck: No neck mass or cervical lymphadenopathy. Upper chest: No consolidation within the imaged lung apices. Review of the MIP images confirms the above findings CTA HEAD FINDINGS Anterior circulation: The intracranial internal carotid arteries are patent without stenosis. The M1 middle cerebral arteries are patent without significant stenosis. No M2 proximal branch occlusion or high-grade proximal stenosis is identified. The anterior cerebral arteries are patent without high-grade proximal stenosis. No intracranial aneurysm is identified. Posterior circulation: The dominant intracranial right vertebral artery is patent without stenosis and supplies the basilar artery. The non dominant intracranial left vertebral artery is markedly diminutive beyond the origin of the left PICA, although faintly patent. The basilar artery is patent without significant stenosis.  Predominantly fetal origin of the right posterior cerebral artery which is patent without significant proximal stenosis. The left posterior cerebral artery is patent without significant proximal stenosis. A small left posterior communicating artery is present. Venous sinuses: Within limitations of contrast timing, no convincing thrombus. Anatomic variants: As described Review of the MIP images confirms the above findings IMPRESSION: CTA neck: The bilateral common carotid, internal carotid and vertebral arteries are patent within the neck without stenosis. The right vertebral artery is dominant. CTA head: 1. No intracranial large vessel occlusion. 2. The intracranial left vertebral artery is markedly diminutive beyond the origin of the left PICA. This is suspected largely on a developmental basis given significant non dominance. A significant V4 left vertebral artery stenosis cannot be definitively excluded. The dominant intracranial right vertebral artery is widely patent and supplies the basilar artery. 3. Otherwise, no evidence of proximal high-grade arterial stenosis. Electronically Signed   By: Kellie Simmering DO   On: 07/29/2019 15:23   CT HEAD WO CONTRAST  Result Date: 07/29/2019 CLINICAL DATA:  Encephalopathy. EXAM: CT HEAD WITHOUT CONTRAST TECHNIQUE: Contiguous axial images were obtained from the base of the skull through the vertex without intravenous contrast. COMPARISON:  Head CT 03/07/2015 FINDINGS: Brain: There is no evidence of acute intracranial hemorrhage, intracranial mass, midline shift or extra-axial fluid collection.No demarcated cortical infarction. Moderate ill-defined hypoattenuation within the cerebral white matter is similar to prior examination 03/07/2015 and nonspecific, but consistent with chronic small vessel ischemic disease. Moderate generalized parenchymal atrophy has progressed. Vascular: No hyperdense vessel.  Atherosclerotic calcifications. Skull: Normal. Negative for fracture or  focal lesion. Sinuses/Orbits: Visualized orbits demonstrate no acute abnormality. No significant paranasal sinus disease or mastoid effusion at the imaged levels. Other: Partially visualized support tubes. IMPRESSION: No evidence of acute intracranial abnormality. Moderate chronic small vessel ischemic disease, stable as compared to head CT 03/07/2015. Moderate generalized parenchymal atrophy has progressed. Electronically Signed   By: Kellie Simmering DO   On: 07/29/2019 14:16   CT Angio Neck W and/or Wo Contrast  Result Date: 07/29/2019 CLINICAL DATA:  Encephalopathy. Additional history provided: Unresponsive episode, at renal respirations, CPR, seizure EN route EXAM: CT ANGIOGRAPHY HEAD AND NECK TECHNIQUE: Multidetector CT imaging of the head and neck was performed using the standard protocol during bolus administration of intravenous contrast. Multiplanar CT image reconstructions and MIPs were obtained to evaluate the vascular anatomy. Carotid stenosis measurements (when applicable) are obtained utilizing NASCET criteria, using the distal internal carotid diameter as the denominator. CONTRAST:  19m OMNIPAQUE IOHEXOL 350 MG/ML SOLN COMPARISON:  Noncontrast head CT 07/29/2019 FINDINGS: CTA NECK FINDINGS The examination is mildly motion degraded. Aortic arch: Common origin of the innominate and left common  carotid arteries. The visualized aortic arch is unremarkable. No innominate or proximal subclavian artery stenosis. Right carotid system: CCA and ICA patent within the neck without stenosis. Left carotid system: CCA and ICA patent within the neck without stenosis. Vertebral arteries: The right vertebral artery is dominant. The vertebral arteries are patent within the neck bilaterally without stenosis. Skeleton: No acute bony abnormality or aggressive osseous lesion identified. Cervical spondylosis without high-grade bony spinal canal narrowing. Other neck: No neck mass or cervical lymphadenopathy. Upper chest:  No consolidation within the imaged lung apices. Review of the MIP images confirms the above findings CTA HEAD FINDINGS Anterior circulation: The intracranial internal carotid arteries are patent without stenosis. The M1 middle cerebral arteries are patent without significant stenosis. No M2 proximal branch occlusion or high-grade proximal stenosis is identified. The anterior cerebral arteries are patent without high-grade proximal stenosis. No intracranial aneurysm is identified. Posterior circulation: The dominant intracranial right vertebral artery is patent without stenosis and supplies the basilar artery. The non dominant intracranial left vertebral artery is markedly diminutive beyond the origin of the left PICA, although faintly patent. The basilar artery is patent without significant stenosis. Predominantly fetal origin of the right posterior cerebral artery which is patent without significant proximal stenosis. The left posterior cerebral artery is patent without significant proximal stenosis. A small left posterior communicating artery is present. Venous sinuses: Within limitations of contrast timing, no convincing thrombus. Anatomic variants: As described Review of the MIP images confirms the above findings IMPRESSION: CTA neck: The bilateral common carotid, internal carotid and vertebral arteries are patent within the neck without stenosis. The right vertebral artery is dominant. CTA head: 1. No intracranial large vessel occlusion. 2. The intracranial left vertebral artery is markedly diminutive beyond the origin of the left PICA. This is suspected largely on a developmental basis given significant non dominance. A significant V4 left vertebral artery stenosis cannot be definitively excluded. The dominant intracranial right vertebral artery is widely patent and supplies the basilar artery. 3. Otherwise, no evidence of proximal high-grade arterial stenosis. Electronically Signed   By: Kellie Simmering DO   On:  07/29/2019 15:23   CT Chest Wo Contrast  Result Date: 07/15/2019 CLINICAL DATA:  Chronic dyspnea. EXAM: CT CHEST WITHOUT CONTRAST TECHNIQUE: Multidetector CT imaging of the chest was performed following the standard protocol without IV contrast. COMPARISON:  September 09, 2014. FINDINGS: Cardiovascular: No significant vascular findings. Normal heart size. No pericardial effusion. Mediastinum/Nodes: No enlarged mediastinal or axillary lymph nodes. Thyroid gland, trachea, and esophagus demonstrate no significant findings. Lungs/Pleura: No pneumothorax or pleural effusion is noted. Left lung is clear. Mild right posterior basilar subsegmental atelectasis is noted. Upper Abdomen: Cholelithiasis is noted. Musculoskeletal: No chest wall mass or suspicious bone lesions identified. IMPRESSION: 1. Mild right posterior basilar subsegmental atelectasis. 2. Cholelithiasis. Electronically Signed   By: Marijo Conception M.D.   On: 07/15/2019 13:38   MR BRAIN WO CONTRAST  Result Date: 07/29/2019 CLINICAL DATA:  Seizure with subsequent mental status changes. EXAM: MRI HEAD WITHOUT CONTRAST TECHNIQUE: Multiplanar, multiecho pulse sequences of the brain and surrounding structures were obtained without intravenous contrast. COMPARISON:  CT studies earlier same day. FINDINGS: Brain: Diffusion imaging does not show any acute or subacute infarction. There is generalized brain atrophy. No focal abnormality affects the brainstem or cerebellum. The cerebral hemispheres show chronic small-vessel ischemic changes of the white matter. No large vessel territory infarction. No mass lesion, hemorrhage, hydrocephalus or extra-axial collection. No focal mesial temporal lesion. There is  considerable temporal lobe atrophy. Vascular: Major vessels at the base of the brain show flow. Skull and upper cervical spine: Negative Sinuses/Orbits: Clear/normal Other: None IMPRESSION: No acute or reversible finding. Generalized brain atrophy, with some  temporal lobe predominance. Chronic small-vessel ischemic changes of the cerebral hemispheric white matter. Electronically Signed   By: Nelson Chimes M.D.   On: 07/29/2019 18:13   DG Chest Port 1 View  Result Date: 07/30/2019 CLINICAL DATA:  Shortness of breath, COVID symptoms EXAM: PORTABLE CHEST 1 VIEW COMPARISON:  07/29/2019 FINDINGS: The heart size and mediastinal contours are within normal limits. Both lungs are clear. Callused fracture deformities of the left fifth and sixth ribs again noted. Acute fractures are not appreciated. IMPRESSION: No acute abnormality of the lungs in AP portable projection. Electronically Signed   By: Eddie Candle M.D.   On: 07/30/2019 08:39   DG Chest Port 1 View  Result Date: 07/29/2019 CLINICAL DATA:  Altered level of consciousness, CPR EXAM: PORTABLE CHEST 1 VIEW COMPARISON:  07/29/2019 at 0211 hours FINDINGS: The heart size and mediastinal contours are within normal limits. Linear right basilar atelectasis. Lungs otherwise clear. Probable nondisplaced rib fractures involving the lateral aspects of the left third, fourth, and fifth ribs. No pneumothorax. IMPRESSION: Probable nondisplaced rib fractures involving the lateral aspects of the left third, fourth, and fifth ribs. No pneumothorax. Electronically Signed   By: Davina Poke D.O.   On: 07/29/2019 14:03   DG Chest Port 1 View  Result Date: 07/29/2019 CLINICAL DATA:  85 year old male with shortness of breath. EXAM: PORTABLE CHEST 1 VIEW COMPARISON:  Chest radiograph dated 07/15/2019. FINDINGS: No focal consolidation, pleural effusion or pneumothorax. Right lung base atelectasis/scarring. The cardiac silhouette is within normal limits. Osteopenia with degenerative changes of the spine. No acute osseous pathology. IMPRESSION: No acute cardiopulmonary process. Electronically Signed   By: Anner Crete M.D.   On: 07/29/2019 02:37   DG Chest Port 1 View  Result Date: 07/15/2019 CLINICAL DATA:  Increased  difficulty breathing, cough and congestion for 2 weeks, history dementia, hypertension, colon cancer, former smoker EXAM: PORTABLE CHEST 1 VIEW COMPARISON:  Portable exam 1017 hours compared to 07/08/2019 FINDINGS: Normal heart size, mediastinal contours, and pulmonary vascularity. Subsegmental atelectasis at RIGHT base unchanged. Remaining lungs clear. No pleural effusion or pneumothorax. Bones demineralized. IMPRESSION: Chronic RIGHT basilar atelectasis without acute infiltrate. Electronically Signed   By: Lavonia Dana M.D.   On: 07/15/2019 11:01   DG Chest Port 1 View  Result Date: 07/08/2019 CLINICAL DATA:  Shortness of breath EXAM: PORTABLE CHEST 1 VIEW COMPARISON:  11/07/2018 FINDINGS: The heart size and mediastinal contours are within normal limits. No focal airspace consolidation, pleural effusion, or pneumothorax. The visualized skeletal structures are unremarkable. IMPRESSION: No active disease. Electronically Signed   By: Davina Poke D.O.   On: 07/08/2019 10:39   DG Abd Portable 1V  Result Date: 07/30/2019 CLINICAL DATA:  Altered mental status. Short of breath. Constipation. EXAM: PORTABLE ABDOMEN - 1 VIEW COMPARISON:  CT, 06/08/2018 FINDINGS: Normal bowel gas pattern.  No significant increase colonic stool. No evidence of renal ureteral stones. Soft tissues are unremarkable. Stable right hip hemiarthroplasty.  No acute skeletal abnormality. IMPRESSION: 1. No acute findings.  No evidence of bowel obstruction. 2. No significant increase colonic stool burden. Electronically Signed   By: Lajean Manes M.D.   On: 07/30/2019 09:24   ECHOCARDIOGRAM LIMITED  Result Date: 07/30/2019    ECHOCARDIOGRAM LIMITED REPORT   Patient Name:   Derek  Terrell Date of Exam: 07/30/2019 Medical Rec #:  [id number]  Height:       64.0 in Accession #:    [id number] Weight:       119.0 lb Date of Birth:  06-14-1934   BSA:          1.569 m Patient Age:    70 years   BP:           116/74 mmHg Patient Gender: M          HR:            68 bpm. Exam Location:  Inpatient Procedure: Limited Echo Indications:    786.09 dyspnea  History:        Patient has no prior history of Echocardiogram examinations.                 Risk Factors:Hypertension. Hx of alcohol dependence. Covid +.  Sonographer:    Jannett Celestine RDCS (AE) Referring Phys: [id number] Lequita Halt  Sonographer Comments: No parasternal window. patient in a constricted position (very limited mobility). IMPRESSIONS  1. Technically difficult study with limited views  2. Left ventricular ejection fraction, by estimation, is 65 to 70%. The left ventricle has normal function.  3. Right ventricle is not well visualized  4. The mitral valve is normal in structure. No evidence of mitral valve regurgitation.  5. The aortic valve was not well visualized. Aortic valve regurgitation is not visualized. No aortic stenosis is present.  6. The inferior vena cava is normal in size with greater than 50% respiratory variability, suggesting right atrial pressure of 3 mmHg. FINDINGS  Left Ventricle: Left ventricular ejection fraction, by estimation, is 65 to 70%. The left ventricle has normal function. Right Ventricle: The right ventricular size is not well visualized. Right ventricular systolic function was not well visualized. Pericardium: Trivial pericardial effusion is present. Mitral Valve: The mitral valve is normal in structure. Tricuspid Valve: The tricuspid valve is not well visualized. Aortic Valve: The aortic valve was not well visualized. Aortic valve regurgitation is not visualized. No aortic stenosis is present. Pulmonic Valve: The pulmonic valve was not well visualized. Venous: The inferior vena cava is normal in size with greater than 50% respiratory variability, suggesting right atrial pressure of 3 mmHg. Oswaldo Milian MD Electronically signed by Oswaldo Milian MD Signature Date/Time: 07/30/2019/8:17:32 PM    Final

## 2019-07-31 NOTE — NC FL2 (Signed)
[place] LEVEL OF CARE SCREENING TOOL     IDENTIFICATION  Patient Name: [name] Birthdate: 1935-03-23 Sex: male Admission Date (Current Location): 07/29/2019  [facility] and [place] Number:  Herbalist and Address:  [facility], [place] [address] [place], [place] [address]      Provider Number: [id number]  Attending Physician Name and Address:  Thurnell Lose, MD  Relative Name and Phone Number:  [name], [name]    Current Level of Care: Hospital Recommended Level of Care: [place] Prior Approval Number:    Date Approved/Denied:   PASRR Number: [id number]  Discharge Plan: SNF    Current Diagnoses: Patient Active Problem List   Diagnosis Date Noted  . Seizure ([place]) 07/29/2019  . Status epilepticus ([place])   . Aspiration pneumonitis ([place]) 11/08/2018  . Aspiration pneumonia ([place]) 11/08/2018  . Dementia ([place])   . History of alcohol use disorder   . History of seizures   . Nausea and vomiting   . Rib fracture 03/16/2015  . Anemia 03/16/2015  . Benign essential HTN 12/24/2014  . 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[place]) 12/16/2014    Orientation RESPIRATION BLADDER Height & Weight     Self  Normal External catheter Weight: 119 lb 0.8 oz (54 kg)(Per recent visit <1 month ago) Height:  5\' 4"  (162.6 cm)  BEHAVIORAL SYMPTOMS/MOOD NEUROLOGICAL BOWEL NUTRITION STATUS      Continent Diet(See discharge summary)  AMBULATORY STATUS COMMUNICATION OF NEEDS Skin   Extensive Assist Does not communicate Normal                       Personal Care Assistance Level of Assistance  Bathing, Feeding, Dressing Bathing Assistance: Maximum assistance Feeding assistance: Maximum assistance Dressing Assistance: Maximum assistance     Functional Limitations Info  Sight, Hearing, Speech Sight Info: Adequate Hearing Info: Adequate Speech Info: Impaired(Slurred Speech)    SPECIAL CARE FACTORS FREQUENCY  PT  (By licensed PT), OT (By licensed OT), Speech therapy     PT Frequency: 5x week OT Frequency: 5x week     Speech Therapy Frequency: 5x week      Contractures Contractures Info: Not present    Additional Factors Info  Code Status, Allergies, Isolation Precautions Code Status Info: DNR Allergies Info: NKA     Isolation Precautions Info: Covid Positive     Current Medications (07/31/2019):  This is the current hospital active medication list Current Facility-Administered Medications  Medication Dose Route Frequency Provider Last Rate Last Admin  . acetaminophen (TYLENOL) tablet 650 mg  650 mg Oral Q4H PRN Wynetta Fines T, MD       Or  . acetaminophen (TYLENOL) suppository 650 mg  650 mg Rectal Q4H PRN Wynetta Fines T, MD      . Ampicillin-Sulbactam (UNASYN) 3 g in sodium chloride 0.9 % 100 mL IVPB  3 g Intravenous Q8H Beryle Lathe, RPH 200 mL/hr at 07/31/19 0837 3 g at 07/31/19 0837  . dextrose 5 % and 0.45 % NaCl with KCl 10 mEq/L infusion   Intravenous Continuous Thurnell Lose, MD 75 mL/hr at 07/31/19 0232 New Bag at 07/31/19 0232  . guaiFENesin (ROBITUSSIN) 100 MG/5ML solution 200 mg  200 mg Oral QID PRN Wynetta Fines T, MD      . guaiFENesin-dextromethorphan (ROBITUSSIN DM) 100-10 MG/5ML syrup 10 mL  10 mL Oral Q4H PRN Lequita Halt, MD      .  heparin injection 5,000 Units  5,000 Units Subcutaneous Q8H Thurnell Lose, MD   5,000 Units at 07/31/19 1205  . hydrALAZINE (APRESOLINE) injection 10 mg  10 mg Intravenous Q6H PRN Thurnell Lose, MD      . ipratropium (ATROVENT HFA) inhaler 2 puff  2 puff Inhalation Q6H Lequita Halt, MD   2 puff at 07/31/19 1456  . latanoprost (XALATAN) 0.005 % ophthalmic solution 1 drop  1 drop Both Eyes QHS PRN Wynetta Fines T, MD      . levETIRAcetam (KEPPRA) IVPB 500 mg/100 mL premix  500 mg Intravenous Q12H Greta Doom, MD 400 mL/hr at 07/31/19 0509 500 mg at 07/31/19 0509  . LORazepam (ATIVAN) injection 1 mg  1 mg Intravenous  Q4H PRN Thurnell Lose, MD      . ondansetron [facility]) injection 4 mg  4 mg Intravenous Q6H PRN Wynetta Fines T, MD      . remdesivir 100 mg in sodium chloride 0.9 % 100 mL IVPB  100 mg Intravenous Daily Beryle Lathe, RPH 200 mL/hr at 07/31/19 1107 100 mg at 07/31/19 1107     Discharge Medications: Please see discharge summary for a list of discharge medications.  Relevant Imaging Results:  Relevant Lab Results:   Additional Information [phone]  [name], [place] Work

## 2019-07-31 NOTE — Evaluation (Signed)
Clinical/Bedside Swallow Evaluation Patient Details  Name: [name] MRN: [id number] Date of Birth: Sep 20, 1934  Today's Date: 07/31/2019 Time: SLP Start Time (ACUTE ONLY): [id number] SLP Stop Time (ACUTE ONLY): 1105 SLP Time Calculation (min) (ACUTE ONLY): 24 min  Past Medical History:  Past Medical History:  Diagnosis Date  . Alcohol dependence ([place])    Associated with thrombocytopenia, anemia, elevated LFTs, hypomagnesemia  . Anemia   . Arthritis   . Colon cancer ([place])   . Dementia ([place])    Vascular and alcohol related.  [place] Kitchen HTN (hypertension)   . Seizures ([place])    Related to withdrawal, no seizure in years   Past Surgical History:  Past Surgical History:  Procedure Laterality Date  . COLONOSCOPY    . HEMICOLECTOMY  12/2014  . ORIF FEMORAL NECK FRACTURE W/ DHS     HPI:  85 yo male adm to [facility] with seizure, + COVID 19, found unresponsive, concern for aspiration pna.  Pt PMH + for dementia,. CT chest earlier this month showed right base ATX.  MRI 07/29/19 showed chronic SVD, cerebral HP white matter changes - temporal lobe predominance.  Pt has been npo d/t mentation.  Swallow eval ordered.   Assessment / Plan / Recommendation Clinical Impression  Pt currently with significant wheeze upon SLP entrance to room.  Oral care provided by this SLP removing white viscous secretions.  Pt did not follow any commands nor answer questions.  SlP provided him with 1/2 tsp water after oral care - to which pt did not elicit swallow.  His work of breathing slightly increased momentarily but slowed.  Again weeze never stopped.  Pt's mentation and respiratory deficits do not allow him to have po.  Recommend frequent oral care and SLP follow up . SLP Visit Diagnosis: Dysphagia, oropharyngeal phase (R13.12)    Aspiration Risk  Severe aspiration risk;Risk for inadequate nutrition/hydration    Diet Recommendation NPO        Other  Recommendations Oral Care Recommendations: Oral care QID   Follow up  Recommendations        Frequency and Duration min 1 x/week  1 week       Prognosis Prognosis for Safe Diet Advancement: Guarded Barriers to Reach Goals: Severity of deficits      Swallow Study   General Date of Onset: 07/31/19 HPI: 85 yo male adm to [facility] with seizure, + COVID 19, found unresponsive, concern for aspiration pna.  Pt PMH + for dementia,. CT chest earlier this month showed right base ATX.  MRI 07/29/19 showed chronic SVD, cerebral HP white matter changes - temporal lobe predominance.  Pt has been npo d/t mentation.  Swallow eval ordered. Type of Study: Bedside Swallow Evaluation Diet Prior to this Study: NPO Temperature Spikes Noted: No Respiratory Status: Room air History of Recent Intubation: No Behavior/Cognition: Lethargic/Drowsy;Doesn't follow directions Oral Cavity Assessment: Excessive secretions(whitish secretions coating tongue and hard palate, removed with toothbrush with oral care) Oral Care Completed by SLP: Yes Oral Cavity - Dentition: Missing dentition;Other (Comment)(few dentition) Vision: Impaired for self-feeding Self-Feeding Abilities: Total assist Baseline Vocal Quality: Not observed;Suspected CN X (Vagus) involvement(pt did not verbalize, audible wheezing noted with weak phonation) Volitional Cough: Cognitively unable to elicit Volitional Swallow: Unable to elicit    Oral/Motor/Sensory Function Overall Oral Motor/Sensory Function: Generalized oral weakness(pt did not follow commands to seal lips on suction or spoon, gross weakness, no functional lingual movement)   Ice Chips Ice chips: Not tested   Thin Liquid Thin Liquid: Impaired  Presentation: Spoon Oral Phase Impairments: Poor awareness of bolus;Reduced labial seal Oral Phase Functional Implications: Oral holding Other Comments: pt did not orally transit bolus nor attempt despite dry spoon pressure and total cues; SLP orally suctioned water and secretions    Nectar Thick Nectar Thick Liquid:  Not tested   Honey Thick Honey Thick Liquid: Not tested   Puree Puree: Not tested   Solid     Solid: Not tested      Macario Golds 07/31/2019,11:14 AM    Kathleen Lime, MS [place] Office [phone]

## 2019-07-31 NOTE — Procedures (Addendum)
Patient Name: [name]  MRN: [id number]  Epilepsy Attending: Lora Havens  Referring Physician/Provider: D Duration: 07/30/2019 1531 to 07/31/2019 1027 Duration of study: 19 hours  Patient history: 85 year old male with history of alcohol withdrawal seizures, dementia who presented after seizure-like episode.  EEG to evaluate for seizures.  Level of alertness: Lethargic, asleep  AEDs during EEG study: Keppra  Technical aspects: This EEG study was done with scalp electrodes positioned according to the 10-20 International system of electrode placement. Electrical activity was acquired at a sampling rate of 500Hz  and reviewed with a high frequency filter of 70Hz  and a low frequency filter of 1Hz . EEG data were recorded continuously and digitally stored.   Description: During awake state, no clear posterior dominant was seen.  Sleep was characterized by vertex waves, sleep spindles (12 to 14 Hz), maximal frontocentral. EEG showed continuous generalized 3 to 6 Hz theta-delta slowing.  Hyperventilation and photic stimulation were not performed.    Abnormality - Continuous slow, generalized  IMPRESSION: This study is suggestive of moderate diffuse encephalopathy, nonspecific to etiology. No seizures or epileptiform discharges were seen throughout the recording.  Amair Shrout Barbra Sarks

## 2019-07-31 NOTE — Progress Notes (Signed)
LTM EEG discontinued - no skin breakdown at unhook.   

## 2019-08-01 ENCOUNTER — Inpatient Hospital Stay (HOSPITAL_COMMUNITY): Payer: Medicare Other

## 2019-08-01 LAB — COMPREHENSIVE METABOLIC PANEL
ALT: 23 U/L (ref 0–44)
AST: 36 U/L (ref 15–41)
Albumin: 2 g/dL — ABNORMAL LOW (ref 3.5–5.0)
Alkaline Phosphatase: 48 U/L (ref 38–126)
Anion gap: 11 (ref 5–15)
BUN: 33 mg/dL — ABNORMAL HIGH (ref 8–23)
CO2: 23 mmol/L (ref 22–32)
Calcium: 7.9 mg/dL — ABNORMAL LOW (ref 8.9–10.3)
Chloride: 110 mmol/L (ref 98–111)
Creatinine, Ser: 1.33 mg/dL — ABNORMAL HIGH (ref 0.61–1.24)
GFR calc Af Amer: 57 mL/min — ABNORMAL LOW (ref >60)
GFR calc non Af Amer: 49 mL/min — ABNORMAL LOW (ref >60)
Glucose, Bld: 133 mg/dL — ABNORMAL HIGH (ref 70–99)
Potassium: 4.5 mmol/L (ref 3.5–5.1)
Sodium: 144 mmol/L (ref 135–145)
Total Bilirubin: 1.2 mg/dL (ref 0.3–1.2)
Total Protein: 5.2 g/dL — ABNORMAL LOW (ref 6.5–8.1)

## 2019-08-01 LAB — CBC WITH DIFFERENTIAL/PLATELET
Abs Immature Granulocytes: 0.12 10*3/uL — ABNORMAL HIGH (ref 0.00–0.07)
Basophils Absolute: 0 10*3/uL (ref 0.0–0.1)
Basophils Relative: 0 %
Eosinophils Absolute: 0 10*3/uL (ref 0.0–0.5)
Eosinophils Relative: 0 %
HCT: 36.1 % — ABNORMAL LOW (ref 39.0–52.0)
Hemoglobin: 11.7 g/dL — ABNORMAL LOW (ref 13.0–17.0)
Immature Granulocytes: 1 %
Lymphocytes Relative: 7 %
Lymphs Abs: 1 10*3/uL (ref 0.7–4.0)
MCH: 31 pg (ref 26.0–34.0)
MCHC: 32.4 g/dL (ref 30.0–36.0)
MCV: 95.5 fL (ref 80.0–100.0)
Monocytes Absolute: 1.2 10*3/uL — ABNORMAL HIGH (ref 0.1–1.0)
Monocytes Relative: 8 %
Neutro Abs: 12.9 10*3/uL — ABNORMAL HIGH (ref 1.7–7.7)
Neutrophils Relative %: 84 %
Platelets: 203 10*3/uL (ref 150–400)
RBC: 3.78 MIL/uL — ABNORMAL LOW (ref 4.22–5.81)
RDW: 12.5 % (ref 11.5–15.5)
WBC: 15.2 10*3/uL — ABNORMAL HIGH (ref 4.0–10.5)
nRBC: 0.7 % — ABNORMAL HIGH (ref 0.0–0.2)

## 2019-08-01 LAB — MAGNESIUM: Magnesium: 2.2 mg/dL (ref 1.7–2.4)

## 2019-08-01 LAB — GLUCOSE, CAPILLARY
Glucose-Capillary: 122 mg/dL — ABNORMAL HIGH (ref 70–99)
Glucose-Capillary: 103 mg/dL — ABNORMAL HIGH (ref 70–99)
Glucose-Capillary: 90 mg/dL (ref 70–99)

## 2019-08-01 LAB — C-REACTIVE PROTEIN: CRP: 5.2 mg/dL — ABNORMAL HIGH (ref <1.0)

## 2019-08-01 LAB — D-DIMER, QUANTITATIVE: D-Dimer, Quant: 1.63 ug/mL-FEU — ABNORMAL HIGH (ref 0.00–0.50)

## 2019-08-01 LAB — BRAIN NATRIURETIC PEPTIDE: B Natriuretic Peptide: 3862.1 pg/mL — ABNORMAL HIGH (ref 0.0–100.0)

## 2019-08-01 MED ORDER — RESOURCE THICKENUP CLEAR PO POWD
ORAL | Status: DC | PRN
  Filled 2019-08-01: qty 125

## 2019-08-01 MED ORDER — DIVALPROEX SODIUM 125 MG PO CSDR
500.0000 mg | DELAYED_RELEASE_CAPSULE | Freq: Two times a day (BID) | ORAL | Status: DC
Start: 2019-08-01 — End: 2019-08-05
  Administered 2019-08-01 – 2019-08-05 (×8): 500 mg via ORAL
  Filled 2019-08-01 (×8): qty 4

## 2019-08-01 MED ORDER — SODIUM CHLORIDE 0.9 % IV SOLN
INTRAVENOUS | Status: DC | PRN
  Administered 2019-08-01 – 2019-08-02 (×2): 250 mL via INTRAVENOUS

## 2019-08-01 NOTE — Progress Notes (Signed)
PROGRESS NOTE                                                                                                                                                                                                             Patient Demographics:    [name], is a 85 y.o. male, DOB - 05/23/1934, RVI:[id number]  Outpatient Primary MD for the patient is Sande Brothers, MD    LOS - 3  Admit date - 07/29/2019    Chief Complaint  Patient presents with  . Altered Mental Status       Brief Narrative   [name] is a 85 y.o. male with medical history significant of remote history of alcohol dependence, hypertension, seizure, dementia, presented with unresponsiveness, he lives at a memory care unit and was noted to be less responsive for 24 to 48 hours prior to hospital visit.  In the ER he had a visualized tonic-clonic seizure, he was also found to have COVID-19 pneumonia along with a suspicion of aspiration pneumonia.  He was seen by neurology and admitted for further care.   Subjective:   Patient in bed appears to be in no distress, more awake alert today, denies any headache or chest pain.  No shortness of breath.   Assessment  & Plan :     1. Acute Hypoxic Resp. Failure due to Acute Covid 19 Viral Pneumonitis during the ongoing 2020 Covid 19 Pandemic - he has mild to moderate disease at best, has been started on steroids and remdesivir.  Will be monitored.  Not a candidate for Actemra use.  Once more alert he will be encouraged to sit up in chair in the daytime use I-S and flutter valve for pulmonary toiletry and then prone in bed when at night.   Recent Labs  Lab 07/29/19 1630 07/30/19 1046 07/31/19 0610 08/01/19 0604  CRP  --  7.1* 5.4* 5.2*  DDIMER  --  >20.00* 1.43* 1.63*  BNP  --  173.3* 39.8 3,862.1*  PROCALCITON  --  0.21  --   --   SARSCOV2NAA POSITIVE*  --   --   --     Hepatic Function Latest Ref Rng & Units  08/01/2019 07/31/2019 07/29/2019  Total Protein 6.5 - 8.1 g/dL 5.2(L) 5.5(L) 7.5  Albumin 3.5 - 5.0 g/dL 2.0(L) 2.1(L) 2.8(L)  AST 15 -  41 U/L 36 39 44(H)  ALT 0 - 44 U/L '23 24 26  ' Alk Phosphatase 38 - 126 U/L 48 38 52  Total Bilirubin 0.3 - 1.2 mg/dL 1.2 1.1 1.8(H)  Bilirubin, Direct 0.0 - 0.3 mg/dL - - -      2.  Breakthrough seizures.  Question if due to his decreased mental status he was unable to take his oral antiseizure medication at home, CTA & MRI nonacute, neuro on board, as needed IV Ativan and IV Keppra for now.  Monitor closely.  3.  Possible aspiration pneumonia.  Unasyn.  Speech to evaluate once mentation improves.  Currently n.p.o.  4.  Hypertension.  Due to n.p.o. status placed on as needed hydralazine.    Patient has very poor baseline quality of life, extremely sick, discussed with daughter, plan is to continue present line of care for a short while, at any stage if it looks like he is suffering we will transition to full comfort measures.  No heroics to be done. Will involve palliative care as well, discussed with daughter who agrees.  He will finally be transition to either SNF with palliative care or residential hospice depending on his clinical course.    Condition - Extremely Guarded  Family Communication  :  Daughter [name] 07/30/19, 3/18 - [phone], 08/01/19  Code Status :  DNR  Diet :   Diet Order            DIET - DYS 1 Room service appropriate? No; Fluid consistency: Honey Thick  Diet effective now               Disposition Plan  : Stay in the hospital finished treatment for COVID-19 pneumonia, also being treated for breakthrough seizures.  Consults  :  Neuro  Procedures  :    EEG - nonspecific encephalopathy no ongoing seizures.  MRI Brain and CTA head and Neck - no acute changes  PUD Prophylaxis :    DVT Prophylaxis  :   Heparin   Lab Results  Component Value Date   PLT 203 08/01/2019    Inpatient Medications  Scheduled  Meds: . heparin  5,000 Units Subcutaneous Q8H  . ipratropium  2 puff Inhalation Q6H   Continuous Infusions: . ampicillin-sulbactam (UNASYN) IV 3 g (08/01/19 1129)  . levETIRAcetam Stopped (08/01/19 0512)  . remdesivir 100 mg in NS 100 mL 100 mg (08/01/19 1050)   PRN Meds:.acetaminophen **OR** acetaminophen, guaiFENesin, guaiFENesin-dextromethorphan, hydrALAZINE, latanoprost, LORazepam, [DISCONTINUED] ondansetron **OR** ondansetron (ZOFRAN) IV, Resource ThickenUp Clear  Antibiotics  :    Anti-infectives (From admission, onward)   Start     Dose/Rate Route Frequency Ordered Stop   07/30/19 1000  remdesivir 100 mg in sodium chloride 0.9 % 100 mL IVPB  Status:  Discontinued     100 mg 200 mL/hr over 30 Minutes Intravenous Daily 07/29/19 1847 07/29/19 1849   07/30/19 1000  remdesivir 100 mg in sodium chloride 0.9 % 100 mL IVPB     100 mg 200 mL/hr over 30 Minutes Intravenous Daily 07/29/19 1851 08/03/19 0959   07/30/19 0200  Ampicillin-Sulbactam (UNASYN) 3 g in sodium chloride 0.9 % 100 mL IVPB     3 g 200 mL/hr over 30 Minutes Intravenous Every 8 hours 07/29/19 1710     07/29/19 1900  remdesivir 200 mg in sodium chloride 0.9% 250 mL IVPB  Status:  Discontinued     200 mg 580 mL/hr over 30 Minutes Intravenous Once 07/29/19 1847 07/29/19 1849  07/29/19 1900  remdesivir 100 mg in sodium chloride 0.9 % 100 mL IVPB     100 mg 200 mL/hr over 30 Minutes Intravenous Every 30 min 07/29/19 1851 07/29/19 2218   07/29/19 1615  Ampicillin-Sulbactam (UNASYN) 3 g in sodium chloride 0.9 % 100 mL IVPB     3 g 200 mL/hr over 30 Minutes Intravenous  Once 07/29/19 1611 07/29/19 1928       Time Spent in minutes  30   Lala Lund M.D on 08/01/2019 at 12:14 PM  To page go to [email or web address] - password [email or web address]  [facility] -  Office  [phone]   See all Orders from today for further details    Objective:   Vitals:   07/31/19 2000 08/01/19 0003 08/01/19 0501 08/01/19 0757  BP: 120/73  132/64 119/66 132/83  Pulse: 64 88 71 63  Resp: '18 18 15 19  ' Temp: 97.7 F (36.5 C) 97.9 F (36.6 C) 97.7 F (36.5 C) 97.7 F (36.5 C)  TempSrc: Oral Oral Oral Oral  SpO2: 97% 94% 98% 90%  Weight:      Height:        Wt Readings from Last 3 Encounters:  07/29/19 54 kg  07/08/19 54 kg  11/11/18 57.5 kg     Intake/Output Summary (Last 24 hours) at 08/01/2019 1214 Last data filed at 08/01/2019 0525 Gross per 24 hour  Intake 1304.24 ml  Output 300 ml  Net 1004.24 ml     Physical Exam  Extremely weak and frail elderly African-American male in hospital bed in no distress, he is awake more alert today, answers a few questions, denies any headache, says he is not hungry, moves all 4 extremities by himself [place].AT,PERRAL Supple Neck,No JVD, No cervical lymphadenopathy appriciated.  Symmetrical Chest wall movement, Good air movement bilaterally, CTAB RRR,No Gallops, Rubs or new Murmurs, No Parasternal Heave +ve B.Sounds, Abd Soft, No tenderness, No organomegaly appriciated, No rebound - guarding or rigidity. No Cyanosis, Clubbing or edema, No new Rash or bruise    Data Review:    CBC Recent Labs  Lab 07/29/19 1400 07/29/19 1415 07/29/19 1828 07/30/19 0546 07/30/19 1046 07/31/19 0610 08/01/19 0604  WBC 11.7*  --   --  9.4 14.6* 15.0* 15.2*  HGB 13.6   < > 12.6* 13.1 13.8 11.5* 11.7*  HCT 44.8   < > 37.0* 41.6 44.3 36.2* 36.1*  PLT 292  --   --  340 238 220 203  MCV 100.9*  --   --  97.2 97.1 95.5 95.5  MCH 30.6  --   --  30.6 30.3 30.3 31.0  MCHC 30.4  --   --  31.5 31.2 31.8 32.4  RDW 12.2  --   --  12.9 12.1 12.3 12.5  LYMPHSABS 3.2  --   --  0.8  --  0.9 1.0  MONOABS 0.9  --   --  0.2  --  0.8 1.2*  EOSABS 0.0  --   --  0.0  --  0.0 0.0  BASOSABS 0.1  --   --  0.0  --  0.0 0.0   < > = values in this interval not displayed.    Chemistries  Recent Labs  Lab 07/28/19 1901 07/28/19 1901 07/29/19 1400 07/29/19 1400 07/29/19 1415 07/29/19 1828 07/30/19 1046  07/31/19 0610 08/01/19 0604  NA 137   < > 139   < > 138 140 144 144 144  K 4.4   < > 4.3   < >  5.1 4.0 4.2 3.9 4.5  CL 96*  --  99  --   --   --  101 106 110  CO2 27  --  19*  --   --   --  '25 26 23  ' GLUCOSE 95  --  129*  --   --   --  104* 142* 133*  BUN 18  --  19  --   --   --  19 31* 33*  CREATININE 1.07  --  1.21  --   --   --  1.03 1.32* 1.33*  CALCIUM 9.3  --  9.0  --   --   --  9.0 8.2* 7.9*  AST  --   --  44*  --   --   --   --  39 36  ALT  --   --  26  --   --   --   --  24 23  ALKPHOS  --   --  52  --   --   --   --  38 48  BILITOT  --   --  1.8*  --   --   --   --  1.1 1.2  MG  --   --   --   --   --   --  1.7 1.5* 2.2  AMMONIA  --   --   --   --  29  --   --   --   --    < > = values in this interval not displayed.     ------------------------------------------------------------------------------------------------------------------ No results for input(s): CHOL, HDL, LDLCALC, TRIG, CHOLHDL, LDLDIRECT in the last 72 hours.  Lab Results  Component Value Date   HGBA1C 5.1 12/14/2014   ------------------------------------------------------------------------------------------------------------------ No results for input(s): TSH, T4TOTAL, T3FREE, THYROIDAB in the last 72 hours.  Invalid input(s): FREET3  Cardiac Enzymes No results for input(s): CKMB, TROPONINI, MYOGLOBIN in the last 168 hours.  Invalid input(s): CK ------------------------------------------------------------------------------------------------------------------    Component Value Date/Time   BNP 3,862.1 (H) 08/01/2019 3662    Micro Results Recent Results (from the past 240 hour(s))  Urine culture     Status: Abnormal   Collection Time: 07/29/19  6:09 AM   Specimen: Urine, Random  Result Value Ref Range Status   Specimen Description URINE, RANDOM  Final   Special Requests   Final    NONE Performed at [facility], [address] [place], [place] [address]    Culture MULTIPLE SPECIES  PRESENT, SUGGEST RECOLLECTION (A)  Final   Report Status 07/30/2019 FINAL  Final  Respiratory Panel by RT PCR (Flu A&B, Covid) - Nasopharyngeal Swab     Status: Abnormal   Collection Time: 07/29/19  4:30 PM   Specimen: Nasopharyngeal Swab  Result Value Ref Range Status   SARS Coronavirus 2 by RT PCR POSITIVE (A) NEGATIVE Final    Comment: RESULT CALLED TO, READ BACK BY AND VERIFIED WITHRhona Leavens RN 1817 07/29/19 A BROWNING (NOTE) SARS-CoV-2 target nucleic acids are DETECTED. SARS-CoV-2 RNA is generally detectable in upper respiratory specimens  during the acute phase of infection. Positive results are indicative of the presence of the identified virus, but do not rule out bacterial infection or co-infection with other pathogens not detected by the test. Clinical correlation with patient history and other diagnostic information is necessary to determine patient infection status. The expected result is Negative. Fact Sheet for Patients:  PinkCheek.be Fact Sheet for  Healthcare Providers: GravelBags.it This test is not yet approved or cleared by the [place] and  has been authorized for detection and/or diagnosis of SARS-CoV-2 by FDA under an Emergency Use Authorization (EUA).  This EUA will remain in effect (meaning this test can be used) for  the duration of  the COVID-19 declaration under Section 564(b)(1) of the Act, 21 U.S.C. section 360bbb-3(b)(1), unless the authorization is terminated or revoked sooner.    Influenza A by PCR NEGATIVE NEGATIVE Final   Influenza B by PCR NEGATIVE NEGATIVE Final    Comment: (NOTE) The Xpert Xpress SARS-CoV-2/FLU/RSV assay is intended as an aid in  the diagnosis of influenza from Nasopharyngeal swab specimens and  should not be used as a sole basis for treatment. Nasal washings and  aspirates are unacceptable for Xpert Xpress SARS-CoV-2/FLU/RSV  testing. Fact Sheet for  Patients: PinkCheek.be Fact Sheet for Healthcare Providers: GravelBags.it This test is not yet approved or cleared by the [place] FDA and  has been authorized for detection and/or diagnosis of SARS-CoV-2 by  FDA under an Emergency Use Authorization (EUA). This EUA will remain  in effect (meaning this test can be used) for the duration of the  Covid-19 declaration under Section 564(b)(1) of the Act, 21  U.S.C. section 360bbb-3(b)(1), unless the authorization is  terminated or revoked. Performed at [facility], [place] [address] [place], [place] [address]     Radiology Reports EEG  Result Date: 07/29/2019 Lora Havens, MD     07/29/2019  4:15 PM Patient Name: Masaru Chamberlin MRN: [id number] Epilepsy Attending: Lora Havens Referring Physician/Provider: Etta Quill, PA Date: 07/29/2019 Duration: 26.07 minutes Patient history: 85 year old male with history of alcohol withdrawal seizures, dementia who presented after seizure-like episode.  EEG to evaluate for seizures. Level of alertness: Lethargic AEDs during EEG study: Ativan, Depakote Technical aspects: This EEG study was done with scalp electrodes positioned according to the 10-20 International system of electrode placement. Electrical activity was acquired at a sampling rate of '500Hz'  and reviewed with a high frequency filter of '70Hz'  and a low frequency filter of '1Hz' . EEG data were recorded continuously and digitally stored. Description: No clear posterior dominant was seen.  EEG showed continuous generalized low amplitude 2 to 3 Hz delta slowing.  Hyperventilation and photic stimulation were not performed.  Of note, EEG was technically difficult secondary to significant myogenic artifact. Abnormality - Continuous slow, generalized IMPRESSION: This technically difficult study is suggestive of moderate to severe diffuse encephalopathy, nonspecific to etiology. No seizures or  epileptiform discharges were seen throughout the recording. Lora Havens   CT Angio Head W or Wo Contrast  Result Date: 07/29/2019 CLINICAL DATA:  Encephalopathy. Additional history provided: Unresponsive episode, at renal respirations, CPR, seizure EN route EXAM: CT ANGIOGRAPHY HEAD AND NECK TECHNIQUE: Multidetector CT imaging of the head and neck was performed using the standard protocol during bolus administration of intravenous contrast. Multiplanar CT image reconstructions and MIPs were obtained to evaluate the vascular anatomy. Carotid stenosis measurements (when applicable) are obtained utilizing NASCET criteria, using the distal internal carotid diameter as the denominator. CONTRAST:  68m OMNIPAQUE IOHEXOL 350 MG/ML SOLN COMPARISON:  Noncontrast head CT 07/29/2019 FINDINGS: CTA NECK FINDINGS The examination is mildly motion degraded. Aortic arch: Common origin of the innominate and left common carotid arteries. The visualized aortic arch is unremarkable. No innominate or proximal subclavian artery stenosis. Right carotid system: CCA and ICA patent within the neck without stenosis. Left carotid system: CCA  and ICA patent within the neck without stenosis. Vertebral arteries: The right vertebral artery is dominant. The vertebral arteries are patent within the neck bilaterally without stenosis. Skeleton: No acute bony abnormality or aggressive osseous lesion identified. Cervical spondylosis without high-grade bony spinal canal narrowing. Other neck: No neck mass or cervical lymphadenopathy. Upper chest: No consolidation within the imaged lung apices. Review of the MIP images confirms the above findings CTA HEAD FINDINGS Anterior circulation: The intracranial internal carotid arteries are patent without stenosis. The M1 middle cerebral arteries are patent without significant stenosis. No M2 proximal branch occlusion or high-grade proximal stenosis is identified. The anterior cerebral arteries are patent  without high-grade proximal stenosis. No intracranial aneurysm is identified. Posterior circulation: The dominant intracranial right vertebral artery is patent without stenosis and supplies the basilar artery. The non dominant intracranial left vertebral artery is markedly diminutive beyond the origin of the left PICA, although faintly patent. The basilar artery is patent without significant stenosis. Predominantly fetal origin of the right posterior cerebral artery which is patent without significant proximal stenosis. The left posterior cerebral artery is patent without significant proximal stenosis. A small left posterior communicating artery is present. Venous sinuses: Within limitations of contrast timing, no convincing thrombus. Anatomic variants: As described Review of the MIP images confirms the above findings IMPRESSION: CTA neck: The bilateral common carotid, internal carotid and vertebral arteries are patent within the neck without stenosis. The right vertebral artery is dominant. CTA head: 1. No intracranial large vessel occlusion. 2. The intracranial left vertebral artery is markedly diminutive beyond the origin of the left PICA. This is suspected largely on a developmental basis given significant non dominance. A significant V4 left vertebral artery stenosis cannot be definitively excluded. The dominant intracranial right vertebral artery is widely patent and supplies the basilar artery. 3. Otherwise, no evidence of proximal high-grade arterial stenosis. Electronically Signed   By: Kellie Simmering DO   On: 07/29/2019 15:23   CT HEAD WO CONTRAST  Result Date: 07/29/2019 CLINICAL DATA:  Encephalopathy. EXAM: CT HEAD WITHOUT CONTRAST TECHNIQUE: Contiguous axial images were obtained from the base of the skull through the vertex without intravenous contrast. COMPARISON:  Head CT 03/07/2015 FINDINGS: Brain: There is no evidence of acute intracranial hemorrhage, intracranial mass, midline shift or  extra-axial fluid collection.No demarcated cortical infarction. Moderate ill-defined hypoattenuation within the cerebral white matter is similar to prior examination 03/07/2015 and nonspecific, but consistent with chronic small vessel ischemic disease. Moderate generalized parenchymal atrophy has progressed. Vascular: No hyperdense vessel.  Atherosclerotic calcifications. Skull: Normal. Negative for fracture or focal lesion. Sinuses/Orbits: Visualized orbits demonstrate no acute abnormality. No significant paranasal sinus disease or mastoid effusion at the imaged levels. Other: Partially visualized support tubes. IMPRESSION: No evidence of acute intracranial abnormality. Moderate chronic small vessel ischemic disease, stable as compared to head CT 03/07/2015. Moderate generalized parenchymal atrophy has progressed. Electronically Signed   By: Kellie Simmering DO   On: 07/29/2019 14:16   CT Angio Neck W and/or Wo Contrast  Result Date: 07/29/2019 CLINICAL DATA:  Encephalopathy. Additional history provided: Unresponsive episode, at renal respirations, CPR, seizure EN route EXAM: CT ANGIOGRAPHY HEAD AND NECK TECHNIQUE: Multidetector CT imaging of the head and neck was performed using the standard protocol during bolus administration of intravenous contrast. Multiplanar CT image reconstructions and MIPs were obtained to evaluate the vascular anatomy. Carotid stenosis measurements (when applicable) are obtained utilizing NASCET criteria, using the distal internal carotid diameter as the denominator. CONTRAST:  49m OMNIPAQUE IOHEXOL  350 MG/ML SOLN COMPARISON:  Noncontrast head CT 07/29/2019 FINDINGS: CTA NECK FINDINGS The examination is mildly motion degraded. Aortic arch: Common origin of the innominate and left common carotid arteries. The visualized aortic arch is unremarkable. No innominate or proximal subclavian artery stenosis. Right carotid system: CCA and ICA patent within the neck without stenosis. Left carotid  system: CCA and ICA patent within the neck without stenosis. Vertebral arteries: The right vertebral artery is dominant. The vertebral arteries are patent within the neck bilaterally without stenosis. Skeleton: No acute bony abnormality or aggressive osseous lesion identified. Cervical spondylosis without high-grade bony spinal canal narrowing. Other neck: No neck mass or cervical lymphadenopathy. Upper chest: No consolidation within the imaged lung apices. Review of the MIP images confirms the above findings CTA HEAD FINDINGS Anterior circulation: The intracranial internal carotid arteries are patent without stenosis. The M1 middle cerebral arteries are patent without significant stenosis. No M2 proximal branch occlusion or high-grade proximal stenosis is identified. The anterior cerebral arteries are patent without high-grade proximal stenosis. No intracranial aneurysm is identified. Posterior circulation: The dominant intracranial right vertebral artery is patent without stenosis and supplies the basilar artery. The non dominant intracranial left vertebral artery is markedly diminutive beyond the origin of the left PICA, although faintly patent. The basilar artery is patent without significant stenosis. Predominantly fetal origin of the right posterior cerebral artery which is patent without significant proximal stenosis. The left posterior cerebral artery is patent without significant proximal stenosis. A small left posterior communicating artery is present. Venous sinuses: Within limitations of contrast timing, no convincing thrombus. Anatomic variants: As described Review of the MIP images confirms the above findings IMPRESSION: CTA neck: The bilateral common carotid, internal carotid and vertebral arteries are patent within the neck without stenosis. The right vertebral artery is dominant. CTA head: 1. No intracranial large vessel occlusion. 2. The intracranial left vertebral artery is markedly diminutive  beyond the origin of the left PICA. This is suspected largely on a developmental basis given significant non dominance. A significant V4 left vertebral artery stenosis cannot be definitively excluded. The dominant intracranial right vertebral artery is widely patent and supplies the basilar artery. 3. Otherwise, no evidence of proximal high-grade arterial stenosis. Electronically Signed   By: Kellie Simmering DO   On: 07/29/2019 15:23   CT Chest Wo Contrast  Result Date: 07/15/2019 CLINICAL DATA:  Chronic dyspnea. EXAM: CT CHEST WITHOUT CONTRAST TECHNIQUE: Multidetector CT imaging of the chest was performed following the standard protocol without IV contrast. COMPARISON:  September 09, 2014. FINDINGS: Cardiovascular: No significant vascular findings. Normal heart size. No pericardial effusion. Mediastinum/Nodes: No enlarged mediastinal or axillary lymph nodes. Thyroid gland, trachea, and esophagus demonstrate no significant findings. Lungs/Pleura: No pneumothorax or pleural effusion is noted. Left lung is clear. Mild right posterior basilar subsegmental atelectasis is noted. Upper Abdomen: Cholelithiasis is noted. Musculoskeletal: No chest wall mass or suspicious bone lesions identified. IMPRESSION: 1. Mild right posterior basilar subsegmental atelectasis. 2. Cholelithiasis. Electronically Signed   By: Marijo Conception M.D.   On: 07/15/2019 13:38   MR BRAIN WO CONTRAST  Result Date: 07/29/2019 CLINICAL DATA:  Seizure with subsequent mental status changes. EXAM: MRI HEAD WITHOUT CONTRAST TECHNIQUE: Multiplanar, multiecho pulse sequences of the brain and surrounding structures were obtained without intravenous contrast. COMPARISON:  CT studies earlier same day. FINDINGS: Brain: Diffusion imaging does not show any acute or subacute infarction. There is generalized brain atrophy. No focal abnormality affects the brainstem or cerebellum. The cerebral hemispheres  show chronic small-vessel ischemic changes of the white  matter. No large vessel territory infarction. No mass lesion, hemorrhage, hydrocephalus or extra-axial collection. No focal mesial temporal lesion. There is considerable temporal lobe atrophy. Vascular: Major vessels at the base of the brain show flow. Skull and upper cervical spine: Negative Sinuses/Orbits: Clear/normal Other: None IMPRESSION: No acute or reversible finding. Generalized brain atrophy, with some temporal lobe predominance. Chronic small-vessel ischemic changes of the cerebral hemispheric white matter. Electronically Signed   By: Nelson Chimes M.D.   On: 07/29/2019 18:13   DG Chest Port 1 View  Result Date: 08/01/2019 CLINICAL DATA:  Shortness of breath EXAM: PORTABLE CHEST 1 VIEW COMPARISON:  July 30, 2019 FINDINGS: A portion of the right apex is obscured by the patient's mandible. There is ill-defined opacity in each lung base, a new finding. Heart size and pulmonary vascular normal. No adenopathy. Old healed rib fractures on the left again noted. Underlying osteoporosis noted. IMPRESSION: Ill-defined opacity in the lung bases, concerning for early pneumonia in these regions. Aspiration could present similarly. Lungs elsewhere clear. Heart size normal. Bones osteoporotic. Electronically Signed   By: Lowella Grip III M.D.   On: 08/01/2019 08:40   DG Chest Port 1 View  Result Date: 07/30/2019 CLINICAL DATA:  Shortness of breath, COVID symptoms EXAM: PORTABLE CHEST 1 VIEW COMPARISON:  07/29/2019 FINDINGS: The heart size and mediastinal contours are within normal limits. Both lungs are clear. Callused fracture deformities of the left fifth and sixth ribs again noted. Acute fractures are not appreciated. IMPRESSION: No acute abnormality of the lungs in AP portable projection. Electronically Signed   By: Eddie Candle M.D.   On: 07/30/2019 08:39   DG Chest Port 1 View  Result Date: 07/29/2019 CLINICAL DATA:  Altered level of consciousness, CPR EXAM: PORTABLE CHEST 1 VIEW COMPARISON:   07/29/2019 at 0211 hours FINDINGS: The heart size and mediastinal contours are within normal limits. Linear right basilar atelectasis. Lungs otherwise clear. Probable nondisplaced rib fractures involving the lateral aspects of the left third, fourth, and fifth ribs. No pneumothorax. IMPRESSION: Probable nondisplaced rib fractures involving the lateral aspects of the left third, fourth, and fifth ribs. No pneumothorax. Electronically Signed   By: Davina Poke D.O.   On: 07/29/2019 14:03   DG Chest Port 1 View  Result Date: 07/29/2019 CLINICAL DATA:  85 year old male with shortness of breath. EXAM: PORTABLE CHEST 1 VIEW COMPARISON:  Chest radiograph dated 07/15/2019. FINDINGS: No focal consolidation, pleural effusion or pneumothorax. Right lung base atelectasis/scarring. The cardiac silhouette is within normal limits. Osteopenia with degenerative changes of the spine. No acute osseous pathology. IMPRESSION: No acute cardiopulmonary process. Electronically Signed   By: Anner Crete M.D.   On: 07/29/2019 02:37   DG Chest Port 1 View  Result Date: 07/15/2019 CLINICAL DATA:  Increased difficulty breathing, cough and congestion for 2 weeks, history dementia, hypertension, colon cancer, former smoker EXAM: PORTABLE CHEST 1 VIEW COMPARISON:  Portable exam 1017 hours compared to 07/08/2019 FINDINGS: Normal heart size, mediastinal contours, and pulmonary vascularity. Subsegmental atelectasis at RIGHT base unchanged. Remaining lungs clear. No pleural effusion or pneumothorax. Bones demineralized. IMPRESSION: Chronic RIGHT basilar atelectasis without acute infiltrate. Electronically Signed   By: Lavonia Dana M.D.   On: 07/15/2019 11:01   DG Chest Port 1 View  Result Date: 07/08/2019 CLINICAL DATA:  Shortness of breath EXAM: PORTABLE CHEST 1 VIEW COMPARISON:  11/07/2018 FINDINGS: The heart size and mediastinal contours are within normal limits. No focal airspace consolidation,  pleural effusion, or pneumothorax.  The visualized skeletal structures are unremarkable. IMPRESSION: No active disease. Electronically Signed   By: Davina Poke D.O.   On: 07/08/2019 10:39   DG Abd Portable 1V  Result Date: 07/30/2019 CLINICAL DATA:  Altered mental status. Short of breath. Constipation. EXAM: PORTABLE ABDOMEN - 1 VIEW COMPARISON:  CT, 06/08/2018 FINDINGS: Normal bowel gas pattern.  No significant increase colonic stool. No evidence of renal ureteral stones. Soft tissues are unremarkable. Stable right hip hemiarthroplasty.  No acute skeletal abnormality. IMPRESSION: 1. No acute findings.  No evidence of bowel obstruction. 2. No significant increase colonic stool burden. Electronically Signed   By: Lajean Manes M.D.   On: 07/30/2019 09:24   Overnight EEG with video  Result Date: 07/31/2019 Lora Havens, MD     07/31/2019 12:01 PM Patient Name: [name] MRN: [id number] Epilepsy Attending: Lora Havens Referring Physician/Provider: D Duration: 07/30/2019 1531 to 07/31/2019 1027 Duration of study: 19 hours  Patient history: 85 year old male with history of alcohol withdrawal seizures, dementia who presented after seizure-like episode.  EEG to evaluate for seizures.  Level of alertness: Lethargic, asleep  AEDs during EEG study: Keppra  Technical aspects: This EEG study was done with scalp electrodes positioned according to the 10-20 International system of electrode placement. Electrical activity was acquired at a sampling rate of '500Hz'  and reviewed with a high frequency filter of '70Hz'  and a low frequency filter of '1Hz' . EEG data were recorded continuously and digitally stored.  Description: During awake state, no clear posterior dominant was seen.  Sleep was characterized by vertex waves, sleep spindles (12 to 14 Hz), maximal frontocentral. EEG showed continuous generalized 3 to 6 Hz theta-delta slowing.  Hyperventilation and photic stimulation were not performed.   Abnormality - Continuous slow, generalized   IMPRESSION: This study is suggestive of moderate diffuse encephalopathy, nonspecific to etiology. No seizures or epileptiform discharges were seen throughout the recording.  Lora Havens   ECHOCARDIOGRAM LIMITED  Result Date: 07/30/2019    ECHOCARDIOGRAM LIMITED REPORT   Patient Name:   TUDOR CHANDLEY Date of Exam: 07/30/2019 Medical Rec #:  [id number]  Height:       64.0 in Accession #:    [id number] Weight:       119.0 lb Date of Birth:  21-Nov-1934   BSA:          1.569 m Patient Age:    5 years   BP:           116/74 mmHg Patient Gender: M          HR:           68 bpm. Exam Location:  Inpatient Procedure: Limited Echo Indications:    786.09 dyspnea  History:        Patient has no prior history of Echocardiogram examinations.                 Risk Factors:Hypertension. Hx of alcohol dependence. Covid +.  Sonographer:    Jannett Celestine RDCS (AE) Referring Phys: [id number] Lequita Halt  Sonographer Comments: No parasternal window. patient in a constricted position (very limited mobility). IMPRESSIONS  1. Technically difficult study with limited views  2. Left ventricular ejection fraction, by estimation, is 65 to 70%. The left ventricle has normal function.  3. Right ventricle is not well visualized  4. The mitral valve is normal in structure. No evidence of mitral valve regurgitation.  5. The aortic valve was not well visualized.  Aortic valve regurgitation is not visualized. No aortic stenosis is present.  6. The inferior vena cava is normal in size with greater than 50% respiratory variability, suggesting right atrial pressure of 3 mmHg. FINDINGS  Left Ventricle: Left ventricular ejection fraction, by estimation, is 65 to 70%. The left ventricle has normal function. Right Ventricle: The right ventricular size is not well visualized. Right ventricular systolic function was not well visualized. Pericardium: Trivial pericardial effusion is present. Mitral Valve: The mitral valve is normal in structure. Tricuspid  Valve: The tricuspid valve is not well visualized. Aortic Valve: The aortic valve was not well visualized. Aortic valve regurgitation is not visualized. No aortic stenosis is present. Pulmonic Valve: The pulmonic valve was not well visualized. Venous: The inferior vena cava is normal in size with greater than 50% respiratory variability, suggesting right atrial pressure of 3 mmHg. Oswaldo Milian MD Electronically signed by Oswaldo Milian MD Signature Date/Time: 07/30/2019/8:17:32 PM    Final

## 2019-08-01 NOTE — Progress Notes (Signed)
OT Cancellation Note  Patient Details Name: [name] MRN: [id number] DOB: July 06, 1934   Cancelled Treatment:    Reason Eval/Treat Not Completed: Other (comment) Per secure chat with Dr. Candiss Norse, DC OT/PT as pt going comfort care. Palliative at SNF vs hospice services.   [name] 08/01/2019, 1:34 PM

## 2019-08-01 NOTE — Progress Notes (Signed)
  Speech Language Pathology Treatment: Dysphagia  Patient Details Name: [name] MRN: [id number] DOB: 09-27-1934 Today's Date: 08/01/2019 Time: [id number] SLP Time Calculation (min) (ACUTE ONLY): 24 min  Assessment / Plan / Recommendation Clinical Impression  High suspicion pt is not protecting his airway during and after po's with likely esophageal involvement. Immediate coughing after thin, increased wheezing, audible swallow with multiple sub swallows, consistent throat clearing and eructation all indicative of pharyngeal and/or esophageal dysfunction. He has history of ETOH abuse. Spoke with MD who reported plan is for pt to have comfort care/comfort feeds. Will order puree and honey thick liquids. Please sit pt upright when eating, keep upright after meals, eat slow and allow extra time to swallow. ST will sign off at this time. Please reconsult if needed.     HPI HPI: 85 yo male adm to [facility] with seizure, + COVID 19, found unresponsive, concern for aspiration pna.  Pt PMH + for dementia,. CT chest earlier this month showed right base ATX.  MRI 07/29/19 showed chronic SVD, cerebral HP white matter changes - temporal lobe predominance.  Pt has been npo d/t mentation.  Swallow eval ordered.      SLP Plan  Other (Comment);Discharge SLP treatment due to (comment)(going comfort)       Recommendations  Diet recommendations: Dysphagia 1 (puree);Honey-thick liquid Liquids provided via: Cup Medication Administration: Crushed with puree Supervision: Staff to assist with self feeding;Full supervision/cueing for compensatory strategies Compensations: Small sips/bites;Slow rate;Lingual sweep for clearance of pocketing;Clear throat intermittently Postural Changes and/or Swallow Maneuvers: Seated upright 90 degrees;Upright 30-60 min after meal                Oral Care Recommendations: Oral care QID Follow up Recommendations: None SLP Visit Diagnosis: Dysphagia, unspecified (R13.10) Plan: Other  (Comment);Discharge SLP treatment due to (comment)(going comfort)       GO                Houston Siren 08/01/2019, 10:05 AM

## 2019-08-01 NOTE — Plan of Care (Signed)
  Problem: Education: Goal: Knowledge of General Education information will improve Description: Including pain rating scale, medication(s)/side effects and non-pharmacologic comfort measures Outcome: Not Progressing   

## 2019-08-01 NOTE — Progress Notes (Signed)
Subjective: Mental Status is improving.   Exam: Vitals:   08/01/19 0757 08/01/19 1211  BP: 132/83 131/68  Pulse: 63 62  Resp: 19 15  Temp: 97.7 F (36.5 C) 97.8 F (36.6 C)  SpO2: 90% 98%   Gen: In bed, NAD Resp: non-labored breathing, no acute distress Abd: soft, nt  Neuro: MS: Awake, gives me his name, gives age is 34 and month is January CN: He responds to visual stimuli both fields, crosses midline in both directions. He holds his right eye closed when follow me, holds his eyes tightly shut on try to check his pupils, however there relatively equal. Motor: Moves all extremities, though does not cooperate with formal testing Sensory: Response to stimulation in all 4 extremities  Impression: 85 year old male with a history of alcohol withdrawal seizures now with unprovoked seizure in the setting of Covid presenting as status epilepticus. I would favor continued antiepileptic therapy, he is on Depakote at home(for behavioral reasons) and has been on Keppra while here simply because there is a shortage of IV Depakote. At this point, speech therapy is cleared him for meds with applesauce, and therefore I will order Depakote sprinkles. If he is tolerating this well, then on Sunday I would discontinue Keppra.  I think that his current confusion is not unexpected in someone with his degree of dementia and Covid.  Recommendations: 1) Depakote 500 twice daily 2) if tolerating Depakote well, discontinue Keppra on Sunday 3) neurology will be available on an as-needed basis moving forward.  Roland Rack, MD [facility] [phone]  If 7pm- 7am, please page neurology on call as listed in [place].

## 2019-08-01 NOTE — Progress Notes (Signed)
Pharmacy Antibiotic Note  [name] is a 85 y.o. male on day # 4 Unasyn for aspiration pneumonia.   BUN/creatinine have trended up some, but current regimen remains appropriate.     Also on day #4 of 5 Remdesivir for COVID+.  Plan:  Continue Unasyn 3 gm IV q8hrs.  Will follow renal function for any need to adjust regimen.  Follow up clinical progress, length of therapy.  Height: 5\' 4"  (162.6 cm) Weight: 119 lb 0.8 oz (54 kg)(Per recent visit <1 month ago) IBW/kg (Calculated) : 59.2  Temp (24hrs), Avg:97.8 F (36.6 C), Min:97.7 F (36.5 C), Max:97.9 F (36.6 C)  Recent Labs  Lab 07/28/19 1901 07/28/19 1901 07/29/19 1400 07/29/19 1644 07/30/19 0025 07/30/19 0546 07/30/19 1046 07/31/19 0610 08/01/19 0604  WBC 9.0   < > 11.7*  --   --  9.4 14.6* 15.0* 15.2*  CREATININE 1.07  --  1.21  --   --   --  1.03 1.32* 1.33*  LATICACIDVEN  --   --   --  4.1* 1.3  --   --   --   --    < > = values in this interval not displayed.    Estimated Creatinine Clearance: 32.1 mL/min (A) (by C-G formula based on SCr of 1.33 mg/dL (H)).    No Known Allergies  Antimicrobials this admission:  Unasyn 3/16>>  Remdesivir 3/16 >> (3/20)  Dose adjustments this admission:  n/a  Microbiology results:  3/16 COVID: positive  2/16 urine: multiple species, final  Thank you for allowing pharmacy to be a part of this patient's care.  Arty Baumgartner, [place] Phone: [phone] 08/01/2019 4:20 PM

## 2019-08-01 NOTE — Progress Notes (Signed)
PT Cancellation Note  Patient Details Name: [name] MRN: [id number] DOB: 1935/05/03   Cancelled Treatment:    Reason Eval/Treat Not Completed: Other (comment)(d/c plan is palliative care vs hospice)  Per secure message with OT and MD, okay to discharge therapy services as plan is for patient to d/c to SNF palliative care vs hospice.   Birdie Hopes 08/01/2019, 1:33 PM

## 2019-08-02 ENCOUNTER — Encounter (HOSPITAL_COMMUNITY): Payer: Self-pay | Admitting: Internal Medicine

## 2019-08-02 ENCOUNTER — Other Ambulatory Visit: Payer: Self-pay

## 2019-08-02 LAB — MRSA PCR SCREENING: MRSA by PCR: NEGATIVE

## 2019-08-02 LAB — COMPREHENSIVE METABOLIC PANEL
ALT: 26 U/L (ref 0–44)
AST: 36 U/L (ref 15–41)
Albumin: 2.2 g/dL — ABNORMAL LOW (ref 3.5–5.0)
Alkaline Phosphatase: 61 U/L (ref 38–126)
Anion gap: 14 (ref 5–15)
BUN: 34 mg/dL — ABNORMAL HIGH (ref 8–23)
CO2: 21 mmol/L — ABNORMAL LOW (ref 22–32)
Calcium: 8.2 mg/dL — ABNORMAL LOW (ref 8.9–10.3)
Chloride: 108 mmol/L (ref 98–111)
Creatinine, Ser: 1.47 mg/dL — ABNORMAL HIGH (ref 0.61–1.24)
GFR calc Af Amer: 50 mL/min — ABNORMAL LOW (ref >60)
GFR calc non Af Amer: 43 mL/min — ABNORMAL LOW (ref >60)
Glucose, Bld: 88 mg/dL (ref 70–99)
Potassium: 4.2 mmol/L (ref 3.5–5.1)
Sodium: 143 mmol/L (ref 135–145)
Total Bilirubin: 1.5 mg/dL — ABNORMAL HIGH (ref 0.3–1.2)
Total Protein: 5.8 g/dL — ABNORMAL LOW (ref 6.5–8.1)

## 2019-08-02 LAB — CBC WITH DIFFERENTIAL/PLATELET
Abs Immature Granulocytes: 0.17 10*3/uL — ABNORMAL HIGH (ref 0.00–0.07)
Basophils Absolute: 0 10*3/uL (ref 0.0–0.1)
Basophils Relative: 0 %
Eosinophils Absolute: 0 10*3/uL (ref 0.0–0.5)
Eosinophils Relative: 0 %
HCT: 40.8 % (ref 39.0–52.0)
Hemoglobin: 12.9 g/dL — ABNORMAL LOW (ref 13.0–17.0)
Immature Granulocytes: 1 %
Lymphocytes Relative: 13 %
Lymphs Abs: 2.1 10*3/uL (ref 0.7–4.0)
MCH: 30.2 pg (ref 26.0–34.0)
MCHC: 31.6 g/dL (ref 30.0–36.0)
MCV: 95.6 fL (ref 80.0–100.0)
Monocytes Absolute: 1.4 10*3/uL — ABNORMAL HIGH (ref 0.1–1.0)
Monocytes Relative: 9 %
Neutro Abs: 12.2 10*3/uL — ABNORMAL HIGH (ref 1.7–7.7)
Neutrophils Relative %: 77 %
Platelets: 242 10*3/uL (ref 150–400)
RBC: 4.27 MIL/uL (ref 4.22–5.81)
RDW: 12.6 % (ref 11.5–15.5)
WBC: 16 10*3/uL — ABNORMAL HIGH (ref 4.0–10.5)
nRBC: 0.6 % — ABNORMAL HIGH (ref 0.0–0.2)

## 2019-08-02 LAB — C-REACTIVE PROTEIN: CRP: 6.8 mg/dL — ABNORMAL HIGH (ref <1.0)

## 2019-08-02 LAB — D-DIMER, QUANTITATIVE: D-Dimer, Quant: 1.43 ug/mL-FEU — ABNORMAL HIGH (ref 0.00–0.50)

## 2019-08-02 LAB — GLUCOSE, CAPILLARY
Glucose-Capillary: 71 mg/dL (ref 70–99)
Glucose-Capillary: 75 mg/dL (ref 70–99)
Glucose-Capillary: 83 mg/dL (ref 70–99)
Glucose-Capillary: 91 mg/dL (ref 70–99)

## 2019-08-02 LAB — BRAIN NATRIURETIC PEPTIDE: B Natriuretic Peptide: 237.7 pg/mL — ABNORMAL HIGH (ref 0.0–100.0)

## 2019-08-02 LAB — MAGNESIUM: Magnesium: 2.1 mg/dL (ref 1.7–2.4)

## 2019-08-02 MED ORDER — SODIUM CHLORIDE 0.9 % IV SOLN
3.0000 g | Freq: Two times a day (BID) | INTRAVENOUS | Status: DC
  Administered 2019-08-02: 3 g via INTRAVENOUS
  Filled 2019-08-02: qty 3
  Filled 2019-08-02: qty 8

## 2019-08-02 MED ORDER — ORAL CARE MOUTH RINSE
15.0000 mL | Freq: Two times a day (BID) | OROMUCOSAL | Status: DC
  Administered 2019-08-02 – 2019-08-05 (×7): 15 mL via OROMUCOSAL

## 2019-08-02 NOTE — Progress Notes (Signed)
PROGRESS NOTE                                                                                                                                                                                                             Patient Demographics:    [name], is a 85 y.o. male, DOB - Oct 07, 1934, XTA:[id number]  Outpatient Primary MD for the patient is Sande Brothers, MD    LOS - 4  Admit date - 07/29/2019    Chief Complaint  Patient presents with  . Altered Mental Status       Brief Narrative   [name] is a 85 y.o. male with medical history significant of remote history of alcohol dependence, hypertension, seizure, dementia, presented with unresponsiveness, he lives at a memory care unit and was noted to be less responsive for 24 to 48 hours prior to hospital visit.  In the ER he had a visualized tonic-clonic seizure, he was also found to have COVID-19 pneumonia along with a suspicion of aspiration pneumonia.  He was seen by neurology and admitted for further care.   Subjective:   Patient in bed is awake, overall still confused in line with his severe underlying dementia, denies any headache or chest pain.  States he is not hungry and does not want to eat.   Assessment  & Plan :     1. Acute Hypoxic Resp. Failure due to Acute Covid 19 Viral Pneumonitis during the ongoing 2020 Covid 19 Pandemic - he has mild to moderate disease at best, has been started on steroids and remdesivir.  Will be monitored.  Not a candidate for Actemra use.  Once more alert he will be encouraged to sit up in chair in the daytime use I-S and flutter valve for pulmonary toiletry and then prone in bed when at night.   Recent Labs  Lab 07/29/19 1630 07/30/19 1046 07/31/19 0610 08/01/19 0604 08/02/19 0335  CRP  --  7.1* 5.4* 5.2* 6.8*  DDIMER  --  >20.00* 1.43* 1.63* 1.43*  BNP  --  173.3* 39.8 3,862.1* 237.7*  PROCALCITON  --  0.21  --   --   --    SARSCOV2NAA POSITIVE*  --   --   --   --     Hepatic Function Latest Ref Rng & Units 08/02/2019 08/01/2019 07/31/2019  Total  Protein 6.5 - 8.1 g/dL 5.8(L) 5.2(L) 5.5(L)  Albumin 3.5 - 5.0 g/dL 2.2(L) 2.0(L) 2.1(L)  AST 15 - 41 U/L 36 36 39  ALT 0 - 44 U/L '26 23 24  ' Alk Phosphatase 38 - 126 U/L 61 48 38  Total Bilirubin 0.3 - 1.2 mg/dL 1.5(H) 1.2 1.1  Bilirubin, Direct 0.0 - 0.3 mg/dL - - -      2.  Breakthrough seizures.  Question if due to his decreased mental status he was unable to take his oral antiseizure medication at home, CTA & MRI nonacute, neuro on board, as needed IV Ativan and IV Keppra for now.  Monitor closely.  3.  Possible aspiration pneumonia.  Unasyn.  Speech to evaluate once mentation improves.  Currently n.p.o.  4.  Hypertension.  Due to n.p.o. status placed on as needed hydralazine.    Patient has very poor baseline quality of life, extremely sick, discussed with daughter, plan is to continue present line of care for a short while, at any stage if it looks like he is suffering we will transition to full comfort measures.  No heroics to be done. Will involve palliative care as well, discussed with daughter who agrees.  He will finally be transition to either SNF with palliative care or residential hospice depending on his clinical course.    Condition - Extremely Guarded  Family Communication  :  Daughter [name] 07/30/19, 3/18 - [phone], 08/01/19  Code Status :  DNR  Diet :   Diet Order            DIET - DYS 1 Room service appropriate? No; Fluid consistency: Honey Thick  Diet effective now               Disposition Plan  : Stay in the hospital finished treatment for COVID-19 pneumonia, also being treated for breakthrough seizures.  Consults  :  Neuro  Procedures  :    EEG - nonspecific encephalopathy no ongoing seizures.  MRI Brain and CTA head and Neck - no acute changes  PUD Prophylaxis :    DVT Prophylaxis  :   Heparin   Lab Results    Component Value Date   PLT 242 08/02/2019    Inpatient Medications  Scheduled Meds: . divalproex  500 mg Oral Q12H  . heparin  5,000 Units Subcutaneous Q8H  . ipratropium  2 puff Inhalation Q6H  . mouth rinse  15 mL Mouth Rinse BID   Continuous Infusions: . sodium chloride 10 mL/hr at 08/02/19 0700  . ampicillin-sulbactam (UNASYN) IV Stopped (08/02/19 0309)  . levETIRAcetam Stopped (08/02/19 0554)  . remdesivir 100 mg in NS 100 mL Stopped (08/01/19 1900)   PRN Meds:.sodium chloride, acetaminophen **OR** acetaminophen, guaiFENesin, guaiFENesin-dextromethorphan, hydrALAZINE, latanoprost, LORazepam, [DISCONTINUED] ondansetron **OR** ondansetron (ZOFRAN) IV, Resource ThickenUp Clear  Antibiotics  :    Anti-infectives (From admission, onward)   Start     Dose/Rate Route Frequency Ordered Stop   07/30/19 1000  remdesivir 100 mg in sodium chloride 0.9 % 100 mL IVPB  Status:  Discontinued     100 mg 200 mL/hr over 30 Minutes Intravenous Daily 07/29/19 1847 07/29/19 1849   07/30/19 1000  remdesivir 100 mg in sodium chloride 0.9 % 100 mL IVPB     100 mg 200 mL/hr over 30 Minutes Intravenous Daily 07/29/19 1851 08/03/19 0959   07/30/19 0200  Ampicillin-Sulbactam (UNASYN) 3 g in sodium chloride 0.9 % 100 mL IVPB     3 g 200 mL/hr  over 30 Minutes Intravenous Every 8 hours 07/29/19 1710     07/29/19 1900  remdesivir 200 mg in sodium chloride 0.9% 250 mL IVPB  Status:  Discontinued     200 mg 580 mL/hr over 30 Minutes Intravenous Once 07/29/19 1847 07/29/19 1849   07/29/19 1900  remdesivir 100 mg in sodium chloride 0.9 % 100 mL IVPB     100 mg 200 mL/hr over 30 Minutes Intravenous Every 30 min 07/29/19 1851 07/29/19 2218   07/29/19 1615  Ampicillin-Sulbactam (UNASYN) 3 g in sodium chloride 0.9 % 100 mL IVPB     3 g 200 mL/hr over 30 Minutes Intravenous  Once 07/29/19 1611 07/29/19 1928       Time Spent in minutes  30   Lala Lund M.D on 08/02/2019 at 10:27 AM  To page go to  [email or web address] - password [facility]  [facility] -  Office  [phone]   See all Orders from today for further details    Objective:   Vitals:   08/02/19 0257 08/02/19 0420 08/02/19 0425 08/02/19 0800  BP: 138/76  118/75 (!) 141/68  Pulse:  (!) 58 63 (!) 53  Resp: 17  16   Temp:   97.7 F (36.5 C) 98.4 F (36.9 C)  TempSrc:   Oral Axillary  SpO2: 96% 95% 96% 95%  Weight:      Height:        Wt Readings from Last 3 Encounters:  07/29/19 54 kg  07/08/19 54 kg  11/11/18 57.5 kg     Intake/Output Summary (Last 24 hours) at 08/02/2019 1027 Last data filed at 08/02/2019 0700 Gross per 24 hour  Intake 758.3 ml  Output 925 ml  Net -166.7 ml     Physical Exam  Extremely weak and frail elderly African-American male in hospital bed in no distress, he is awake more alert today, answers a few questions, denies any headache, says he is not hungry, moves all 4 extremities by himself [place].AT,PERRAL Supple Neck,No JVD, No cervical lymphadenopathy appriciated.  Symmetrical Chest wall movement, Good air movement bilaterally, CTAB RRR,No Gallops, Rubs or new Murmurs, No Parasternal Heave +ve B.Sounds, Abd Soft, No tenderness, No organomegaly appriciated, No rebound - guarding or rigidity. No Cyanosis, Clubbing or edema, No new Rash or bruise     Data Review:    CBC Recent Labs  Lab 07/29/19 1400 07/29/19 1415 07/30/19 0546 07/30/19 1046 07/31/19 0610 08/01/19 0604 08/02/19 0335  WBC 11.7*   < > 9.4 14.6* 15.0* 15.2* 16.0*  HGB 13.6   < > 13.1 13.8 11.5* 11.7* 12.9*  HCT 44.8   < > 41.6 44.3 36.2* 36.1* 40.8  PLT 292   < > 340 238 220 203 242  MCV 100.9*   < > 97.2 97.1 95.5 95.5 95.6  MCH 30.6   < > 30.6 30.3 30.3 31.0 30.2  MCHC 30.4   < > 31.5 31.2 31.8 32.4 31.6  RDW 12.2   < > 12.9 12.1 12.3 12.5 12.6  LYMPHSABS 3.2  --  0.8  --  0.9 1.0 2.1  MONOABS 0.9  --  0.2  --  0.8 1.2* 1.4*  EOSABS 0.0  --  0.0  --  0.0 0.0 0.0  BASOSABS 0.1  --  0.0  --  0.0 0.0 0.0     < > = values in this interval not displayed.    [place]  Lab 07/29/19 1400 07/29/19 1400 07/29/19 1415 07/29/19 1415 07/29/19 1828  07/30/19 1046 07/31/19 0610 08/01/19 0604 08/02/19 0335  NA 139   < > 138   < > 140 144 144 144 143  K 4.3   < > 5.1   < > 4.0 4.2 3.9 4.5 4.2  CL 99  --   --   --   --  101 106 110 108  CO2 19*  --   --   --   --  '25 26 23 ' 21*  GLUCOSE 129*  --   --   --   --  104* 142* 133* 88  BUN 19  --   --   --   --  19 31* 33* 34*  CREATININE 1.21  --   --   --   --  1.03 1.32* 1.33* 1.47*  CALCIUM 9.0  --   --   --   --  9.0 8.2* 7.9* 8.2*  AST 44*  --   --   --   --   --  39 36 36  ALT 26  --   --   --   --   --  '24 23 26  ' ALKPHOS 52  --   --   --   --   --  38 48 61  BILITOT 1.8*  --   --   --   --   --  1.1 1.2 1.5*  MG  --   --   --   --   --  1.7 1.5* 2.2 2.1  AMMONIA  --   --  29  --   --   --   --   --   --    < > = values in this interval not displayed.     ------------------------------------------------------------------------------------------------------------------ No results for input(s): CHOL, HDL, LDLCALC, TRIG, CHOLHDL, LDLDIRECT in the last 72 hours.  Lab Results  Component Value Date   HGBA1C 5.1 12/14/2014   ------------------------------------------------------------------------------------------------------------------ No results for input(s): TSH, T4TOTAL, T3FREE, THYROIDAB in the last 72 hours.  Invalid input(s): FREET3  Cardiac Enzymes No results for input(s): CKMB, TROPONINI, MYOGLOBIN in the last 168 hours.  Invalid input(s): CK ------------------------------------------------------------------------------------------------------------------    Component Value Date/Time   BNP 237.7 (H) 08/02/2019 0335    Micro Results Recent Results (from the past 240 hour(s))  Urine culture     Status: Abnormal   Collection Time: 07/29/19  6:09 AM   Specimen: Urine, Random  Result Value Ref Range Status    Specimen Description URINE, RANDOM  Final   Special Requests   Final    NONE Performed at [facility], [address] [place], [place] [address]    Culture MULTIPLE SPECIES PRESENT, SUGGEST RECOLLECTION (A)  Final   Report Status 07/30/2019 FINAL  Final  Respiratory Panel by RT PCR (Flu A&B, Covid) - Nasopharyngeal Swab     Status: Abnormal   Collection Time: 07/29/19  4:30 PM   Specimen: Nasopharyngeal Swab  Result Value Ref Range Status   SARS Coronavirus 2 by RT PCR POSITIVE (A) NEGATIVE Final    Comment: RESULT CALLED TO, READ BACK BY AND VERIFIED WITHRhona Leavens RN 1817 07/29/19 A BROWNING (NOTE) SARS-CoV-2 target nucleic acids are DETECTED. SARS-CoV-2 RNA is generally detectable in upper respiratory specimens  during the acute phase of infection. Positive results are indicative of the presence of the identified virus, but do not rule out bacterial infection or co-infection with other pathogens not detected by the test. Clinical correlation with  patient history and other diagnostic information is necessary to determine patient infection status. The expected result is Negative. Fact Sheet for Patients:  PinkCheek.be Fact Sheet for Healthcare Providers: GravelBags.it This test is not yet approved or cleared by the [place] FDA and  has been authorized for detection and/or diagnosis of SARS-CoV-2 by FDA under an Emergency Use Authorization (EUA).  This EUA will remain in effect (meaning this test can be used) for  the duration of  the COVID-19 declaration under Section 564(b)(1) of the Act, 21 U.S.C. section 360bbb-3(b)(1), unless the authorization is terminated or revoked sooner.    Influenza A by PCR NEGATIVE NEGATIVE Final   Influenza B by PCR NEGATIVE NEGATIVE Final    Comment: (NOTE) The Xpert Xpress SARS-CoV-2/FLU/RSV assay is intended as an aid in  the diagnosis of influenza from Nasopharyngeal swab  specimens and  should not be used as a sole basis for treatment. Nasal washings and  aspirates are unacceptable for Xpert Xpress SARS-CoV-2/FLU/RSV  testing. Fact Sheet for Patients: PinkCheek.be Fact Sheet for Healthcare Providers: GravelBags.it This test is not yet approved or cleared by the [place] FDA and  has been authorized for detection and/or diagnosis of SARS-CoV-2 by  FDA under an Emergency Use Authorization (EUA). This EUA will remain  in effect (meaning this test can be used) for the duration of the  Covid-19 declaration under Section 564(b)(1) of the Act, 21  U.S.C. section 360bbb-3(b)(1), unless the authorization is  terminated or revoked. Performed at [facility], [place] [address], [place], [place] [address]   MRSA PCR Screening     Status: None   Collection Time: 08/02/19  5:39 AM   Specimen: Nasal Mucosa; Nasopharyngeal  Result Value Ref Range Status   MRSA by PCR NEGATIVE NEGATIVE Final    Comment:        The GeneXpert MRSA Assay (FDA approved for NASAL specimens only), is one component of a comprehensive MRSA colonization surveillance program. It is not intended to diagnose MRSA infection nor to guide or monitor treatment for MRSA infections. Performed at [facility], [place] [address] [place], [place] [address]     Radiology Reports EEG  Result Date: 07/29/2019 Lora Havens, MD     07/29/2019  4:15 PM Patient Name: Freedom Peddy MRN: [id number] Epilepsy Attending: Lora Havens Referring Physician/Provider: Etta Quill, PA Date: 07/29/2019 Duration: 26.07 minutes Patient history: 85 year old male with history of alcohol withdrawal seizures, dementia who presented after seizure-like episode.  EEG to evaluate for seizures. Level of alertness: Lethargic AEDs during EEG study: Ativan, Depakote Technical aspects: This EEG study was done with scalp electrodes positioned according to the  10-20 International system of electrode placement. Electrical activity was acquired at a sampling rate of '500Hz'  and reviewed with a high frequency filter of '70Hz'  and a low frequency filter of '1Hz' . EEG data were recorded continuously and digitally stored. Description: No clear posterior dominant was seen.  EEG showed continuous generalized low amplitude 2 to 3 Hz delta slowing.  Hyperventilation and photic stimulation were not performed.  Of note, EEG was technically difficult secondary to significant myogenic artifact. Abnormality - Continuous slow, generalized IMPRESSION: This technically difficult study is suggestive of moderate to severe diffuse encephalopathy, nonspecific to etiology. No seizures or epileptiform discharges were seen throughout the recording. Lora Havens   CT Angio Head W or Wo Contrast  Result Date: 07/29/2019 CLINICAL DATA:  Encephalopathy. Additional history provided: Unresponsive episode, at renal respirations, CPR,  seizure EN route EXAM: CT ANGIOGRAPHY HEAD AND NECK TECHNIQUE: Multidetector CT imaging of the head and neck was performed using the standard protocol during bolus administration of intravenous contrast. Multiplanar CT image reconstructions and MIPs were obtained to evaluate the vascular anatomy. Carotid stenosis measurements (when applicable) are obtained utilizing NASCET criteria, using the distal internal carotid diameter as the denominator. CONTRAST:  22m OMNIPAQUE IOHEXOL 350 MG/ML SOLN COMPARISON:  Noncontrast head CT 07/29/2019 FINDINGS: CTA NECK FINDINGS The examination is mildly motion degraded. Aortic arch: Common origin of the innominate and left common carotid arteries. The visualized aortic arch is unremarkable. No innominate or proximal subclavian artery stenosis. Right carotid system: CCA and ICA patent within the neck without stenosis. Left carotid system: CCA and ICA patent within the neck without stenosis. Vertebral arteries: The right vertebral artery  is dominant. The vertebral arteries are patent within the neck bilaterally without stenosis. Skeleton: No acute bony abnormality or aggressive osseous lesion identified. Cervical spondylosis without high-grade bony spinal canal narrowing. Other neck: No neck mass or cervical lymphadenopathy. Upper chest: No consolidation within the imaged lung apices. Review of the MIP images confirms the above findings CTA HEAD FINDINGS Anterior circulation: The intracranial internal carotid arteries are patent without stenosis. The M1 middle cerebral arteries are patent without significant stenosis. No M2 proximal branch occlusion or high-grade proximal stenosis is identified. The anterior cerebral arteries are patent without high-grade proximal stenosis. No intracranial aneurysm is identified. Posterior circulation: The dominant intracranial right vertebral artery is patent without stenosis and supplies the basilar artery. The non dominant intracranial left vertebral artery is markedly diminutive beyond the origin of the left PICA, although faintly patent. The basilar artery is patent without significant stenosis. Predominantly fetal origin of the right posterior cerebral artery which is patent without significant proximal stenosis. The left posterior cerebral artery is patent without significant proximal stenosis. A small left posterior communicating artery is present. Venous sinuses: Within limitations of contrast timing, no convincing thrombus. Anatomic variants: As described Review of the MIP images confirms the above findings IMPRESSION: CTA neck: The bilateral common carotid, internal carotid and vertebral arteries are patent within the neck without stenosis. The right vertebral artery is dominant. CTA head: 1. No intracranial large vessel occlusion. 2. The intracranial left vertebral artery is markedly diminutive beyond the origin of the left PICA. This is suspected largely on a developmental basis given significant non  dominance. A significant V4 left vertebral artery stenosis cannot be definitively excluded. The dominant intracranial right vertebral artery is widely patent and supplies the basilar artery. 3. Otherwise, no evidence of proximal high-grade arterial stenosis. Electronically Signed   By: KKellie SimmeringDO   On: 07/29/2019 15:23   CT HEAD WO CONTRAST  Result Date: 07/29/2019 CLINICAL DATA:  Encephalopathy. EXAM: CT HEAD WITHOUT CONTRAST TECHNIQUE: Contiguous axial images were obtained from the base of the skull through the vertex without intravenous contrast. COMPARISON:  Head CT 03/07/2015 FINDINGS: Brain: There is no evidence of acute intracranial hemorrhage, intracranial mass, midline shift or extra-axial fluid collection.No demarcated cortical infarction. Moderate ill-defined hypoattenuation within the cerebral white matter is similar to prior examination 03/07/2015 and nonspecific, but consistent with chronic small vessel ischemic disease. Moderate generalized parenchymal atrophy has progressed. Vascular: No hyperdense vessel.  Atherosclerotic calcifications. Skull: Normal. Negative for fracture or focal lesion. Sinuses/Orbits: Visualized orbits demonstrate no acute abnormality. No significant paranasal sinus disease or mastoid effusion at the imaged levels. Other: Partially visualized support tubes. IMPRESSION: No evidence of acute intracranial  abnormality. Moderate chronic small vessel ischemic disease, stable as compared to head CT 03/07/2015. Moderate generalized parenchymal atrophy has progressed. Electronically Signed   By: Kellie Simmering DO   On: 07/29/2019 14:16   CT Angio Neck W and/or Wo Contrast  Result Date: 07/29/2019 CLINICAL DATA:  Encephalopathy. Additional history provided: Unresponsive episode, at renal respirations, CPR, seizure EN route EXAM: CT ANGIOGRAPHY HEAD AND NECK TECHNIQUE: Multidetector CT imaging of the head and neck was performed using the standard protocol during bolus  administration of intravenous contrast. Multiplanar CT image reconstructions and MIPs were obtained to evaluate the vascular anatomy. Carotid stenosis measurements (when applicable) are obtained utilizing NASCET criteria, using the distal internal carotid diameter as the denominator. CONTRAST:  66m OMNIPAQUE IOHEXOL 350 MG/ML SOLN COMPARISON:  Noncontrast head CT 07/29/2019 FINDINGS: CTA NECK FINDINGS The examination is mildly motion degraded. Aortic arch: Common origin of the innominate and left common carotid arteries. The visualized aortic arch is unremarkable. No innominate or proximal subclavian artery stenosis. Right carotid system: CCA and ICA patent within the neck without stenosis. Left carotid system: CCA and ICA patent within the neck without stenosis. Vertebral arteries: The right vertebral artery is dominant. The vertebral arteries are patent within the neck bilaterally without stenosis. Skeleton: No acute bony abnormality or aggressive osseous lesion identified. Cervical spondylosis without high-grade bony spinal canal narrowing. Other neck: No neck mass or cervical lymphadenopathy. Upper chest: No consolidation within the imaged lung apices. Review of the MIP images confirms the above findings CTA HEAD FINDINGS Anterior circulation: The intracranial internal carotid arteries are patent without stenosis. The M1 middle cerebral arteries are patent without significant stenosis. No M2 proximal branch occlusion or high-grade proximal stenosis is identified. The anterior cerebral arteries are patent without high-grade proximal stenosis. No intracranial aneurysm is identified. Posterior circulation: The dominant intracranial right vertebral artery is patent without stenosis and supplies the basilar artery. The non dominant intracranial left vertebral artery is markedly diminutive beyond the origin of the left PICA, although faintly patent. The basilar artery is patent without significant stenosis.  Predominantly fetal origin of the right posterior cerebral artery which is patent without significant proximal stenosis. The left posterior cerebral artery is patent without significant proximal stenosis. A small left posterior communicating artery is present. Venous sinuses: Within limitations of contrast timing, no convincing thrombus. Anatomic variants: As described Review of the MIP images confirms the above findings IMPRESSION: CTA neck: The bilateral common carotid, internal carotid and vertebral arteries are patent within the neck without stenosis. The right vertebral artery is dominant. CTA head: 1. No intracranial large vessel occlusion. 2. The intracranial left vertebral artery is markedly diminutive beyond the origin of the left PICA. This is suspected largely on a developmental basis given significant non dominance. A significant V4 left vertebral artery stenosis cannot be definitively excluded. The dominant intracranial right vertebral artery is widely patent and supplies the basilar artery. 3. Otherwise, no evidence of proximal high-grade arterial stenosis. Electronically Signed   By: KKellie SimmeringDO   On: 07/29/2019 15:23   CT Chest Wo Contrast  Result Date: 07/15/2019 CLINICAL DATA:  Chronic dyspnea. EXAM: CT CHEST WITHOUT CONTRAST TECHNIQUE: Multidetector CT imaging of the chest was performed following the standard protocol without IV contrast. COMPARISON:  September 09, 2014. FINDINGS: Cardiovascular: No significant vascular findings. Normal heart size. No pericardial effusion. Mediastinum/Nodes: No enlarged mediastinal or axillary lymph nodes. Thyroid gland, trachea, and esophagus demonstrate no significant findings. Lungs/Pleura: No pneumothorax or pleural effusion is noted.  Left lung is clear. Mild right posterior basilar subsegmental atelectasis is noted. Upper Abdomen: Cholelithiasis is noted. Musculoskeletal: No chest wall mass or suspicious bone lesions identified. IMPRESSION: 1. Mild right  posterior basilar subsegmental atelectasis. 2. Cholelithiasis. Electronically Signed   By: Marijo Conception M.D.   On: 07/15/2019 13:38   MR BRAIN WO CONTRAST  Result Date: 07/29/2019 CLINICAL DATA:  Seizure with subsequent mental status changes. EXAM: MRI HEAD WITHOUT CONTRAST TECHNIQUE: Multiplanar, multiecho pulse sequences of the brain and surrounding structures were obtained without intravenous contrast. COMPARISON:  CT studies earlier same day. FINDINGS: Brain: Diffusion imaging does not show any acute or subacute infarction. There is generalized brain atrophy. No focal abnormality affects the brainstem or cerebellum. The cerebral hemispheres show chronic small-vessel ischemic changes of the white matter. No large vessel territory infarction. No mass lesion, hemorrhage, hydrocephalus or extra-axial collection. No focal mesial temporal lesion. There is considerable temporal lobe atrophy. Vascular: Major vessels at the base of the brain show flow. Skull and upper cervical spine: Negative Sinuses/Orbits: Clear/normal Other: None IMPRESSION: No acute or reversible finding. Generalized brain atrophy, with some temporal lobe predominance. Chronic small-vessel ischemic changes of the cerebral hemispheric white matter. Electronically Signed   By: Nelson Chimes M.D.   On: 07/29/2019 18:13   DG Chest Port 1 View  Result Date: 08/01/2019 CLINICAL DATA:  Shortness of breath EXAM: PORTABLE CHEST 1 VIEW COMPARISON:  July 30, 2019 FINDINGS: A portion of the right apex is obscured by the patient's mandible. There is ill-defined opacity in each lung base, a new finding. Heart size and pulmonary vascular normal. No adenopathy. Old healed rib fractures on the left again noted. Underlying osteoporosis noted. IMPRESSION: Ill-defined opacity in the lung bases, concerning for early pneumonia in these regions. Aspiration could present similarly. Lungs elsewhere clear. Heart size normal. Bones osteoporotic. Electronically  Signed   By: Lowella Grip III M.D.   On: 08/01/2019 08:40   DG Chest Port 1 View  Result Date: 07/30/2019 CLINICAL DATA:  Shortness of breath, COVID symptoms EXAM: PORTABLE CHEST 1 VIEW COMPARISON:  07/29/2019 FINDINGS: The heart size and mediastinal contours are within normal limits. Both lungs are clear. Callused fracture deformities of the left fifth and sixth ribs again noted. Acute fractures are not appreciated. IMPRESSION: No acute abnormality of the lungs in AP portable projection. Electronically Signed   By: Eddie Candle M.D.   On: 07/30/2019 08:39   DG Chest Port 1 View  Result Date: 07/29/2019 CLINICAL DATA:  Altered level of consciousness, CPR EXAM: PORTABLE CHEST 1 VIEW COMPARISON:  07/29/2019 at 0211 hours FINDINGS: The heart size and mediastinal contours are within normal limits. Linear right basilar atelectasis. Lungs otherwise clear. Probable nondisplaced rib fractures involving the lateral aspects of the left third, fourth, and fifth ribs. No pneumothorax. IMPRESSION: Probable nondisplaced rib fractures involving the lateral aspects of the left third, fourth, and fifth ribs. No pneumothorax. Electronically Signed   By: Davina Poke D.O.   On: 07/29/2019 14:03   DG Chest Port 1 View  Result Date: 07/29/2019 CLINICAL DATA:  85 year old male with shortness of breath. EXAM: PORTABLE CHEST 1 VIEW COMPARISON:  Chest radiograph dated 07/15/2019. FINDINGS: No focal consolidation, pleural effusion or pneumothorax. Right lung base atelectasis/scarring. The cardiac silhouette is within normal limits. Osteopenia with degenerative changes of the spine. No acute osseous pathology. IMPRESSION: No acute cardiopulmonary process. Electronically Signed   By: Anner Crete M.D.   On: 07/29/2019 02:37   DG Chest  Port 1 View  Result Date: 07/15/2019 CLINICAL DATA:  Increased difficulty breathing, cough and congestion for 2 weeks, history dementia, hypertension, colon cancer, former smoker EXAM:  PORTABLE CHEST 1 VIEW COMPARISON:  Portable exam 1017 hours compared to 07/08/2019 FINDINGS: Normal heart size, mediastinal contours, and pulmonary vascularity. Subsegmental atelectasis at RIGHT base unchanged. Remaining lungs clear. No pleural effusion or pneumothorax. Bones demineralized. IMPRESSION: Chronic RIGHT basilar atelectasis without acute infiltrate. Electronically Signed   By: Lavonia Dana M.D.   On: 07/15/2019 11:01   DG Chest Port 1 View  Result Date: 07/08/2019 CLINICAL DATA:  Shortness of breath EXAM: PORTABLE CHEST 1 VIEW COMPARISON:  11/07/2018 FINDINGS: The heart size and mediastinal contours are within normal limits. No focal airspace consolidation, pleural effusion, or pneumothorax. The visualized skeletal structures are unremarkable. IMPRESSION: No active disease. Electronically Signed   By: Davina Poke D.O.   On: 07/08/2019 10:39   DG Abd Portable 1V  Result Date: 07/30/2019 CLINICAL DATA:  Altered mental status. Short of breath. Constipation. EXAM: PORTABLE ABDOMEN - 1 VIEW COMPARISON:  CT, 06/08/2018 FINDINGS: Normal bowel gas pattern.  No significant increase colonic stool. No evidence of renal ureteral stones. Soft tissues are unremarkable. Stable right hip hemiarthroplasty.  No acute skeletal abnormality. IMPRESSION: 1. No acute findings.  No evidence of bowel obstruction. 2. No significant increase colonic stool burden. Electronically Signed   By: Lajean Manes M.D.   On: 07/30/2019 09:24   Overnight EEG with video  Result Date: 07/31/2019 Lora Havens, MD     07/31/2019 12:01 PM Patient Name: Fleetwood Pierron MRN: [id number] Epilepsy Attending: Lora Havens Referring Physician/Provider: D Duration: 07/30/2019 1531 to 07/31/2019 1027 Duration of study: 19 hours  Patient history: 85 year old male with history of alcohol withdrawal seizures, dementia who presented after seizure-like episode.  EEG to evaluate for seizures.  Level of alertness: Lethargic, asleep  AEDs  during EEG study: Keppra  Technical aspects: This EEG study was done with scalp electrodes positioned according to the 10-20 International system of electrode placement. Electrical activity was acquired at a sampling rate of '500Hz'  and reviewed with a high frequency filter of '70Hz'  and a low frequency filter of '1Hz' . EEG data were recorded continuously and digitally stored.  Description: During awake state, no clear posterior dominant was seen.  Sleep was characterized by vertex waves, sleep spindles (12 to 14 Hz), maximal frontocentral. EEG showed continuous generalized 3 to 6 Hz theta-delta slowing.  Hyperventilation and photic stimulation were not performed.   Abnormality - Continuous slow, generalized  IMPRESSION: This study is suggestive of moderate diffuse encephalopathy, nonspecific to etiology. No seizures or epileptiform discharges were seen throughout the recording.  Lora Havens   ECHOCARDIOGRAM LIMITED  Result Date: 07/30/2019    ECHOCARDIOGRAM LIMITED REPORT   Patient Name:   MALEKE FERIA Date of Exam: 07/30/2019 Medical Rec #:  [id number]  Height:       64.0 in Accession #:    [id number] Weight:       119.0 lb Date of Birth:  04-03-1935   BSA:          1.569 m Patient Age:    56 years   BP:           116/74 mmHg Patient Gender: M          HR:           68 bpm. Exam Location:  Inpatient Procedure: Limited Echo Indications:    786.09 dyspnea  History:  Patient has no prior history of Echocardiogram examinations.                 Risk Factors:Hypertension. Hx of alcohol dependence. Covid +.  Sonographer:    Jannett Celestine RDCS (AE) Referring Phys: [id number] Lequita Halt  Sonographer Comments: No parasternal window. patient in a constricted position (very limited mobility). IMPRESSIONS  1. Technically difficult study with limited views  2. Left ventricular ejection fraction, by estimation, is 65 to 70%. The left ventricle has normal function.  3. Right ventricle is not well visualized  4. The mitral  valve is normal in structure. No evidence of mitral valve regurgitation.  5. The aortic valve was not well visualized. Aortic valve regurgitation is not visualized. No aortic stenosis is present.  6. The inferior vena cava is normal in size with greater than 50% respiratory variability, suggesting right atrial pressure of 3 mmHg. FINDINGS  Left Ventricle: Left ventricular ejection fraction, by estimation, is 65 to 70%. The left ventricle has normal function. Right Ventricle: The right ventricular size is not well visualized. Right ventricular systolic function was not well visualized. Pericardium: Trivial pericardial effusion is present. Mitral Valve: The mitral valve is normal in structure. Tricuspid Valve: The tricuspid valve is not well visualized. Aortic Valve: The aortic valve was not well visualized. Aortic valve regurgitation is not visualized. No aortic stenosis is present. Pulmonic Valve: The pulmonic valve was not well visualized. Venous: The inferior vena cava is normal in size with greater than 50% respiratory variability, suggesting right atrial pressure of 3 mmHg. Oswaldo Milian MD Electronically signed by Oswaldo Milian MD Signature Date/Time: 07/30/2019/8:17:32 PM    Final

## 2019-08-02 NOTE — Progress Notes (Signed)
Patient has flat affect, and not following most commands but is cooperative with assessment and meds crushed in applesauce. Patient refusing to drink fluids at this time. Patient repositioned. Patient resting in bed at this time, bed alarm on and call light within reach. Will continue to monitor.

## 2019-08-02 NOTE — Progress Notes (Signed)
Patient's CBG 75, so 120 ml of honey thick apple juice given. Patient needed encouragement, but did finish drink. Patient only coughed once while drinking, sitting at 45 degrees in bed. Will continue to monitor.

## 2019-08-02 NOTE — Plan of Care (Signed)
Problem: Education: Goal: Knowledge of General Education information will improve Description: Including pain rating scale, medication(s)/side effects and non-pharmacologic comfort measures 08/02/2019 1135 by Lacey Jensen, RN Outcome: Not Applicable [id number] by Lacey Jensen, RN Outcome: Progressing   Problem: Health Behavior/Discharge Planning: Goal: Ability to manage health-related needs will improve 08/02/2019 1135 by Lacey Jensen, RN Outcome: Not Applicable [id number] by Lacey Jensen, RN Outcome: Progressing   Problem: Clinical Measurements: Goal: Ability to maintain clinical measurements within normal limits will improve 08/02/2019 1135 by Lacey Jensen, RN Outcome: Progressing 08/02/2019 1134 by Lacey Jensen, RN Outcome: Progressing Goal: Will remain free from infection 08/02/2019 1135 by Lacey Jensen, RN Outcome: Progressing 08/02/2019 1134 by Lacey Jensen, RN Outcome: Progressing Goal: Diagnostic test results will improve 08/02/2019 1135 by Lacey Jensen, RN Outcome: Progressing 08/02/2019 1134 by Lacey Jensen, RN Outcome: Progressing Goal: Respiratory complications will improve 08/02/2019 1135 by Lacey Jensen, RN Outcome: Progressing 08/02/2019 1134 by Lacey Jensen, RN Outcome: Progressing Goal: Cardiovascular complication will be avoided 08/02/2019 1135 by Lacey Jensen, RN Outcome: Progressing 08/02/2019 1134 by Lacey Jensen, RN Outcome: Progressing   Problem: Activity: Goal: Risk for activity intolerance will decrease 08/02/2019 1135 by Lacey Jensen, RN Outcome: Not Progressing 08/02/2019 1134 by Lacey Jensen, RN Outcome: Progressing   Problem: Nutrition: Goal: Adequate nutrition will be maintained 08/02/2019 1135 by Lacey Jensen, RN Outcome: Not Progressing 08/02/2019 1134 by Lacey Jensen, RN Outcome: Progressing    Problem: Coping: Goal: Level of anxiety will decrease 08/02/2019 1135 by Lacey Jensen, RN Outcome: Progressing 08/02/2019 1134 by Lacey Jensen, RN Outcome: Progressing   Problem: Elimination: Goal: Will not experience complications related to bowel motility 08/02/2019 1135 by Lacey Jensen, RN Outcome: Progressing 08/02/2019 1134 by Lacey Jensen, RN Outcome: Progressing Goal: Will not experience complications related to urinary retention 08/02/2019 1135 by Lacey Jensen, RN Outcome: Progressing 08/02/2019 1134 by Lacey Jensen, RN Outcome: Progressing   Problem: Pain Managment: Goal: General experience of comfort will improve 08/02/2019 1135 by Lacey Jensen, RN Outcome: Progressing 08/02/2019 1134 by Lacey Jensen, RN Outcome: Progressing   Problem: Safety: Goal: Ability to remain free from injury will improve 08/02/2019 1135 by Lacey Jensen, RN Outcome: Progressing 08/02/2019 1134 by Lacey Jensen, RN Outcome: Progressing   Problem: Skin Integrity: Goal: Risk for impaired skin integrity will decrease 08/02/2019 1135 by Lacey Jensen, RN Outcome: Progressing 08/02/2019 1134 by Lacey Jensen, RN Outcome: Progressing   Problem: Education: Goal: Knowledge of risk factors and measures for prevention of condition will improve 08/02/2019 1135 by Lacey Jensen, RN Outcome: Progressing 08/02/2019 1134 by Lacey Jensen, RN Outcome: Progressing   Problem: Coping: Goal: Psychosocial and spiritual needs will be supported 08/02/2019 1135 by Lacey Jensen, RN Outcome: Progressing 08/02/2019 1134 by Lacey Jensen, RN Outcome: Progressing   Problem: Respiratory: Goal: Will maintain a patent airway 08/02/2019 1135 by Lacey Jensen, RN Outcome: Progressing 08/02/2019 1134 by Lacey Jensen, RN Outcome: Progressing Goal: Complications related to the disease  process, condition or treatment will be avoided or minimized 08/02/2019 1135 by Lacey Jensen, RN Outcome: Progressing 08/02/2019 1134 by Lacey Jensen, RN Outcome: Progressing   Problem: Education: Goal: Expressions of having a comfortable level of knowledge regarding the disease process will increase 08/02/2019 1135 by Lacey Jensen, RN Outcome: Progressing 08/02/2019 1134 by Ozella Almond  A, RN Outcome: Progressing   Problem: Coping: Goal: Ability to adjust to condition or change in health will improve 08/02/2019 1135 by Lacey Jensen, RN Outcome: Progressing 08/02/2019 1134 by Lacey Jensen, RN Outcome: Progressing Goal: Ability to identify appropriate support needs will improve 08/02/2019 1135 by Lacey Jensen, RN Outcome: Progressing 08/02/2019 1134 by Lacey Jensen, RN Outcome: Progressing   Problem: Health Behavior/Discharge Planning: Goal: Compliance with prescribed medication regimen will improve 08/02/2019 1135 by Lacey Jensen, RN Outcome: Progressing 08/02/2019 1134 by Lacey Jensen, RN Outcome: Progressing   Problem: Medication: Goal: Risk for medication side effects will decrease 08/02/2019 1135 by Lacey Jensen, RN Outcome: Progressing 08/02/2019 1134 by Lacey Jensen, RN Outcome: Progressing   Problem: Clinical Measurements: Goal: Complications related to the disease process, condition or treatment will be avoided or minimized 08/02/2019 1135 by Lacey Jensen, RN Outcome: Progressing 08/02/2019 1134 by Lacey Jensen, RN Outcome: Progressing Goal: Diagnostic test results will improve 08/02/2019 1135 by Lacey Jensen, RN Outcome: Progressing 08/02/2019 1134 by Lacey Jensen, RN Outcome: Progressing   Problem: Safety: Goal: Verbalization of understanding the information provided will improve 08/02/2019 1135 by Lacey Jensen, RN Outcome:  Progressing 08/02/2019 1134 by Lacey Jensen, RN Outcome: Progressing   Problem: Self-Concept: Goal: Level of anxiety will decrease 08/02/2019 1135 by Lacey Jensen, RN Outcome: Progressing 08/02/2019 1134 by Lacey Jensen, RN Outcome: Progressing Goal: Ability to verbalize feelings about condition will improve 08/02/2019 1135 by Lacey Jensen, RN Outcome: Progressing 08/02/2019 1134 by Lacey Jensen, RN Outcome: Progressing   Problem: Self-Concept: Goal: Level of anxiety will decrease 08/02/2019 1135 by Lacey Jensen, RN Outcome: Progressing 08/02/2019 1134 by Lacey Jensen, RN Outcome: Progressing Goal: Ability to verbalize feelings about condition will improve 08/02/2019 1135 by Lacey Jensen, RN Outcome: Progressing 08/02/2019 1134 by Lacey Jensen, RN Outcome: Progressing

## 2019-08-02 NOTE — Progress Notes (Signed)
Pharmacy Antibiotic Note  [name] is a 85 y.o. male on day # 5 Unasyn for aspiration pneumonia.    BUN/creatinine continue to trend up with current CrCl 29 mL/min. Will reduce Unasyn to q12h regimen and monitor SCr.     Also on day #5 of 5 Remdesivir for COVID+.   Plan:  Reduce Unasyn 3 gm IV q12hrs.  Will follow renal function for any need to adjust regimen.  Follow up clinical progress, length of therapy.  Height: 5\' 4"  (162.6 cm) Weight: 119 lb 0.8 oz (54 kg)(Per recent visit <1 month ago) IBW/kg (Calculated) : 59.2  Temp (24hrs), Avg:98 F (36.7 C), Min:97.7 F (36.5 C), Max:98.4 F (36.9 C)  Recent Labs  Lab 07/29/19 1400 07/29/19 1400 07/29/19 1644 07/30/19 0025 07/30/19 0546 07/30/19 1046 07/31/19 0610 08/01/19 0604 08/02/19 0335  WBC 11.7*   < >  --   --  9.4 14.6* 15.0* 15.2* 16.0*  CREATININE 1.21  --   --   --   --  1.03 1.32* 1.33* 1.47*  LATICACIDVEN  --   --  4.1* 1.3  --   --   --   --   --    < > = values in this interval not displayed.    Estimated Creatinine Clearance: 29.1 mL/min (A) (by C-G formula based on SCr of 1.47 mg/dL (H)).    No Known Allergies  Antimicrobials this admission:  Unasyn 3/16>>  Remdesivir 3/16 >> (3/20)  Dose adjustments this admission:  n/a  Microbiology results:  3/16 COVID: positive  2/16 urine: multiple species, final  Thank you for allowing pharmacy to be a part of this patient's care.  Brain Hilts, [place] Phone: [phone] 08/02/2019 7:14 AM

## 2019-08-02 NOTE — Progress Notes (Signed)
Initial Nutrition Assessment  DOCUMENTATION CODES:   Not applicable  INTERVENTION:  Magic cup TID with meals, each supplement provides 290 kcal and 9 grams of protein  Monitor for goals of care   NUTRITION DIAGNOSIS:   Inadequate oral intake related to lethargy/confusion, acute illness(severe dementia; pneumonia secondary to COVID-19 virus) as evidenced by meal completion < 50%.    GOAL:   Patient will meet greater than or equal to 90% of their needs   MONITOR:   PO intake, Weight trends, Supplement acceptance, I & O's, Labs, Other (Comment)(GOC)  REASON FOR ASSESSMENT:   Malnutrition Screening Tool    ASSESSMENT:  RD working remotely.  85 year old male with past medical history significant of HTN, seizures, severe dementia, presented from memory care unit with unresponsiveness and noted to be less responsive for the previous 24-48 hrs prior to hospital presentation. In ED, pt had a visualized tonic-colonic seizure and found to have COVID-19 pneumonia with suspected aspiration pneumonia.  Per chart, patient with ongoing confusion and states that he is not hungry and does not want to eat. Patient is on a dysphagia 1 with honey thickened liquids. Per flowsheets, he consumed 0% of all meals on 3/19. He ate 100% of breakfast this morning, 0% of lunch, and 10% of dinner.  Per notes: -poor baseline quality of life, extremely sick -PCT for GOC pending -continue antiepileptic therapy  -comfort feeds per SLP note  I/Os: 3753 ml since admit   -166 ml x 24 hrs UOP: 925 ml x 24 hrs  Current wt 118.8 lbs Limited wt history for review, on 2/23 pt wt 118.8 lbs, on 11/11/18 pt wt 126.5 lbs. This indicates ~8 lb wt loss over the past 10 months which is insignificant. Given patient history of severe dementia, poor po intake, as well as current pneumonia secondary to Covid-19 infection, he is at increased risk for malnutrition.  Medications reviewed and include: Depakote IVF:  NaCl IVPB: Unasyn, Keppra  Labs: CBGs 83,71,91, BUN 34 (H), Cr 1.47 (H), BNP 237, WBC 16 (H)  NUTRITION - FOCUSED PHYSICAL EXAM: Unable to complete at this time, RD working remotely.  Diet Order:   Diet Order            DIET - DYS 1 Room service appropriate? No; Fluid consistency: Honey Thick  Diet effective now              EDUCATION NEEDS:   No education needs have been identified at this time  Skin:  Skin Assessment: Reviewed RN Assessment  Last BM:  PTA  Height:   Ht Readings from Last 1 Encounters:  07/29/19 5\' 4"  (1.626 m)    Weight:   Wt Readings from Last 1 Encounters:  07/29/19 54 kg    Ideal Body Weight:     BMI:  Body mass index is 20.43 kg/m.  Estimated Nutritional Needs:   Kcal:  1500-1700  Protein:  75-85  Fluid:  >/= 1.5 L/day   Lajuan Lines, RD, LDN Clinical Nutrition After Hours/Weekend Pager # in [phone]

## 2019-08-02 NOTE — Plan of Care (Signed)
  Problem: Clinical Measurements: Goal: Ability to maintain clinical measurements within normal limits will improve Outcome: Not Progressing   Problem: Activity: Goal: Risk for activity intolerance will decrease Outcome: Not Progressing   Problem: Nutrition: Goal: Adequate nutrition will be maintained Outcome: Not Progressing   

## 2019-08-03 LAB — COMPREHENSIVE METABOLIC PANEL
ALT: 20 U/L (ref 0–44)
AST: 26 U/L (ref 15–41)
Albumin: 2 g/dL — ABNORMAL LOW (ref 3.5–5.0)
Alkaline Phosphatase: 51 U/L (ref 38–126)
Anion gap: 13 (ref 5–15)
BUN: 27 mg/dL — ABNORMAL HIGH (ref 8–23)
CO2: 25 mmol/L (ref 22–32)
Calcium: 8.2 mg/dL — ABNORMAL LOW (ref 8.9–10.3)
Chloride: 107 mmol/L (ref 98–111)
Creatinine, Ser: 1.23 mg/dL (ref 0.61–1.24)
GFR calc Af Amer: >60 mL/min (ref >60)
GFR calc non Af Amer: 54 mL/min — ABNORMAL LOW (ref >60)
Glucose, Bld: 74 mg/dL (ref 70–99)
Potassium: 3.9 mmol/L (ref 3.5–5.1)
Sodium: 145 mmol/L (ref 135–145)
Total Bilirubin: 1.4 mg/dL — ABNORMAL HIGH (ref 0.3–1.2)
Total Protein: 5.4 g/dL — ABNORMAL LOW (ref 6.5–8.1)

## 2019-08-03 LAB — CBC WITH DIFFERENTIAL/PLATELET
Abs Immature Granulocytes: 0.17 10*3/uL — ABNORMAL HIGH (ref 0.00–0.07)
Basophils Absolute: 0.1 10*3/uL (ref 0.0–0.1)
Basophils Relative: 0 %
Eosinophils Absolute: 0.1 10*3/uL (ref 0.0–0.5)
Eosinophils Relative: 1 %
HCT: 37.5 % — ABNORMAL LOW (ref 39.0–52.0)
Hemoglobin: 11.8 g/dL — ABNORMAL LOW (ref 13.0–17.0)
Immature Granulocytes: 2 %
Lymphocytes Relative: 21 %
Lymphs Abs: 2.4 10*3/uL (ref 0.7–4.0)
MCH: 29.8 pg (ref 26.0–34.0)
MCHC: 31.5 g/dL (ref 30.0–36.0)
MCV: 94.7 fL (ref 80.0–100.0)
Monocytes Absolute: 1.1 10*3/uL — ABNORMAL HIGH (ref 0.1–1.0)
Monocytes Relative: 10 %
Neutro Abs: 7.9 10*3/uL — ABNORMAL HIGH (ref 1.7–7.7)
Neutrophils Relative %: 66 %
Platelets: 193 10*3/uL (ref 150–400)
RBC: 3.96 MIL/uL — ABNORMAL LOW (ref 4.22–5.81)
RDW: 12.5 % (ref 11.5–15.5)
WBC: 11.7 10*3/uL — ABNORMAL HIGH (ref 4.0–10.5)
nRBC: 0.5 % — ABNORMAL HIGH (ref 0.0–0.2)

## 2019-08-03 LAB — D-DIMER, QUANTITATIVE: D-Dimer, Quant: 1.31 ug/mL-FEU — ABNORMAL HIGH (ref 0.00–0.50)

## 2019-08-03 LAB — C-REACTIVE PROTEIN: CRP: 7 mg/dL — ABNORMAL HIGH (ref <1.0)

## 2019-08-03 LAB — BRAIN NATRIURETIC PEPTIDE: B Natriuretic Peptide: 200.3 pg/mL — ABNORMAL HIGH (ref 0.0–100.0)

## 2019-08-03 LAB — GLUCOSE, CAPILLARY
Glucose-Capillary: 71 mg/dL (ref 70–99)
Glucose-Capillary: 81 mg/dL (ref 70–99)
Glucose-Capillary: 93 mg/dL (ref 70–99)

## 2019-08-03 LAB — MAGNESIUM: Magnesium: 1.9 mg/dL (ref 1.7–2.4)

## 2019-08-03 MED ORDER — SODIUM CHLORIDE 0.9 % IV SOLN
3.0000 g | Freq: Three times a day (TID) | INTRAVENOUS | Status: DC
  Administered 2019-08-03 – 2019-08-04 (×4): 3 g via INTRAVENOUS
  Filled 2019-08-03: qty 3
  Filled 2019-08-03: qty 8
  Filled 2019-08-03: qty 3
  Filled 2019-08-03 (×2): qty 8

## 2019-08-03 MED ORDER — IPRATROPIUM BROMIDE HFA 17 MCG/ACT IN AERS
2.0000 | INHALATION_SPRAY | Freq: Three times a day (TID) | RESPIRATORY_TRACT | Status: DC
  Administered 2019-08-04 – 2019-08-05 (×4): 2 via RESPIRATORY_TRACT
  Filled 2019-08-03: qty 12.9

## 2019-08-03 NOTE — Plan of Care (Signed)
  Problem: Clinical Measurements: Goal: Will remain free from infection Outcome: Progressing   Problem: Clinical Measurements: Goal: Diagnostic test results will improve Outcome: Not Progressing

## 2019-08-03 NOTE — Progress Notes (Signed)
PROGRESS NOTE                                                                                                                                                                                                             Patient Demographics:    [name], is a 85 y.o. male, DOB - [birth year], QIW:[id number]  Outpatient Primary MD for the patient is Sande Brothers, MD    LOS - 5  Admit date - 07/29/2019    Chief Complaint  Patient presents with   Altered Mental Status       Brief Narrative   [name] is a 85 y.o. male with medical history significant of remote history of alcohol dependence, hypertension, seizure, dementia, presented with unresponsiveness, he lives at a memory care unit and was noted to be less responsive for 24 to 48 hours prior to hospital visit.  In the ER he had a visualized tonic-clonic seizure, he was also found to have COVID-19 pneumonia along with a suspicion of aspiration pneumonia.  He was seen by neurology and admitted for further care.   Subjective:   Patient in bed is awake, overall still confused in line with his severe underlying dementia, denies any headache or chest pain.  States he is not hungry and does not want to eat.   Assessment  & Plan :     1. Acute Hypoxic Resp. Failure due to Acute Covid 19 Viral Pneumonitis during the ongoing 2020 Covid 19 Pandemic - he has mild to moderate disease at best, has been started on steroids and remdesivir.  Will be monitored.  Not a candidate for Actemra use.  Once more alert he will be encouraged to sit up in chair in the daytime use I-S and flutter valve for pulmonary toiletry and then prone in bed when at night.   Recent Labs  Lab 07/29/19 1630 07/30/19 1046 07/31/19 0610 08/01/19 0604 08/02/19 0335 08/03/19 0336  CRP  --  7.1* 5.4* 5.2* 6.8* 7.0*  DDIMER  --  >20.00* 1.43* 1.63* 1.43* 1.31*  BNP  --  173.3* 39.8 3,862.1* 237.7* 200.3*    PROCALCITON  --  0.21  --   --   --   --   SARSCOV2NAA POSITIVE*  --   --   --   --   --  Hepatic Function Latest Ref Rng & Units 08/03/2019 08/02/2019 08/01/2019  Total Protein 6.5 - 8.1 g/dL 5.4(L) 5.8(L) 5.2(L)  Albumin 3.5 - 5.0 g/dL 2.0(L) 2.2(L) 2.0(L)  AST 15 - 41 U/L 26 36 36  ALT 0 - 44 U/L '20 26 23  ' Alk Phosphatase 38 - 126 U/L 51 61 48  Total Bilirubin 0.3 - 1.2 mg/dL 1.4(H) 1.5(H) 1.2  Bilirubin, Direct 0.0 - 0.3 mg/dL - - -      2.  Breakthrough seizures.  Question if due to his decreased mental status he was unable to take his oral antiseizure medication at home, CTA & MRI nonacute, neuro on board, as needed IV Ativan and  Keppra for now.  Monitor closely.  3.  Possible aspiration pneumonia.  Unasyn.  Speech to evaluate once mentation improves.  Currently n.p.o.  4.  Hypertension.  Due to n.p.o. status placed on as needed hydralazine.    Patient has very poor baseline quality of life, extremely sick, discussed with daughter, plan is to continue present line of care for a short while, at any stage if it looks like he is suffering we will transition to full comfort measures.  No heroics to be done. Will involve palliative care as well, discussed with daughter who agrees.  He will finally be transition to either SNF with palliative care or residential hospice depending on his clinical course.    Condition - Extremely Guarded  Family Communication  :  Daughter [name] 07/30/19, 3/18 - [phone], 08/01/19, 08/03/19  Code Status :  DNR  Diet :   Diet Order            DIET - DYS 1 Room service appropriate? No; Fluid consistency: Honey Thick  Diet effective now               Disposition Plan  : Stay in the hospital finished treatment for COVID-19 pneumonia, also being treated for breakthrough seizures.  Thereafter likely residential hospice versus SNF with palliative care/hospice on 08/04/2019.  Consults  :  Neuro  Procedures  :    EEG - nonspecific  encephalopathy no ongoing seizures.  MRI Brain and CTA head and Neck - no acute changes  PUD Prophylaxis :    DVT Prophylaxis  :   Heparin   Lab Results  Component Value Date   PLT 193 08/03/2019    Inpatient Medications  Scheduled Meds:  divalproex  500 mg Oral Q12H   heparin  5,000 Units Subcutaneous Q8H   ipratropium  2 puff Inhalation Q6H   mouth rinse  15 mL Mouth Rinse BID   Continuous Infusions:  sodium chloride 10 mL/hr at 08/02/19 0700   ampicillin-sulbactam (UNASYN) IV 3 g (08/03/19 0852)   levETIRAcetam 500 mg (08/03/19 0537)   PRN Meds:.sodium chloride, acetaminophen **OR** acetaminophen, guaiFENesin, guaiFENesin-dextromethorphan, hydrALAZINE, latanoprost, LORazepam, [DISCONTINUED] ondansetron **OR** ondansetron (ZOFRAN) IV, Resource ThickenUp Clear  Antibiotics  :    Anti-infectives (From admission, onward)   Start     Dose/Rate Route Frequency Ordered Stop   08/03/19 0900  Ampicillin-Sulbactam (UNASYN) 3 g in sodium chloride 0.9 % 100 mL IVPB     3 g 200 mL/hr over 30 Minutes Intravenous Every 8 hours 08/03/19 0727     08/02/19 2248  Ampicillin-Sulbactam (UNASYN) 3 g in sodium chloride 0.9 % 100 mL IVPB  Status:  Discontinued     3 g 200 mL/hr over 30 Minutes Intravenous Every 12 hours 08/02/19 1522 08/03/19 0727   07/30/19 1000  remdesivir 100 mg in sodium chloride 0.9 % 100 mL IVPB  Status:  Discontinued     100 mg 200 mL/hr over 30 Minutes Intravenous Daily 07/29/19 1847 07/29/19 1849   07/30/19 1000  remdesivir 100 mg in sodium chloride 0.9 % 100 mL IVPB     100 mg 200 mL/hr over 30 Minutes Intravenous Daily 07/29/19 1851 08/02/19 1900   07/30/19 0200  Ampicillin-Sulbactam (UNASYN) 3 g in sodium chloride 0.9 % 100 mL IVPB  Status:  Discontinued     3 g 200 mL/hr over 30 Minutes Intravenous Every 8 hours 07/29/19 1710 08/02/19 1522   07/29/19 1900  remdesivir 200 mg in sodium chloride 0.9% 250 mL IVPB  Status:  Discontinued     200 mg 580 mL/hr  over 30 Minutes Intravenous Once 07/29/19 1847 07/29/19 1849   07/29/19 1900  remdesivir 100 mg in sodium chloride 0.9 % 100 mL IVPB     100 mg 200 mL/hr over 30 Minutes Intravenous Every 30 min 07/29/19 1851 07/29/19 2218   07/29/19 1615  Ampicillin-Sulbactam (UNASYN) 3 g in sodium chloride 0.9 % 100 mL IVPB     3 g 200 mL/hr over 30 Minutes Intravenous  Once 07/29/19 1611 07/29/19 1928       Time Spent in minutes  30   Lala Lund M.D on 08/03/2019 at 11:21 AM  To page go to [email or web address] - password [facility]  [facility] -  Office  [phone]   See all Orders from today for further details    Objective:   Vitals:   08/03/19 0000 08/03/19 0337 08/03/19 0400 08/03/19 0611  BP: (!) 143/74  (!) 144/78   Pulse: (!) 55 (!) 56 (!) 49 61  Resp: 16     Temp: 97.9 F (36.6 C)     TempSrc: Oral     SpO2: 100% 100% 100% 100%  Weight:      Height:        Wt Readings from Last 3 Encounters:  07/29/19 54 kg  07/08/19 54 kg  11/11/18 57.5 kg     Intake/Output Summary (Last 24 hours) at 08/03/2019 1121 Last data filed at 08/03/2019 0537 Gross per 24 hour  Intake 379.1 ml  Output 1000 ml  Net -620.9 ml     Physical Exam  Extremely weak and frail elderly African-American male in hospital bed in no distress, he is awake more alert today, answers a few questions, denies any headache, says he is not hungry, moves all 4 extremities by himself [place].AT,PERRAL Supple Neck,No JVD, No cervical lymphadenopathy appriciated.  Symmetrical Chest wall movement, Good air movement bilaterally, CTAB RRR,No Gallops, Rubs or new Murmurs, No Parasternal Heave +ve B.Sounds, Abd Soft, No tenderness, No organomegaly appriciated, No rebound - guarding or rigidity. No Cyanosis, Clubbing or edema, No new Rash or bruise     Data Review:    CBC Recent Labs  Lab 07/30/19 0546 07/30/19 0546 07/30/19 1046 07/31/19 0610 08/01/19 0604 08/02/19 0335 08/03/19 0336  WBC 9.4   < > 14.6*  15.0* 15.2* 16.0* 11.7*  HGB 13.1   < > 13.8 11.5* 11.7* 12.9* 11.8*  HCT 41.6   < > 44.3 36.2* 36.1* 40.8 37.5*  PLT 340   < > 238 220 203 242 193  MCV 97.2   < > 97.1 95.5 95.5 95.6 94.7  MCH 30.6   < > 30.3 30.3 31.0 30.2 29.8  MCHC 31.5   < > 31.2 31.8 32.4 31.6 31.5  RDW  12.9   < > 12.1 12.3 12.5 12.6 12.5  LYMPHSABS 0.8  --   --  0.9 1.0 2.1 2.4  MONOABS 0.2  --   --  0.8 1.2* 1.4* 1.1*  EOSABS 0.0  --   --  0.0 0.0 0.0 0.1  BASOSABS 0.0  --   --  0.0 0.0 0.0 0.1   < > = values in this interval not displayed.    Chemistries  Recent Labs  Lab 07/29/19 1400 07/29/19 1400 07/29/19 1415 07/29/19 1828 07/30/19 1046 07/31/19 0610 08/01/19 0604 08/02/19 0335 08/03/19 0336  NA 139   < > 138   < > 144 144 144 143 145  K 4.3   < > 5.1   < > 4.2 3.9 4.5 4.2 3.9  CL 99   < >  --   --  101 106 110 108 107  CO2 19*   < >  --   --  '25 26 23 ' 21* 25  GLUCOSE 129*   < >  --   --  104* 142* 133* 88 74  BUN 19   < >  --   --  19 31* 33* 34* 27*  CREATININE 1.21   < >  --   --  1.03 1.32* 1.33* 1.47* 1.23  CALCIUM 9.0   < >  --   --  9.0 8.2* 7.9* 8.2* 8.2*  AST 44*  --   --   --   --  39 36 36 26  ALT 26  --   --   --   --  '24 23 26 20  ' ALKPHOS 52  --   --   --   --  38 48 61 51  BILITOT 1.8*  --   --   --   --  1.1 1.2 1.5* 1.4*  MG  --   --   --   --  1.7 1.5* 2.2 2.1 1.9  AMMONIA  --   --  29  --   --   --   --   --   --    < > = values in this interval not displayed.     ------------------------------------------------------------------------------------------------------------------ No results for input(s): CHOL, HDL, LDLCALC, TRIG, CHOLHDL, LDLDIRECT in the last 72 hours.  Lab Results  Component Value Date   HGBA1C 5.1 12/14/2014   ------------------------------------------------------------------------------------------------------------------ No results for input(s): TSH, T4TOTAL, T3FREE, THYROIDAB in the last 72 hours.  Invalid input(s): FREET3  Cardiac Enzymes No  results for input(s): CKMB, TROPONINI, MYOGLOBIN in the last 168 hours.  Invalid input(s): CK ------------------------------------------------------------------------------------------------------------------    Component Value Date/Time   BNP 200.3 (H) 08/03/2019 1610    Micro Results Recent Results (from the past 240 hour(s))  Urine culture     Status: Abnormal   Collection Time: 07/29/19  6:09 AM   Specimen: Urine, Random  Result Value Ref Range Status   Specimen Description URINE, RANDOM  Final   Special Requests   Final    NONE Performed at [facility], [address], [place], [place] [address]    Culture MULTIPLE SPECIES PRESENT, SUGGEST RECOLLECTION (A)  Final   Report Status 07/30/2019 FINAL  Final  Respiratory Panel by RT PCR (Flu A&B, Covid) - Nasopharyngeal Swab     Status: Abnormal   Collection Time: 07/29/19  4:30 PM   Specimen: Nasopharyngeal Swab  Result Value Ref Range Status   SARS Coronavirus 2 by RT PCR POSITIVE (A)  NEGATIVE Final    Comment: RESULT CALLED TO, READ BACK BY AND VERIFIED WITHRhona Leavens RN [id number] 07/29/19 A BROWNING (NOTE) SARS-CoV-2 target nucleic acids are DETECTED. SARS-CoV-2 RNA is generally detectable in upper respiratory specimens  during the acute phase of infection. Positive results are indicative of the presence of the identified virus, but do not rule out bacterial infection or co-infection with other pathogens not detected by the test. Clinical correlation with patient history and other diagnostic information is necessary to determine patient infection status. The expected result is Negative. Fact Sheet for Patients:  PinkCheek.be Fact Sheet for Healthcare Providers: GravelBags.it This test is not yet approved or cleared by the [place] FDA and  has been authorized for detection and/or diagnosis of SARS-CoV-2 by FDA under an Emergency Use Authorization (EUA).  This  EUA will remain in effect (meaning this test can be used) for  the duration of  the COVID-19 declaration under Section 564(b)(1) of the Act, 21 U.S.C. section 360bbb-3(b)(1), unless the authorization is terminated or revoked sooner.    Influenza A by PCR NEGATIVE NEGATIVE Final   Influenza B by PCR NEGATIVE NEGATIVE Final    Comment: (NOTE) The Xpert Xpress SARS-CoV-2/FLU/RSV assay is intended as an aid in  the diagnosis of influenza from Nasopharyngeal swab specimens and  should not be used as a sole basis for treatment. Nasal washings and  aspirates are unacceptable for Xpert Xpress SARS-CoV-2/FLU/RSV  testing. Fact Sheet for Patients: PinkCheek.be Fact Sheet for Healthcare Providers: GravelBags.it This test is not yet approved or cleared by the [place] FDA and  has been authorized for detection and/or diagnosis of SARS-CoV-2 by  FDA under an Emergency Use Authorization (EUA). This EUA will remain  in effect (meaning this test can be used) for the duration of the  Covid-19 declaration under Section 564(b)(1) of the Act, 21  U.S.C. section 360bbb-3(b)(1), unless the authorization is  terminated or revoked. Performed at [facility], [place] [address], [place], [place] [address]   MRSA PCR Screening     Status: None   Collection Time: 08/02/19  5:39 AM   Specimen: Nasal Mucosa; Nasopharyngeal  Result Value Ref Range Status   MRSA by PCR NEGATIVE NEGATIVE Final    Comment:        The GeneXpert MRSA Assay (FDA approved for NASAL specimens only), is one component of a comprehensive MRSA colonization surveillance program. It is not intended to diagnose MRSA infection nor to guide or monitor treatment for MRSA infections. Performed at [facility], [place] [address] [place], [place] [address]     Radiology Reports EEG  Result Date: 07/29/2019 Lora Havens, MD     07/29/2019  4:15 PM Patient Name:  Tyreese Thain MRN: [id number] Epilepsy Attending: Lora Havens Referring Physician/Provider: Etta Quill, PA Date: 07/29/2019 Duration: 26.07 minutes Patient history: 85 year old male with history of alcohol withdrawal seizures, dementia who presented after seizure-like episode.  EEG to evaluate for seizures. Level of alertness: Lethargic AEDs during EEG study: Ativan, Depakote Technical aspects: This EEG study was done with scalp electrodes positioned according to the 10-20 International system of electrode placement. Electrical activity was acquired at a sampling rate of '500Hz'  and reviewed with a high frequency filter of '70Hz'  and a low frequency filter of '1Hz' . EEG data were recorded continuously and digitally stored. Description: No clear posterior dominant was seen.  EEG showed continuous generalized low amplitude 2 to 3 Hz delta slowing.  Hyperventilation and photic  stimulation were not performed.  Of note, EEG was technically difficult secondary to significant myogenic artifact. Abnormality - Continuous slow, generalized IMPRESSION: This technically difficult study is suggestive of moderate to severe diffuse encephalopathy, nonspecific to etiology. No seizures or epileptiform discharges were seen throughout the recording. Lora Havens   CT Angio Head W or Wo Contrast  Result Date: 07/29/2019 CLINICAL DATA:  Encephalopathy. Additional history provided: Unresponsive episode, at renal respirations, CPR, seizure EN route EXAM: CT ANGIOGRAPHY HEAD AND NECK TECHNIQUE: Multidetector CT imaging of the head and neck was performed using the standard protocol during bolus administration of intravenous contrast. Multiplanar CT image reconstructions and MIPs were obtained to evaluate the vascular anatomy. Carotid stenosis measurements (when applicable) are obtained utilizing NASCET criteria, using the distal internal carotid diameter as the denominator. CONTRAST:  66m OMNIPAQUE IOHEXOL 350 MG/ML SOLN COMPARISON:   Noncontrast head CT 07/29/2019 FINDINGS: CTA NECK FINDINGS The examination is mildly motion degraded. Aortic arch: Common origin of the innominate and left common carotid arteries. The visualized aortic arch is unremarkable. No innominate or proximal subclavian artery stenosis. Right carotid system: CCA and ICA patent within the neck without stenosis. Left carotid system: CCA and ICA patent within the neck without stenosis. Vertebral arteries: The right vertebral artery is dominant. The vertebral arteries are patent within the neck bilaterally without stenosis. Skeleton: No acute bony abnormality or aggressive osseous lesion identified. Cervical spondylosis without high-grade bony spinal canal narrowing. Other neck: No neck mass or cervical lymphadenopathy. Upper chest: No consolidation within the imaged lung apices. Review of the MIP images confirms the above findings CTA HEAD FINDINGS Anterior circulation: The intracranial internal carotid arteries are patent without stenosis. The M1 middle cerebral arteries are patent without significant stenosis. No M2 proximal branch occlusion or high-grade proximal stenosis is identified. The anterior cerebral arteries are patent without high-grade proximal stenosis. No intracranial aneurysm is identified. Posterior circulation: The dominant intracranial right vertebral artery is patent without stenosis and supplies the basilar artery. The non dominant intracranial left vertebral artery is markedly diminutive beyond the origin of the left PICA, although faintly patent. The basilar artery is patent without significant stenosis. Predominantly fetal origin of the right posterior cerebral artery which is patent without significant proximal stenosis. The left posterior cerebral artery is patent without significant proximal stenosis. A small left posterior communicating artery is present. Venous sinuses: Within limitations of contrast timing, no convincing thrombus. Anatomic  variants: As described Review of the MIP images confirms the above findings IMPRESSION: CTA neck: The bilateral common carotid, internal carotid and vertebral arteries are patent within the neck without stenosis. The right vertebral artery is dominant. CTA head: 1. No intracranial large vessel occlusion. 2. The intracranial left vertebral artery is markedly diminutive beyond the origin of the left PICA. This is suspected largely on a developmental basis given significant non dominance. A significant V4 left vertebral artery stenosis cannot be definitively excluded. The dominant intracranial right vertebral artery is widely patent and supplies the basilar artery. 3. Otherwise, no evidence of proximal high-grade arterial stenosis. Electronically Signed   By: KKellie SimmeringDO   On: 07/29/2019 15:23   CT HEAD WO CONTRAST  Result Date: 07/29/2019 CLINICAL DATA:  Encephalopathy. EXAM: CT HEAD WITHOUT CONTRAST TECHNIQUE: Contiguous axial images were obtained from the base of the skull through the vertex without intravenous contrast. COMPARISON:  Head CT 03/07/2015 FINDINGS: Brain: There is no evidence of acute intracranial hemorrhage, intracranial mass, midline shift or extra-axial fluid collection.No demarcated cortical  infarction. Moderate ill-defined hypoattenuation within the cerebral white matter is similar to prior examination 03/07/2015 and nonspecific, but consistent with chronic small vessel ischemic disease. Moderate generalized parenchymal atrophy has progressed. Vascular: No hyperdense vessel.  Atherosclerotic calcifications. Skull: Normal. Negative for fracture or focal lesion. Sinuses/Orbits: Visualized orbits demonstrate no acute abnormality. No significant paranasal sinus disease or mastoid effusion at the imaged levels. Other: Partially visualized support tubes. IMPRESSION: No evidence of acute intracranial abnormality. Moderate chronic small vessel ischemic disease, stable as compared to head CT  03/07/2015. Moderate generalized parenchymal atrophy has progressed. Electronically Signed   By: Kellie Simmering DO   On: 07/29/2019 14:16   CT Angio Neck W and/or Wo Contrast  Result Date: 07/29/2019 CLINICAL DATA:  Encephalopathy. Additional history provided: Unresponsive episode, at renal respirations, CPR, seizure EN route EXAM: CT ANGIOGRAPHY HEAD AND NECK TECHNIQUE: Multidetector CT imaging of the head and neck was performed using the standard protocol during bolus administration of intravenous contrast. Multiplanar CT image reconstructions and MIPs were obtained to evaluate the vascular anatomy. Carotid stenosis measurements (when applicable) are obtained utilizing NASCET criteria, using the distal internal carotid diameter as the denominator. CONTRAST:  38m OMNIPAQUE IOHEXOL 350 MG/ML SOLN COMPARISON:  Noncontrast head CT 07/29/2019 FINDINGS: CTA NECK FINDINGS The examination is mildly motion degraded. Aortic arch: Common origin of the innominate and left common carotid arteries. The visualized aortic arch is unremarkable. No innominate or proximal subclavian artery stenosis. Right carotid system: CCA and ICA patent within the neck without stenosis. Left carotid system: CCA and ICA patent within the neck without stenosis. Vertebral arteries: The right vertebral artery is dominant. The vertebral arteries are patent within the neck bilaterally without stenosis. Skeleton: No acute bony abnormality or aggressive osseous lesion identified. Cervical spondylosis without high-grade bony spinal canal narrowing. Other neck: No neck mass or cervical lymphadenopathy. Upper chest: No consolidation within the imaged lung apices. Review of the MIP images confirms the above findings CTA HEAD FINDINGS Anterior circulation: The intracranial internal carotid arteries are patent without stenosis. The M1 middle cerebral arteries are patent without significant stenosis. No M2 proximal branch occlusion or high-grade proximal  stenosis is identified. The anterior cerebral arteries are patent without high-grade proximal stenosis. No intracranial aneurysm is identified. Posterior circulation: The dominant intracranial right vertebral artery is patent without stenosis and supplies the basilar artery. The non dominant intracranial left vertebral artery is markedly diminutive beyond the origin of the left PICA, although faintly patent. The basilar artery is patent without significant stenosis. Predominantly fetal origin of the right posterior cerebral artery which is patent without significant proximal stenosis. The left posterior cerebral artery is patent without significant proximal stenosis. A small left posterior communicating artery is present. Venous sinuses: Within limitations of contrast timing, no convincing thrombus. Anatomic variants: As described Review of the MIP images confirms the above findings IMPRESSION: CTA neck: The bilateral common carotid, internal carotid and vertebral arteries are patent within the neck without stenosis. The right vertebral artery is dominant. CTA head: 1. No intracranial large vessel occlusion. 2. The intracranial left vertebral artery is markedly diminutive beyond the origin of the left PICA. This is suspected largely on a developmental basis given significant non dominance. A significant V4 left vertebral artery stenosis cannot be definitively excluded. The dominant intracranial right vertebral artery is widely patent and supplies the basilar artery. 3. Otherwise, no evidence of proximal high-grade arterial stenosis. Electronically Signed   By: KKellie SimmeringDO   On: 07/29/2019 15:23  CT Chest Wo Contrast  Result Date: 07/15/2019 CLINICAL DATA:  Chronic dyspnea. EXAM: CT CHEST WITHOUT CONTRAST TECHNIQUE: Multidetector CT imaging of the chest was performed following the standard protocol without IV contrast. COMPARISON:  September 09, 2014. FINDINGS: Cardiovascular: No significant vascular findings.  Normal heart size. No pericardial effusion. Mediastinum/Nodes: No enlarged mediastinal or axillary lymph nodes. Thyroid gland, trachea, and esophagus demonstrate no significant findings. Lungs/Pleura: No pneumothorax or pleural effusion is noted. Left lung is clear. Mild right posterior basilar subsegmental atelectasis is noted. Upper Abdomen: Cholelithiasis is noted. Musculoskeletal: No chest wall mass or suspicious bone lesions identified. IMPRESSION: 1. Mild right posterior basilar subsegmental atelectasis. 2. Cholelithiasis. Electronically Signed   By: Marijo Conception M.D.   On: 07/15/2019 13:38   MR BRAIN WO CONTRAST  Result Date: 07/29/2019 CLINICAL DATA:  Seizure with subsequent mental status changes. EXAM: MRI HEAD WITHOUT CONTRAST TECHNIQUE: Multiplanar, multiecho pulse sequences of the brain and surrounding structures were obtained without intravenous contrast. COMPARISON:  CT studies earlier same day. FINDINGS: Brain: Diffusion imaging does not show any acute or subacute infarction. There is generalized brain atrophy. No focal abnormality affects the brainstem or cerebellum. The cerebral hemispheres show chronic small-vessel ischemic changes of the white matter. No large vessel territory infarction. No mass lesion, hemorrhage, hydrocephalus or extra-axial collection. No focal mesial temporal lesion. There is considerable temporal lobe atrophy. Vascular: Major vessels at the base of the brain show flow. Skull and upper cervical spine: Negative Sinuses/Orbits: Clear/normal Other: None IMPRESSION: No acute or reversible finding. Generalized brain atrophy, with some temporal lobe predominance. Chronic small-vessel ischemic changes of the cerebral hemispheric white matter. Electronically Signed   By: Nelson Chimes M.D.   On: 07/29/2019 18:13   DG Chest Port 1 View  Result Date: 08/01/2019 CLINICAL DATA:  Shortness of breath EXAM: PORTABLE CHEST 1 VIEW COMPARISON:  July 30, 2019 FINDINGS: A portion of  the right apex is obscured by the patient's mandible. There is ill-defined opacity in each lung base, a new finding. Heart size and pulmonary vascular normal. No adenopathy. Old healed rib fractures on the left again noted. Underlying osteoporosis noted. IMPRESSION: Ill-defined opacity in the lung bases, concerning for early pneumonia in these regions. Aspiration could present similarly. Lungs elsewhere clear. Heart size normal. Bones osteoporotic. Electronically Signed   By: Lowella Grip III M.D.   On: 08/01/2019 08:40   DG Chest Port 1 View  Result Date: 07/30/2019 CLINICAL DATA:  Shortness of breath, COVID symptoms EXAM: PORTABLE CHEST 1 VIEW COMPARISON:  07/29/2019 FINDINGS: The heart size and mediastinal contours are within normal limits. Both lungs are clear. Callused fracture deformities of the left fifth and sixth ribs again noted. Acute fractures are not appreciated. IMPRESSION: No acute abnormality of the lungs in AP portable projection. Electronically Signed   By: Eddie Candle M.D.   On: 07/30/2019 08:39   DG Chest Port 1 View  Result Date: 07/29/2019 CLINICAL DATA:  Altered level of consciousness, CPR EXAM: PORTABLE CHEST 1 VIEW COMPARISON:  07/29/2019 at 0211 hours FINDINGS: The heart size and mediastinal contours are within normal limits. Linear right basilar atelectasis. Lungs otherwise clear. Probable nondisplaced rib fractures involving the lateral aspects of the left third, fourth, and fifth ribs. No pneumothorax. IMPRESSION: Probable nondisplaced rib fractures involving the lateral aspects of the left third, fourth, and fifth ribs. No pneumothorax. Electronically Signed   By: Davina Poke D.O.   On: 07/29/2019 14:03   DG Chest [facility]  Result Date: 07/29/2019 CLINICAL DATA:  85 year old male with shortness of breath. EXAM: PORTABLE CHEST 1 VIEW COMPARISON:  Chest radiograph dated 07/15/2019. FINDINGS: No focal consolidation, pleural effusion or pneumothorax. Right lung  base atelectasis/scarring. The cardiac silhouette is within normal limits. Osteopenia with degenerative changes of the spine. No acute osseous pathology. IMPRESSION: No acute cardiopulmonary process. Electronically Signed   By: Anner Crete M.D.   On: 07/29/2019 02:37   DG Chest Port 1 View  Result Date: 07/15/2019 CLINICAL DATA:  Increased difficulty breathing, cough and congestion for 2 weeks, history dementia, hypertension, colon cancer, former smoker EXAM: PORTABLE CHEST 1 VIEW COMPARISON:  Portable exam 1017 hours compared to 07/08/2019 FINDINGS: Normal heart size, mediastinal contours, and pulmonary vascularity. Subsegmental atelectasis at RIGHT base unchanged. Remaining lungs clear. No pleural effusion or pneumothorax. Bones demineralized. IMPRESSION: Chronic RIGHT basilar atelectasis without acute infiltrate. Electronically Signed   By: Lavonia Dana M.D.   On: 07/15/2019 11:01   DG Chest Port 1 View  Result Date: 07/08/2019 CLINICAL DATA:  Shortness of breath EXAM: PORTABLE CHEST 1 VIEW COMPARISON:  11/07/2018 FINDINGS: The heart size and mediastinal contours are within normal limits. No focal airspace consolidation, pleural effusion, or pneumothorax. The visualized skeletal structures are unremarkable. IMPRESSION: No active disease. Electronically Signed   By: Davina Poke D.O.   On: 07/08/2019 10:39   DG Abd Portable 1V  Result Date: 07/30/2019 CLINICAL DATA:  Altered mental status. Short of breath. Constipation. EXAM: PORTABLE ABDOMEN - 1 VIEW COMPARISON:  CT, 06/08/2018 FINDINGS: Normal bowel gas pattern.  No significant increase colonic stool. No evidence of renal ureteral stones. Soft tissues are unremarkable. Stable right hip hemiarthroplasty.  No acute skeletal abnormality. IMPRESSION: 1. No acute findings.  No evidence of bowel obstruction. 2. No significant increase colonic stool burden. Electronically Signed   By: Lajean Manes M.D.   On: 07/30/2019 09:24   Overnight EEG with  video  Result Date: 07/31/2019 Lora Havens, MD     07/31/2019 12:01 PM Patient Name: Flynt Breeze MRN: [id number] Epilepsy Attending: Lora Havens Referring Physician/Provider: D Duration: 07/30/2019 1531 to 07/31/2019 1027 Duration of study: 19 hours  Patient history: 85 year old male with history of alcohol withdrawal seizures, dementia who presented after seizure-like episode.  EEG to evaluate for seizures.  Level of alertness: Lethargic, asleep  AEDs during EEG study: Keppra  Technical aspects: This EEG study was done with scalp electrodes positioned according to the 10-20 International system of electrode placement. Electrical activity was acquired at a sampling rate of '500Hz'  and reviewed with a high frequency filter of '70Hz'  and a low frequency filter of '1Hz' . EEG data were recorded continuously and digitally stored.  Description: During awake state, no clear posterior dominant was seen.  Sleep was characterized by vertex waves, sleep spindles (12 to 14 Hz), maximal frontocentral. EEG showed continuous generalized 3 to 6 Hz theta-delta slowing.  Hyperventilation and photic stimulation were not performed.   Abnormality - Continuous slow, generalized  IMPRESSION: This study is suggestive of moderate diffuse encephalopathy, nonspecific to etiology. No seizures or epileptiform discharges were seen throughout the recording.  Lora Havens   ECHOCARDIOGRAM LIMITED  Result Date: 07/30/2019    ECHOCARDIOGRAM LIMITED REPORT   Patient Name:   DJIMON LUNDSTROM Date of Exam: 07/30/2019 Medical Rec #:  [id number]  Height:       64.0 in Accession #:    [id number] Weight:       119.0 lb Date of Birth:  [birth year]  BSA:          1.569 m Patient Age:    69 years   BP:           116/74 mmHg Patient Gender: M          HR:           68 bpm. Exam Location:  Inpatient Procedure: Limited Echo Indications:    786.09 dyspnea  History:        Patient has no prior history of Echocardiogram examinations.                 Risk  Factors:Hypertension. Hx of alcohol dependence. Covid +.  Sonographer:    Jannett Celestine RDCS (AE) Referring Phys: [id number] Lequita Halt  Sonographer Comments: No parasternal window. patient in a constricted position (very limited mobility). IMPRESSIONS  1. Technically difficult study with limited views  2. Left ventricular ejection fraction, by estimation, is 65 to 70%. The left ventricle has normal function.  3. Right ventricle is not well visualized  4. The mitral valve is normal in structure. No evidence of mitral valve regurgitation.  5. The aortic valve was not well visualized. Aortic valve regurgitation is not visualized. No aortic stenosis is present.  6. The inferior vena cava is normal in size with greater than 50% respiratory variability, suggesting right atrial pressure of 3 mmHg. FINDINGS  Left Ventricle: Left ventricular ejection fraction, by estimation, is 65 to 70%. The left ventricle has normal function. Right Ventricle: The right ventricular size is not well visualized. Right ventricular systolic function was not well visualized. Pericardium: Trivial pericardial effusion is present. Mitral Valve: The mitral valve is normal in structure. Tricuspid Valve: The tricuspid valve is not well visualized. Aortic Valve: The aortic valve was not well visualized. Aortic valve regurgitation is not visualized. No aortic stenosis is present. Pulmonic Valve: The pulmonic valve was not well visualized. Venous: The inferior vena cava is normal in size with greater than 50% respiratory variability, suggesting right atrial pressure of 3 mmHg. Oswaldo Milian MD Electronically signed by Oswaldo Milian MD Signature Date/Time: 07/30/2019/8:17:32 PM    Final

## 2019-08-03 NOTE — Progress Notes (Signed)
Pharmacy Antibiotic Note  [name] is a 85 y.o. male on day # 5 Unasyn for aspiration pneumonia.    BUN/creatinine trend improved today -SCr down to 1.23, BUN down to 27. CrCl 35 mL/min. Will resume Unasyn at q8h dosing and follow..     Completed 5 days of Remdesivir for COVID+.   Plan:  Reduce Unasyn 3 gm IV q8hrs.  Will follow renal function for any need to adjust regimen.  Follow up clinical progress, length of therapy.  Height: 5\' 4"  (162.6 cm) Weight: 119 lb 0.8 oz (54 kg)(Per recent visit <1 month ago) IBW/kg (Calculated) : 59.2  Temp (24hrs), Avg:98.2 F (36.8 C), Min:97.9 F (36.6 C), Max:98.6 F (37 C)  Recent Labs  Lab  0000 07/29/19 1644 07/30/19 0025 07/30/19 0546 07/30/19 1046 07/31/19 0610 08/01/19 0604 08/02/19 0335 08/03/19 0336  WBC  --   --   --    < > 14.6* 15.0* 15.2* 16.0* 11.7*  CREATININE   < >  --   --   --  1.03 1.32* 1.33* 1.47* 1.23  LATICACIDVEN  --  4.1* 1.3  --   --   --   --   --   --    < > = values in this interval not displayed.    Estimated Creatinine Clearance: 34.8 mL/min (by C-G formula based on SCr of 1.23 mg/dL).    No Known Allergies  Antimicrobials this admission:  Unasyn 3/16>>  Remdesivir 3/16 >> (3/20)  Dose adjustments this admission:  n/a  Microbiology results:  3/16 COVID: positive  2/16 urine: multiple species, final  Thank you for allowing pharmacy to be a part of this patient's care.  Sloan Leiter, PharmD, BCPS, BCCCP Clinical Pharmacist Please refer to [facility] for [place] numbers 08/03/2019 7:23 AM

## 2019-08-03 NOTE — Plan of Care (Signed)
  Problem: Activity: Goal: Risk for activity intolerance will decrease Outcome: Not Progressing   Problem: Nutrition: Goal: Adequate nutrition will be maintained Outcome: Not Progressing   

## 2019-08-04 DIAGNOSIS — R569 Unspecified convulsions: Secondary | ICD-10-CM

## 2019-08-04 DIAGNOSIS — U071 COVID-19: Principal | ICD-10-CM

## 2019-08-04 DIAGNOSIS — Z515 Encounter for palliative care: Secondary | ICD-10-CM

## 2019-08-04 MED ORDER — HALOPERIDOL LACTATE 5 MG/ML IJ SOLN: 0.5000 mg | INTRAMUSCULAR | Status: DC | PRN

## 2019-08-04 MED ORDER — HALOPERIDOL 0.5 MG PO TABS
0.5000 mg | ORAL_TABLET | ORAL | Status: DC | PRN
  Filled 2019-08-04: qty 1

## 2019-08-04 MED ORDER — ONDANSETRON HCL 4 MG/2ML IJ SOLN: 4.0000 mg | Freq: Four times a day (QID) | INTRAMUSCULAR | Status: DC | PRN

## 2019-08-04 MED ORDER — GLYCOPYRROLATE 1 MG PO TABS: 1.0000 mg | ORAL_TABLET | ORAL | Status: DC | PRN

## 2019-08-04 MED ORDER — GLYCOPYRROLATE 0.2 MG/ML IJ SOLN
0.2000 mg | INTRAMUSCULAR | Status: DC | PRN
  Filled 2019-08-04: qty 1

## 2019-08-04 MED ORDER — GLYCOPYRROLATE 0.2 MG/ML IJ SOLN
0.2000 mg | Freq: Once | INTRAMUSCULAR | Status: AC
  Administered 2019-08-04: 0.2 mg via INTRAVENOUS
  Filled 2019-08-04: qty 1

## 2019-08-04 MED ORDER — POLYVINYL ALCOHOL 1.4 % OP SOLN
1.0000 [drp] | Freq: Four times a day (QID) | OPHTHALMIC | Status: DC | PRN
  Filled 2019-08-04: qty 15

## 2019-08-04 MED ORDER — GLYCOPYRROLATE 0.2 MG/ML IJ SOLN: 0.2000 mg | INTRAMUSCULAR | Status: DC | PRN

## 2019-08-04 MED ORDER — HYDROMORPHONE HCL 1 MG/ML IJ SOLN: 0.2500 mg | INTRAMUSCULAR | Status: DC | PRN

## 2019-08-04 MED ORDER — ONDANSETRON 4 MG PO TBDP: 4.0000 mg | ORAL_TABLET | Freq: Four times a day (QID) | ORAL | Status: DC | PRN

## 2019-08-04 MED ORDER — HALOPERIDOL LACTATE 2 MG/ML PO CONC
0.5000 mg | ORAL | Status: DC | PRN
  Filled 2019-08-04: qty 0.3

## 2019-08-04 MED ORDER — BIOTENE DRY MOUTH MT LIQD: 15.0000 mL | OROMUCOSAL | Status: DC | PRN

## 2019-08-04 NOTE — TOC Progression Note (Signed)
Transition of Care [facility]) - Progression Note    Patient Details  Name: Mercer Russaw MRN: [id number] Date of Birth: 03/18/1935  [facility]) CM/SW [place], LCSW Phone Number: 08/04/2019, 11:02 AM  Clinical Narrative:    CSW received consult for residential hospice placement. Per [employer] [facility], they no longer accept COVID patients. CSW made patient's daughter aware. She is in agreement for [facility]. CSW sent referral to [facility] for review.    Expected Discharge Plan: [place] Barriers to Discharge: (Hospice when bed available)  Expected Discharge Plan and Services Expected Discharge Plan: [place] In-house Referral: Clinical Social Work   Post Acute Care Choice: [place] Living arrangements for the past 2 months: [place]                                       Social Determinants of Health (SDOH) Interventions    Readmission Risk Interventions Readmission Risk Prevention Plan 08/04/2019  Transportation Screening Complete  PCP or Specialist Appt within 3-5 Days Complete  HRI or [place] Complete  Social Work Consult for [place] Planning/Counseling Complete  Palliative Care Screening Complete  Medication Review Press photographer) Complete  Some recent data might be hidden

## 2019-08-04 NOTE — Progress Notes (Signed)
PROGRESS NOTE                                                                                                                                                                                                             Patient Demographics:    [name], is a 85 y.o. male, DOB - 07-Mar-1935, VPX:[id number]  Outpatient Primary MD for the patient is Sande Brothers, MD    LOS - 6  Admit date - 07/29/2019    Chief Complaint  Patient presents with  . Altered Mental Status       Brief Narrative   [name] is a 85 y.o. male with medical history significant of remote history of alcohol dependence, hypertension, seizure, dementia, presented with unresponsiveness, he lives at a memory care unit and was noted to be less responsive for 24 to 48 hours prior to hospital visit.  In the ER he had a visualized tonic-clonic seizure, he was also found to have COVID-19 pneumonia along with a suspicion of aspiration pneumonia.  He was seen by neurology and admitted for further care.   Subjective:   Patient in bed appears to be comfortable, baseline demented, will answer only few questions denies any headache or chest pain.   Assessment  & Plan :     1. Acute Hypoxic Resp. Failure due to Acute Covid 19 Viral Pneumonitis during the ongoing 2020 Covid 19 Pandemic - he has mild to moderate disease at best, has been started on steroids and remdesivir.  Will be monitored.  Not a candidate for Actemra use.  Once more alert he will be encouraged to sit up in chair in the daytime use I-S and flutter valve for pulmonary toiletry and then prone in bed when at night.   Recent Labs  Lab 07/29/19 1630 07/30/19 1046 07/31/19 0610 08/01/19 0604 08/02/19 0335 08/03/19 0336  CRP  --  7.1* 5.4* 5.2* 6.8* 7.0*  DDIMER  --  >20.00* 1.43* 1.63* 1.43* 1.31*  BNP  --  173.3* 39.8 3,862.1* 237.7* 200.3*  PROCALCITON  --  0.21  --   --   --   --   SARSCOV2NAA  POSITIVE*  --   --   --   --   --     Hepatic Function Latest Ref Rng & Units 08/03/2019 08/02/2019 08/01/2019  Total Protein 6.5 -  8.1 g/dL 5.4(L) 5.8(L) 5.2(L)  Albumin 3.5 - 5.0 g/dL 2.0(L) 2.2(L) 2.0(L)  AST 15 - 41 U/L 26 36 36  ALT 0 - 44 U/L _0 Alk Phosphatase 38 - 126 U/L 51 61 48  Total Bilirubin 0.3 - 1.2 mg/dL 1.4(H) 1.5(H) 1.2  Bilirubin, Direct 0.0 - 0.3 mg/dL - - -      2.  Breakthrough seizures.  Question if due to his decreased mental status he was unable to take his oral antiseizure medication at home, CTA & MRI nonacute, neuro on board, as needed IV Ativan and  Keppra for now.  Monitor closely.  3.  Possible aspiration pneumonia.  Unasyn.  Speech to evaluate once mentation improves.  Currently n.p.o.  4.  Hypertension.  Due to n.p.o. status placed on as needed hydralazine.    Patient has poor baseline quality of care and quality of life, discussed with daughter multiple times, also seen by palliative care on 08/04/2019, gentle medical treatment to continue likely residential hospice discharge on 08/04/2019.Marland Kitchen    Condition - Extremely Guarded  Family Communication  :  Daughter [name] 07/30/19, 3/18 - [phone], 08/01/19, 08/03/19  Code Status :  DNR  Diet :   Diet Order            DIET - DYS 1 Room service appropriate? No; Fluid consistency: Honey Thick  Diet effective now               Disposition Plan  : Stay in the hospital finished treatment for COVID-19 pneumonia, also being treated for breakthrough seizures.  Thereafter likely residential hospice versus SNF with palliative care/hospice on 08/05/2019.  Consults  :  Neuro  Procedures  :    EEG - nonspecific encephalopathy no ongoing seizures.  MRI Brain and CTA head and Neck - no acute changes  PUD Prophylaxis :    DVT Prophylaxis  :   Heparin   Lab Results  Component Value Date   PLT 193 08/03/2019    Inpatient Medications  Scheduled Meds: . divalproex  500 mg Oral Q12H  .  glycopyrrolate  0.2 mg Intravenous Once  . ipratropium  2 puff Inhalation TID  . mouth rinse  15 mL Mouth Rinse BID   Continuous Infusions: . sodium chloride 10 mL/hr at 08/02/19 0700  . levETIRAcetam 500 mg (08/04/19 0703)   PRN Meds:.sodium chloride, acetaminophen **OR** acetaminophen, antiseptic oral rinse, glycopyrrolate **OR** glycopyrrolate **OR** glycopyrrolate, guaiFENesin, guaiFENesin-dextromethorphan, haloperidol **OR** haloperidol **OR** haloperidol lactate, HYDROmorphone (DILAUDID) injection, latanoprost, LORazepam, [DISCONTINUED] ondansetron **OR** ondansetron (ZOFRAN) IV, ondansetron **OR** ondansetron (ZOFRAN) IV, polyvinyl alcohol, Resource ThickenUp Clear  Antibiotics  :    Anti-infectives (From admission, onward)   Start     Dose/Rate Route Frequency Ordered Stop   08/03/19 0900  Ampicillin-Sulbactam (UNASYN) 3 g in sodium chloride 0.9 % 100 mL IVPB  Status:  Discontinued     3 g 200 mL/hr over 30 Minutes Intravenous Every 8 hours 08/03/19 0727 08/04/19 1019   08/02/19 2248  Ampicillin-Sulbactam (UNASYN) 3 g in sodium chloride 0.9 % 100 mL IVPB  Status:  Discontinued     3 g 200 mL/hr over 30 Minutes Intravenous Every 12 hours 08/02/19 1522 08/03/19 0727   07/30/19 1000  remdesivir 100 mg in sodium chloride 0.9 % 100 mL IVPB  Status:  Discontinued     100 mg 200 mL/hr over 30 Minutes Intravenous Daily 07/29/19 1847 07/29/19 1849   07/30/19 1000  remdesivir 100 mg in sodium  chloride 0.9 % 100 mL IVPB     100 mg 200 mL/hr over 30 Minutes Intravenous Daily 07/29/19 1851 08/02/19 1900   07/30/19 0200  Ampicillin-Sulbactam (UNASYN) 3 g in sodium chloride 0.9 % 100 mL IVPB  Status:  Discontinued     3 g 200 mL/hr over 30 Minutes Intravenous Every 8 hours 07/29/19 1710 08/02/19 1522   07/29/19 1900  remdesivir 200 mg in sodium chloride 0.9% 250 mL IVPB  Status:  Discontinued     200 mg 580 mL/hr over 30 Minutes Intravenous Once 07/29/19 1847 07/29/19 1849   07/29/19 1900   remdesivir 100 mg in sodium chloride 0.9 % 100 mL IVPB     100 mg 200 mL/hr over 30 Minutes Intravenous Every 30 min 07/29/19 1851 07/29/19 2218   07/29/19 1615  Ampicillin-Sulbactam (UNASYN) 3 g in sodium chloride 0.9 % 100 mL IVPB     3 g 200 mL/hr over 30 Minutes Intravenous  Once 07/29/19 1611 07/29/19 1928       Time Spent in minutes  30   Lala Lund M.D on 08/04/2019 at 11:04 AM  To page go to [email or web address] - password [facility]  [facility] -  Office  [phone]   See all Orders from today for further details    Objective:   Vitals:   08/03/19 0611 08/03/19 1600 08/03/19 2025 08/04/19 0614  BP:  (!) 155/65 (!) 145/74 (!) 156/69  Pulse: 61 60 (!) 52 (!) 56  Resp:  _0 Temp:  98.3 F (36.8 C) 97.6 F (36.4 C) 97.9 F (36.6 C)  TempSrc:  Axillary Axillary Oral  SpO2: 100% 93% 98% 97%  Weight:      Height:        Wt Readings from Last 3 Encounters:  07/29/19 54 kg  07/08/19 54 kg  11/11/18 57.5 kg     Intake/Output Summary (Last 24 hours) at 08/04/2019 1104 Last data filed at 08/03/2019 1426 Gross per 24 hour  Intake 220 ml  Output 900 ml  Net -680 ml     Physical Exam  Extremely weak and frail elderly African-American male in hospital bed in no distress, he is awake more alert today, answers a few questions, denies any headache, says he is not hungry, moves all 4 extremities by himself [place].AT,PERRAL Supple Neck,No JVD, No cervical lymphadenopathy appriciated.  Symmetrical Chest wall movement, Good air movement bilaterally, CTAB RRR,No Gallops, Rubs or new Murmurs, No Parasternal Heave +ve B.Sounds, Abd Soft, No tenderness, No organomegaly appriciated, No rebound - guarding or rigidity. No Cyanosis, Clubbing or edema, No new Rash or bruise     Data Review:    CBC Recent Labs  Lab 07/30/19 0546 07/30/19 0546 07/30/19 1046 07/31/19 0610 08/01/19 0604 08/02/19 0335 08/03/19 0336  WBC 9.4   < > 14.6* 15.0* 15.2* 16.0* 11.7*  HGB  13.1   < > 13.8 11.5* 11.7* 12.9* 11.8*  HCT 41.6   < > 44.3 36.2* 36.1* 40.8 37.5*  PLT 340   < > 238 220 203 242 193  MCV 97.2   < > 97.1 95.5 95.5 95.6 94.7  MCH 30.6   < > 30.3 30.3 31.0 30.2 29.8  MCHC 31.5   < > 31.2 31.8 32.4 31.6 31.5  RDW 12.9   < > 12.1 12.3 12.5 12.6 12.5  LYMPHSABS 0.8  --   --  0.9 1.0 2.1 2.4  MONOABS 0.2  --   --  0.8 1.2* 1.4* 1.1*  EOSABS 0.0  --   --  0.0 0.0 0.0 0.1  BASOSABS 0.0  --   --  0.0 0.0 0.0 0.1   < > = values in this interval not displayed.    Chemistries  Recent Labs  Lab 07/29/19 1400 07/29/19 1400 07/29/19 1415 07/29/19 1828 07/30/19 1046 07/31/19 0610 08/01/19 0604 08/02/19 0335 08/03/19 0336  NA 139   < > 138   < > 144 144 144 143 145  K 4.3   < > 5.1   < > 4.2 3.9 4.5 4.2 3.9  CL 99   < >  --   --  101 106 110 108 107  CO2 19*   < >  --   --  _0 21* 25  GLUCOSE 129*   < >  --   --  104* 142* 133* 88 74  BUN 19   < >  --   --  19 31* 33* 34* 27*  CREATININE 1.21   < >  --   --  1.03 1.32* 1.33* 1.47* 1.23  CALCIUM 9.0   < >  --   --  9.0 8.2* 7.9* 8.2* 8.2*  AST 44*  --   --   --   --  39 36 36 26  ALT 26  --   --   --   --  _1 ALKPHOS 52  --   --   --   --  38 48 61 51  BILITOT 1.8*  --   --   --   --  1.1 1.2 1.5* 1.4*  MG  --   --   --   --  1.7 1.5* 2.2 2.1 1.9  AMMONIA  --   --  29  --   --   --   --   --   --    < > = values in this interval not displayed.     ------------------------------------------------------------------------------------------------------------------ No results for input(s): CHOL, HDL, LDLCALC, TRIG, CHOLHDL, LDLDIRECT in the last 72 hours.  Lab Results  Component Value Date   HGBA1C 5.1 12/14/2014   ------------------------------------------------------------------------------------------------------------------ No results for input(s): TSH, T4TOTAL, T3FREE, THYROIDAB in the last 72 hours.  Invalid input(s): FREET3  Cardiac Enzymes No results for input(s): CKMB,  TROPONINI, MYOGLOBIN in the last 168 hours.  Invalid input(s): CK ------------------------------------------------------------------------------------------------------------------    Component Value Date/Time   BNP 200.3 (H) 08/03/2019 0735    Micro Results Recent Results (from the past 240 hour(s))  Urine culture     Status: Abnormal   Collection Time: 07/29/19  6:09 AM   Specimen: Urine, Random  Result Value Ref Range Status   Specimen Description URINE, RANDOM  Final   Special Requests   Final    NONE Performed at [facility], [address], [place], [place] [address]    Culture MULTIPLE SPECIES PRESENT, SUGGEST RECOLLECTION (A)  Final   Report Status 07/30/2019 FINAL  Final  Respiratory Panel by RT PCR (Flu A&B, Covid) - Nasopharyngeal Swab     Status: Abnormal   Collection Time: 07/29/19  4:30 PM   Specimen: Nasopharyngeal Swab  Result Value Ref Range Status   SARS Coronavirus 2 by RT PCR POSITIVE (A) NEGATIVE Final    Comment: RESULT CALLED TO, READ BACK BY AND VERIFIED WITHRhona Leavens RN 1817 07/29/19 A BROWNING (NOTE) SARS-CoV-2 target nucleic acids are DETECTED. SARS-CoV-2 RNA is generally detectable in upper respiratory  specimens  during the acute phase of infection. Positive results are indicative of the presence of the identified virus, but do not rule out bacterial infection or co-infection with other pathogens not detected by the test. Clinical correlation with patient history and other diagnostic information is necessary to determine patient infection status. The expected result is Negative. Fact Sheet for Patients:  PinkCheek.be Fact Sheet for Healthcare Providers: GravelBags.it This test is not yet approved or cleared by the [place] FDA and  has been authorized for detection and/or diagnosis of SARS-CoV-2 by FDA under an Emergency Use Authorization (EUA).  This EUA will remain in effect  (meaning this test can be used) for  the duration of  the COVID-19 declaration under Section 564(b)(1) of the Act, 21 U.S.C. section 360bbb-3(b)(1), unless the authorization is terminated or revoked sooner.    Influenza A by PCR NEGATIVE NEGATIVE Final   Influenza B by PCR NEGATIVE NEGATIVE Final    Comment: (NOTE) The Xpert Xpress SARS-CoV-2/FLU/RSV assay is intended as an aid in  the diagnosis of influenza from Nasopharyngeal swab specimens and  should not be used as a sole basis for treatment. Nasal washings and  aspirates are unacceptable for Xpert Xpress SARS-CoV-2/FLU/RSV  testing. Fact Sheet for Patients: PinkCheek.be Fact Sheet for Healthcare Providers: GravelBags.it This test is not yet approved or cleared by the [place] FDA and  has been authorized for detection and/or diagnosis of SARS-CoV-2 by  FDA under an Emergency Use Authorization (EUA). This EUA will remain  in effect (meaning this test can be used) for the duration of the  Covid-19 declaration under Section 564(b)(1) of the Act, 21  U.S.C. section 360bbb-3(b)(1), unless the authorization is  terminated or revoked. Performed at [facility], [place] [address] [place], [place] [address]   MRSA PCR Screening     Status: None   Collection Time: 08/02/19  5:39 AM   Specimen: Nasal Mucosa; Nasopharyngeal  Result Value Ref Range Status   MRSA by PCR NEGATIVE NEGATIVE Final    Comment:        The GeneXpert MRSA Assay (FDA approved for NASAL specimens only), is one component of a comprehensive MRSA colonization surveillance program. It is not intended to diagnose MRSA infection nor to guide or monitor treatment for MRSA infections. Performed at [facility], [place] [address] [place],  [address]     Radiology Reports EEG  Result Date: 07/29/2019 Lora Havens, MD     07/29/2019  4:15 PM Patient Name: Jefrey Raburn MRN: [id number]  Epilepsy Attending: Lora Havens Referring Physician/Provider: Etta Quill, PA Date: 07/29/2019 Duration: 26.07 minutes Patient history: 85 year old male with history of alcohol withdrawal seizures, dementia who presented after seizure-like episode.  EEG to evaluate for seizures. Level of alertness: Lethargic AEDs during EEG study: Ativan, Depakote Technical aspects: This EEG study was done with scalp electrodes positioned according to the 10-20 International system of electrode placement. Electrical activity was acquired at a sampling rate of _0  and reviewed with a high frequency filter of _1  and a low frequency filter of _2 . EEG data were recorded continuously and digitally stored. Description: No clear posterior dominant was seen.  EEG showed continuous generalized low amplitude 2 to 3 Hz delta slowing.  Hyperventilation and photic stimulation were not performed.  Of note, EEG was technically difficult secondary to significant myogenic artifact. Abnormality - Continuous slow, generalized IMPRESSION: This technically difficult study is suggestive of moderate to severe diffuse encephalopathy, nonspecific to etiology.  No seizures or epileptiform discharges were seen throughout the recording. Lora Havens   CT Angio Head W or Wo Contrast  Result Date: 07/29/2019 CLINICAL DATA:  Encephalopathy. Additional history provided: Unresponsive episode, at renal respirations, CPR, seizure EN route EXAM: CT ANGIOGRAPHY HEAD AND NECK TECHNIQUE: Multidetector CT imaging of the head and neck was performed using the standard protocol during bolus administration of intravenous contrast. Multiplanar CT image reconstructions and MIPs were obtained to evaluate the vascular anatomy. Carotid stenosis measurements (when applicable) are obtained utilizing NASCET criteria, using the distal internal carotid diameter as the denominator. CONTRAST:  83m OMNIPAQUE IOHEXOL 350 MG/ML SOLN COMPARISON:  Noncontrast head CT  07/29/2019 FINDINGS: CTA NECK FINDINGS The examination is mildly motion degraded. Aortic arch: Common origin of the innominate and left common carotid arteries. The visualized aortic arch is unremarkable. No innominate or proximal subclavian artery stenosis. Right carotid system: CCA and ICA patent within the neck without stenosis. Left carotid system: CCA and ICA patent within the neck without stenosis. Vertebral arteries: The right vertebral artery is dominant. The vertebral arteries are patent within the neck bilaterally without stenosis. Skeleton: No acute bony abnormality or aggressive osseous lesion identified. Cervical spondylosis without high-grade bony spinal canal narrowing. Other neck: No neck mass or cervical lymphadenopathy. Upper chest: No consolidation within the imaged lung apices. Review of the MIP images confirms the above findings CTA HEAD FINDINGS Anterior circulation: The intracranial internal carotid arteries are patent without stenosis. The M1 middle cerebral arteries are patent without significant stenosis. No M2 proximal branch occlusion or high-grade proximal stenosis is identified. The anterior cerebral arteries are patent without high-grade proximal stenosis. No intracranial aneurysm is identified. Posterior circulation: The dominant intracranial right vertebral artery is patent without stenosis and supplies the basilar artery. The non dominant intracranial left vertebral artery is markedly diminutive beyond the origin of the left PICA, although faintly patent. The basilar artery is patent without significant stenosis. Predominantly fetal origin of the right posterior cerebral artery which is patent without significant proximal stenosis. The left posterior cerebral artery is patent without significant proximal stenosis. A small left posterior communicating artery is present. Venous sinuses: Within limitations of contrast timing, no convincing thrombus. Anatomic variants: As described  Review of the MIP images confirms the above findings IMPRESSION: CTA neck: The bilateral common carotid, internal carotid and vertebral arteries are patent within the neck without stenosis. The right vertebral artery is dominant. CTA head: 1. No intracranial large vessel occlusion. 2. The intracranial left vertebral artery is markedly diminutive beyond the origin of the left PICA. This is suspected largely on a developmental basis given significant non dominance. A significant V4 left vertebral artery stenosis cannot be definitively excluded. The dominant intracranial right vertebral artery is widely patent and supplies the basilar artery. 3. Otherwise, no evidence of proximal high-grade arterial stenosis. Electronically Signed   By: KKellie SimmeringDO   On: 07/29/2019 15:23   CT HEAD WO CONTRAST  Result Date: 07/29/2019 CLINICAL DATA:  Encephalopathy. EXAM: CT HEAD WITHOUT CONTRAST TECHNIQUE: Contiguous axial images were obtained from the base of the skull through the vertex without intravenous contrast. COMPARISON:  Head CT 03/07/2015 FINDINGS: Brain: There is no evidence of acute intracranial hemorrhage, intracranial mass, midline shift or extra-axial fluid collection.No demarcated cortical infarction. Moderate ill-defined hypoattenuation within the cerebral white matter is similar to prior examination 03/07/2015 and nonspecific, but consistent with chronic small vessel ischemic disease. Moderate generalized parenchymal atrophy has progressed. Vascular: No hyperdense vessel.  Atherosclerotic  calcifications. Skull: Normal. Negative for fracture or focal lesion. Sinuses/Orbits: Visualized orbits demonstrate no acute abnormality. No significant paranasal sinus disease or mastoid effusion at the imaged levels. Other: Partially visualized support tubes. IMPRESSION: No evidence of acute intracranial abnormality. Moderate chronic small vessel ischemic disease, stable as compared to head CT 03/07/2015. Moderate  generalized parenchymal atrophy has progressed. Electronically Signed   By: Kellie Simmering DO   On: 07/29/2019 14:16   CT Angio Neck W and/or Wo Contrast  Result Date: 07/29/2019 CLINICAL DATA:  Encephalopathy. Additional history provided: Unresponsive episode, at renal respirations, CPR, seizure EN route EXAM: CT ANGIOGRAPHY HEAD AND NECK TECHNIQUE: Multidetector CT imaging of the head and neck was performed using the standard protocol during bolus administration of intravenous contrast. Multiplanar CT image reconstructions and MIPs were obtained to evaluate the vascular anatomy. Carotid stenosis measurements (when applicable) are obtained utilizing NASCET criteria, using the distal internal carotid diameter as the denominator. CONTRAST:  93m OMNIPAQUE IOHEXOL 350 MG/ML SOLN COMPARISON:  Noncontrast head CT 07/29/2019 FINDINGS: CTA NECK FINDINGS The examination is mildly motion degraded. Aortic arch: Common origin of the innominate and left common carotid arteries. The visualized aortic arch is unremarkable. No innominate or proximal subclavian artery stenosis. Right carotid system: CCA and ICA patent within the neck without stenosis. Left carotid system: CCA and ICA patent within the neck without stenosis. Vertebral arteries: The right vertebral artery is dominant. The vertebral arteries are patent within the neck bilaterally without stenosis. Skeleton: No acute bony abnormality or aggressive osseous lesion identified. Cervical spondylosis without high-grade bony spinal canal narrowing. Other neck: No neck mass or cervical lymphadenopathy. Upper chest: No consolidation within the imaged lung apices. Review of the MIP images confirms the above findings CTA HEAD FINDINGS Anterior circulation: The intracranial internal carotid arteries are patent without stenosis. The M1 middle cerebral arteries are patent without significant stenosis. No M2 proximal branch occlusion or high-grade proximal stenosis is identified.  The anterior cerebral arteries are patent without high-grade proximal stenosis. No intracranial aneurysm is identified. Posterior circulation: The dominant intracranial right vertebral artery is patent without stenosis and supplies the basilar artery. The non dominant intracranial left vertebral artery is markedly diminutive beyond the origin of the left PICA, although faintly patent. The basilar artery is patent without significant stenosis. Predominantly fetal origin of the right posterior cerebral artery which is patent without significant proximal stenosis. The left posterior cerebral artery is patent without significant proximal stenosis. A small left posterior communicating artery is present. Venous sinuses: Within limitations of contrast timing, no convincing thrombus. Anatomic variants: As described Review of the MIP images confirms the above findings IMPRESSION: CTA neck: The bilateral common carotid, internal carotid and vertebral arteries are patent within the neck without stenosis. The right vertebral artery is dominant. CTA head: 1. No intracranial large vessel occlusion. 2. The intracranial left vertebral artery is markedly diminutive beyond the origin of the left PICA. This is suspected largely on a developmental basis given significant non dominance. A significant V4 left vertebral artery stenosis cannot be definitively excluded. The dominant intracranial right vertebral artery is widely patent and supplies the basilar artery. 3. Otherwise, no evidence of proximal high-grade arterial stenosis. Electronically Signed   By: KKellie SimmeringDO   On: 07/29/2019 15:23   CT Chest Wo Contrast  Result Date: 07/15/2019 CLINICAL DATA:  Chronic dyspnea. EXAM: CT CHEST WITHOUT CONTRAST TECHNIQUE: Multidetector CT imaging of the chest was performed following the standard protocol without IV contrast. COMPARISON:  September 09, 2014. FINDINGS: Cardiovascular: No significant vascular findings. Normal heart size. No  pericardial effusion. Mediastinum/Nodes: No enlarged mediastinal or axillary lymph nodes. Thyroid gland, trachea, and esophagus demonstrate no significant findings. Lungs/Pleura: No pneumothorax or pleural effusion is noted. Left lung is clear. Mild right posterior basilar subsegmental atelectasis is noted. Upper Abdomen: Cholelithiasis is noted. Musculoskeletal: No chest wall mass or suspicious bone lesions identified. IMPRESSION: 1. Mild right posterior basilar subsegmental atelectasis. 2. Cholelithiasis. Electronically Signed   By: Marijo Conception M.D.   On: 07/15/2019 13:38   MR BRAIN WO CONTRAST  Result Date: 07/29/2019 CLINICAL DATA:  Seizure with subsequent mental status changes. EXAM: MRI HEAD WITHOUT CONTRAST TECHNIQUE: Multiplanar, multiecho pulse sequences of the brain and surrounding structures were obtained without intravenous contrast. COMPARISON:  CT studies earlier same day. FINDINGS: Brain: Diffusion imaging does not show any acute or subacute infarction. There is generalized brain atrophy. No focal abnormality affects the brainstem or cerebellum. The cerebral hemispheres show chronic small-vessel ischemic changes of the white matter. No large vessel territory infarction. No mass lesion, hemorrhage, hydrocephalus or extra-axial collection. No focal mesial temporal lesion. There is considerable temporal lobe atrophy. Vascular: Major vessels at the base of the brain show flow. Skull and upper cervical spine: Negative Sinuses/Orbits: Clear/normal Other: None IMPRESSION: No acute or reversible finding. Generalized brain atrophy, with some temporal lobe predominance. Chronic small-vessel ischemic changes of the cerebral hemispheric white matter. Electronically Signed   By: Nelson Chimes M.D.   On: 07/29/2019 18:13   DG Chest Port 1 View  Result Date: 08/01/2019 CLINICAL DATA:  Shortness of breath EXAM: PORTABLE CHEST 1 VIEW COMPARISON:  July 30, 2019 FINDINGS: A portion of the right apex is  obscured by the patient's mandible. There is ill-defined opacity in each lung base, a new finding. Heart size and pulmonary vascular normal. No adenopathy. Old healed rib fractures on the left again noted. Underlying osteoporosis noted. IMPRESSION: Ill-defined opacity in the lung bases, concerning for early pneumonia in these regions. Aspiration could present similarly. Lungs elsewhere clear. Heart size normal. Bones osteoporotic. Electronically Signed   By: Lowella Grip III M.D.   On: 08/01/2019 08:40   DG Chest Port 1 View  Result Date: 07/30/2019 CLINICAL DATA:  Shortness of breath, COVID symptoms EXAM: PORTABLE CHEST 1 VIEW COMPARISON:  07/29/2019 FINDINGS: The heart size and mediastinal contours are within normal limits. Both lungs are clear. Callused fracture deformities of the left fifth and sixth ribs again noted. Acute fractures are not appreciated. IMPRESSION: No acute abnormality of the lungs in AP portable projection. Electronically Signed   By: Eddie Candle M.D.   On: 07/30/2019 08:39   DG Chest Port 1 View  Result Date: 07/29/2019 CLINICAL DATA:  Altered level of consciousness, CPR EXAM: PORTABLE CHEST 1 VIEW COMPARISON:  07/29/2019 at 0211 hours FINDINGS: The heart size and mediastinal contours are within normal limits. Linear right basilar atelectasis. Lungs otherwise clear. Probable nondisplaced rib fractures involving the lateral aspects of the left third, fourth, and fifth ribs. No pneumothorax. IMPRESSION: Probable nondisplaced rib fractures involving the lateral aspects of the left third, fourth, and fifth ribs. No pneumothorax. Electronically Signed   By: Davina Poke D.O.   On: 07/29/2019 14:03   DG Chest Port 1 View  Result Date: 07/29/2019 CLINICAL DATA:  85 year old male with shortness of breath. EXAM: PORTABLE CHEST 1 VIEW COMPARISON:  Chest radiograph dated 07/15/2019. FINDINGS: No focal consolidation, pleural effusion or pneumothorax. Right lung base  atelectasis/scarring. The  cardiac silhouette is within normal limits. Osteopenia with degenerative changes of the spine. No acute osseous pathology. IMPRESSION: No acute cardiopulmonary process. Electronically Signed   By: Anner Crete M.D.   On: 07/29/2019 02:37   DG Chest Port 1 View  Result Date: 07/15/2019 CLINICAL DATA:  Increased difficulty breathing, cough and congestion for 2 weeks, history dementia, hypertension, colon cancer, former smoker EXAM: PORTABLE CHEST 1 VIEW COMPARISON:  Portable exam 1017 hours compared to 07/08/2019 FINDINGS: Normal heart size, mediastinal contours, and pulmonary vascularity. Subsegmental atelectasis at RIGHT base unchanged. Remaining lungs clear. No pleural effusion or pneumothorax. Bones demineralized. IMPRESSION: Chronic RIGHT basilar atelectasis without acute infiltrate. Electronically Signed   By: Lavonia Dana M.D.   On: 07/15/2019 11:01   DG Chest Port 1 View  Result Date: 07/08/2019 CLINICAL DATA:  Shortness of breath EXAM: PORTABLE CHEST 1 VIEW COMPARISON:  11/07/2018 FINDINGS: The heart size and mediastinal contours are within normal limits. No focal airspace consolidation, pleural effusion, or pneumothorax. The visualized skeletal structures are unremarkable. IMPRESSION: No active disease. Electronically Signed   By: Davina Poke D.O.   On: 07/08/2019 10:39   DG Abd Portable 1V  Result Date: 07/30/2019 CLINICAL DATA:  Altered mental status. Short of breath. Constipation. EXAM: PORTABLE ABDOMEN - 1 VIEW COMPARISON:  CT, 06/08/2018 FINDINGS: Normal bowel gas pattern.  No significant increase colonic stool. No evidence of renal ureteral stones. Soft tissues are unremarkable. Stable right hip hemiarthroplasty.  No acute skeletal abnormality. IMPRESSION: 1. No acute findings.  No evidence of bowel obstruction. 2. No significant increase colonic stool burden. Electronically Signed   By: Lajean Manes M.D.   On: 07/30/2019 09:24   Overnight EEG with  video  Result Date: 07/31/2019 Lora Havens, MD     07/31/2019 12:01 PM Patient Name: [name] MRN: [id number] Epilepsy Attending: Lora Havens Referring Physician/Provider: D Duration: 07/30/2019 1531 to 07/31/2019 1027 Duration of study: 19 hours  Patient history: 85 year old male with history of alcohol withdrawal seizures, dementia who presented after seizure-like episode.  EEG to evaluate for seizures.  Level of alertness: Lethargic, asleep  AEDs during EEG study: Keppra  Technical aspects: This EEG study was done with scalp electrodes positioned according to the 10-20 International system of electrode placement. Electrical activity was acquired at a sampling rate of _0  and reviewed with a high frequency filter of _1  and a low frequency filter of _2 . EEG data were recorded continuously and digitally stored.  Description: During awake state, no clear posterior dominant was seen.  Sleep was characterized by vertex waves, sleep spindles (12 to 14 Hz), maximal frontocentral. EEG showed continuous generalized 3 to 6 Hz theta-delta slowing.  Hyperventilation and photic stimulation were not performed.   Abnormality - Continuous slow, generalized  IMPRESSION: This study is suggestive of moderate diffuse encephalopathy, nonspecific to etiology. No seizures or epileptiform discharges were seen throughout the recording.  Lora Havens   ECHOCARDIOGRAM LIMITED  Result Date: 07/30/2019    ECHOCARDIOGRAM LIMITED REPORT   Patient Name:   KENGO STURGES Date of Exam: 07/30/2019 Medical Rec #:  [id number]  Height:       64.0 in Accession #:    [id number] Weight:       119.0 lb Date of Birth:  08/06/1934   BSA:          1.569 m Patient Age:    69 years   BP:           116/74 mmHg Patient  Gender: M          HR:           68 bpm. Exam Location:  Inpatient Procedure: Limited Echo Indications:    786.09 dyspnea  History:        Patient has no prior history of Echocardiogram examinations.                 Risk  Factors:Hypertension. Hx of alcohol dependence. Covid +.  Sonographer:    Jannett Celestine RDCS (AE) Referring Phys: [id number] Lequita Halt  Sonographer Comments: No parasternal window. patient in a constricted position (very limited mobility). IMPRESSIONS  1. Technically difficult study with limited views  2. Left ventricular ejection fraction, by estimation, is 65 to 70%. The left ventricle has normal function.  3. Right ventricle is not well visualized  4. The mitral valve is normal in structure. No evidence of mitral valve regurgitation.  5. The aortic valve was not well visualized. Aortic valve regurgitation is not visualized. No aortic stenosis is present.  6. The inferior vena cava is normal in size with greater than 50% respiratory variability, suggesting right atrial pressure of 3 mmHg. FINDINGS  Left Ventricle: Left ventricular ejection fraction, by estimation, is 65 to 70%. The left ventricle has normal function. Right Ventricle: The right ventricular size is not well visualized. Right ventricular systolic function was not well visualized. Pericardium: Trivial pericardial effusion is present. Mitral Valve: The mitral valve is normal in structure. Tricuspid Valve: The tricuspid valve is not well visualized. Aortic Valve: The aortic valve was not well visualized. Aortic valve regurgitation is not visualized. No aortic stenosis is present. Pulmonic Valve: The pulmonic valve was not well visualized. Venous: The inferior vena cava is normal in size with greater than 50% respiratory variability, suggesting right atrial pressure of 3 mmHg. Oswaldo Milian MD Electronically signed by Oswaldo Milian MD Signature Date/Time: 07/30/2019/8:17:32 PM    Final

## 2019-08-04 NOTE — Consult Note (Signed)
Consultation Note Date: 08/04/2019   Patient Name: [name]  DOB: 1934/08/14  MRN: [id number]  Age / Sex: 85 y.o., male  PCP: Sande Brothers, MD Referring Physician: Thurnell Lose, MD  Reason for Consultation: Inpatient hospice referral and Psychosocial/spiritual support  HPI/Patient Profile: 85 y.o. male  with past medical history of seizures, vascular dementia, colon cancer, arthritis and Covid 19.  According to his SNF he has had both Covid vaccines.  He was admitted on 07/29/2019 with seizures and found to be Covid positive.  He has subsequently been aspirating and has significantly decreased responsiveness.  He will attempt to take a bite or a sip but does not swallow effectively.  Clinical Assessment and Goals of Care:  I have reviewed medical records including EPIC notes, labs and imaging, received report from Dr. Candiss Norse, examined the patient and spoke on the phone with his God daughter [name]  to discuss diagnosis prognosis, [place], EOL wishes, disposition and options.  I introduced Palliative Medicine as specialized medical care for people living with serious illness. It focuses on providing relief from the symptoms and stress of a serious illness.   We discussed a brief life review of the patient.  [name] explains that her God father [name]) raised her after her father died of cancer.  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  He loves "golden oldies" on the radio and loves to drink coffee.  He never complains and was never a big talker.  He has always been a mild, gentle soul.  As far as functional and nutritional status prior to admission [name] was conversant and able to ambulate according to [facility].  He is no longer able to do so.  He is not eating unless fed and then perhaps only a taste of something after which he aspirates.  We discussed her current illness and what it means in the larger context of  her on-going co-morbidities.  Natural disease trajectory and expectations at EOL were discussed. Hospice and Palliative Care services outpatient were explained and offered.  Questions and concerns were addressed.  The family was encouraged to call with questions or concerns.    Primary Decision Maker:  NEXT OF KIN  Daughter [name]    SUMMARY OF RECOMMENDATIONS    Will shift to comfort measures. Requested bed at [facility].  TOC order placed.  Discussed with Venia Carbon.  Code Status/Advance Care Planning:  DNR   Symptom Management:   Robinul scheduled and PRN  PRN very low dose dilaudid for dyspnea or pain  Good oral care.  Additional Recommendations (Limitations, Scope, Preferences):  Avoid Hospitalization and Full Comfort Care  Palliative Prophylaxis:   Aspiration and Frequent Pain Assessment  Psycho-social/Spiritual:   Desire for further Chaplaincy support: Not discussed.  Prognosis:  Less than 2 weeks as patient is not able to sustain himself.    Discharge Planning: Hospice facility      Primary Diagnoses: Present on Admission: **None**   I have reviewed the medical record, interviewed the patient and family, and examined the  patient. The following aspects are pertinent.  Past Medical History:  Diagnosis Date  . Alcohol dependence ([place])    Associated with thrombocytopenia, anemia, elevated LFTs, hypomagnesemia  . Anemia   . Arthritis   . Colon cancer ([place])   . Dementia ([place])    Vascular and alcohol related.  Marland Kitchen HTN (hypertension)   . Seizures ([place])    Related to withdrawal, no seizure in years   Social History   Socioeconomic History  . Marital status: Single    Spouse name: Not on file  . Number of children: 2  . Years of education: Not on file  . Highest education level: Not on file  Occupational History  . Occupation: truck Education administrator: UNEMPLOYED  Tobacco Use  . Smoking status: Former Smoker    Packs/day: 1.00    Quit  date: 12/14/1975    Years since quitting: 43.6  . Smokeless tobacco: Never Used  Substance and Sexual Activity  . Alcohol use: Yes    Alcohol/week: 0.0 standard drinks    Comment: USED TO DRINK BEER REGULARLY NOW DRINKS ABOUT 2 BEERS PER MONTH  . Drug use: No  . Sexual activity: Not on file  Other Topics Concern  . Not on file  Social History Narrative   Lives in facility, divorced and has no children, in past was noted to check himself out and come back with altered mental status post alcohol binge.   Currently at [facility] in [place] are family friends   Retired from Therapist, sports truck for [employer]   Has dementia   Social Determinants of Radio broadcast assistant Strain:   . Difficulty of Paying Living Expenses:   Food Insecurity:   . Worried About Charity fundraiser in the Last Year:   . Arboriculturist in the Last Year:   Transportation Needs:   . Film/video editor (Medical):   Marland Kitchen Lack of Transportation (Non-Medical):   Physical Activity:   . Days of Exercise per Week:   . Minutes of Exercise per Session:   Stress:   . Feeling of Stress :   Social Connections:   . Frequency of Communication with Friends and Family:   . Frequency of Social Gatherings with Friends and Family:   . Attends Religious Services:   . Active Member of Clubs or Organizations:   . Attends Archivist Meetings:   Marland Kitchen Marital Status:    Family History  Problem Relation Age of Onset  . Colon cancer Neg Hx    Scheduled Meds: . divalproex  500 mg Oral Q12H  . heparin  5,000 Units Subcutaneous Q8H  . ipratropium  2 puff Inhalation TID  . mouth rinse  15 mL Mouth Rinse BID   Continuous Infusions: . sodium chloride 10 mL/hr at 08/02/19 0700  . ampicillin-sulbactam (UNASYN) IV 3 g (08/04/19 0816)  . levETIRAcetam 500 mg (08/04/19 0703)   PRN Meds:.sodium chloride, acetaminophen **OR** acetaminophen, guaiFENesin, guaiFENesin-dextromethorphan,  hydrALAZINE, latanoprost, LORazepam, [DISCONTINUED] ondansetron **OR** ondansetron (ZOFRAN) IV, Resource ThickenUp Clear No Known Allergies  Review of Systems  Patient is not verbal  Physical Exam  Elderly gentleman, awake, but stares off as if in a dream.   After several minutes of working with him he finally makes eye contact with me and barely nods. CV rrr (faint sounds) Resp wet gurgling throat noises, bronchial sounds.  Patient does inspire deeply.  No distress Abdomen soft, nt   Vital Signs:  BP (!) 156/69 (BP Location: Right Arm)   Pulse (!) 56   Temp 97.9 F (36.6 C) (Oral)   Resp 18   Ht 5\' 4"  (1.626 m)   Wt 54 kg Comment: Per recent visit <1 month ago  SpO2 97%   BMI 20.43 kg/m  Pain Scale: Faces POSS *See Group Information*: 2-Acceptable,Slightly drowsy, easily aroused Pain Score: 0-No pain   SpO2: SpO2: 97 % O2 Device:SpO2: 97 % O2 Flow Rate: .O2 Flow Rate (L/min): 15 L/min  IO: Intake/output summary:   Intake/Output Summary (Last 24 hours) at 08/04/2019 0945 Last data filed at 08/03/2019 1426 Gross per 24 hour  Intake 220 ml  Output 900 ml  Net -680 ml    LBM: Last BM Date: (PTA) Baseline Weight: Weight: 54 kg(Per recent visit <1 month ago) Most recent weight: Weight: 54 kg(Per recent visit <1 month ago)     Palliative Assessment/Data: 10%     Time In: 9:00 Time Out: 10:13 Time Total: 73 min. Visit consisted of counseling and education dealing with the complex and emotionally intense issues surrounding the need for palliative care and symptom management in the setting of serious and potentially life-threatening illness. Greater than 50%  of this time was spent counseling and coordinating care related to the above assessment and plan.  Signed by: Florentina Jenny, PA-C Palliative Medicine  Please contact Palliative Medicine Team phone at [phone] for questions and concerns.  For individual provider: See Shea Evans

## 2019-08-05 MED ORDER — DIVALPROEX SODIUM 125 MG PO CSDR: 500.0000 mg | DELAYED_RELEASE_CAPSULE | Freq: Two times a day (BID) | ORAL | Status: AC

## 2019-08-05 NOTE — Discharge Summary (Signed)
[name] XID:[id number] DOB: 1935/02/12 DOA: 07/29/2019  PCP: Sande Brothers, MD  Admit date: 07/29/2019  Discharge date: 08/05/2019  Admitted From: SNF   Disposition:  Residential Hospice   Recommendations for Outpatient Follow-up:   Follow up with PCP in 1-2 weeks  PCP Please obtain BMP/CBC, 2 view CXR in 1week,  (see Discharge instructions)   PCP Please follow up on the following pending results:    Home Health: None   Equipment/Devices: None  Consultations: Pall. Care Discharge Condition: Guarded   CODE STATUS: DNR  Diet Recommendation: Soft diet and honey thick liquids with full feeding assistance and aspiration precautions as desired by the patient.  He hardly eats anything and is refusing most of the food.  Diet Order            DIET - DYS 1 Room service appropriate? No; Fluid consistency: Honey Thick  Diet effective now               Chief Complaint  Patient presents with  . Altered Mental Status     Brief history of present illness from the day of admission and additional interim summary    [name] a 85 y.o.malewith medical history significant ofremote history of alcohol dependence, hypertension, seizure, dementia, presented with unresponsiveness, he lives at a memory care unit and was noted to be less responsive for 24 to 48 hours prior to hospital visit.  In the ER he had a visualized tonic-clonic seizure, he was also found to have COVID-19 pneumonia along with a suspicion of aspiration pneumonia.  He was seen by neurology and admitted for further care.  Due to his poor baseline and refusal to eat palliative care was consulted and daughter was involved in decision-making, it was decided that he will be best served with residential hospice with goal of care being comfort.  He will be  discharged to residential hospice at this time.                                                                 Hospital Course   1. Acute Hypoxic Resp. Failure due to Acute Covid 19 Viral Pneumonitis during the ongoing 2020 Covid 19 Pandemic - he has mild to moderate disease at best, was treated with steroids and remdesivir has finished course, currently stable on 2 L nasal cannula for comfort...   Recent Labs  Lab 07/29/19 1630 07/30/19 1046 07/31/19 0610 08/01/19 0604 08/02/19 0335 08/03/19 0336  CRP  --  7.1* 5.4* 5.2* 6.8* 7.0*  DDIMER  --  >20.00* 1.43* 1.63* 1.43* 1.31*  BNP  --  173.3* 39.8 3,862.1* 237.7* 200.3*  PROCALCITON  --  0.21  --   --   --   --   SARSCOV2NAA POSITIVE*  --   --   --   --   --  Hepatic Function Latest Ref Rng & Units 08/03/2019 08/02/2019 08/01/2019  Total Protein 6.5 - 8.1 g/dL 5.4(L) 5.8(L) 5.2(L)  Albumin 3.5 - 5.0 g/dL 2.0(L) 2.2(L) 2.0(L)  AST 15 - 41 U/L 26 36 36  ALT 0 - 44 U/L '20 26 23  ' Alk Phosphatase 38 - 126 U/L 51 61 48  Total Bilirubin 0.3 - 1.2 mg/dL 1.4(H) 1.5(H) 1.2  Bilirubin, Direct 0.0 - 0.3 mg/dL - - -    2.  Breakthrough seizures.  CT MRI and EEG were nonacute, he has now been started on oral Depakote by neurology and currently seizure-free in, note goal of care now is full comfort at residential hospice.  3.  Possible aspiration pneumonia.    Responded well to Unasyn seen by speech, soft diet with honey thick liquids, full feeding assistance and aspiration precautions at residential hospice.  4.  Hypertension.  Supportive care goal of care now comfort at residential hospice.      Discharge diagnosis     Active Problems:   Seizure ([place])   COVID-19   Comfort measures only status   Palliative care encounter    Discharge instructions    Discharge Instructions    Discharge instructions   Complete by: As directed    Disposition.  Residential hospice Condition.  Guarded CODE STATUS.  DNR Activity.  With  assistance as tolerated, full fall precautions. Diet.  Soft with feeding assistance and aspiration precautions. Goal of care.  Comfort.      Discharge Medications   Allergies as of 08/05/2019   No Known Allergies     Medication List    TAKE these medications   acetaminophen 500 MG tablet Commonly known as: TYLENOL Take 500 mg by mouth See admin instructions. Take 500 mg by mouth two times daily and an additional 500 mg every 4 hours as needed for headache, minor discomfort, and/or fever of 99.5-101 F (NOT TO EXCEED 2,000MG/DAY FROM ALL COMBINED SOURCES)   divalproex 125 MG capsule Commonly known as: DEPAKOTE SPRINKLE Take 4 capsules (500 mg total) by mouth every 12 (twelve) hours. What changed:   how much to take  when to take this   guaifenesin 100 MG/5ML syrup Commonly known as: ROBITUSSIN Take 200 mg by mouth 4 (four) times daily as needed for cough. Reported on 11/11/2015   latanoprost 0.005 % ophthalmic solution Commonly known as: XALATAN Place 1 drop into both eyes at bedtime as needed ("as tolerated by patient").   loperamide 2 MG capsule Commonly known as: IMODIUM Take 2 mg by mouth 4 (four) times daily as needed for diarrhea or loose stools.   magnesium hydroxide 400 MG/5ML suspension Commonly known as: MILK OF MAGNESIA Take 30 mLs by mouth at bedtime as needed (for constipation).   Mintox 974-163-84 MG/5ML suspension Generic drug: alum & mag hydroxide-simeth Take 30 mLs by mouth every 6 (six) hours as needed for indigestion or heartburn.   neomycin-bacitracin-polymyxin ointment Commonly known as: NEOSPORIN Apply 1 application topically daily as needed (for minor skin tears or abrasions- after cleansing with normal saline, then cover with band-aid or gauze & tape; change as needed until healed).   ondansetron 4 MG tablet Commonly known as: ZOFRAN Take 4 mg by mouth every 8 (eight) hours as needed for nausea or vomiting. Reported on 11/11/2015   vitamin  B-12 1000 MCG tablet Commonly known as: CYANOCOBALAMIN Take 1,000 mcg by mouth daily.         Major procedures and Radiology Reports - PLEASE review  detailed and final reports thoroughly  -       EEG  Result Date: 07/29/2019 Lora Havens, MD     07/29/2019  4:15 PM Patient Name: [name] MRN: [id number] Epilepsy Attending: Lora Havens Referring Physician/Provider: Etta Quill, PA Date: 07/29/2019 Duration: 26.07 minutes Patient history: 85 year old male with history of alcohol withdrawal seizures, dementia who presented after seizure-like episode.  EEG to evaluate for seizures. Level of alertness: Lethargic AEDs during EEG study: Ativan, Depakote Technical aspects: This EEG study was done with scalp electrodes positioned according to the 10-20 International system of electrode placement. Electrical activity was acquired at a sampling rate of '500Hz'  and reviewed with a high frequency filter of '70Hz'  and a low frequency filter of '1Hz' . EEG data were recorded continuously and digitally stored. Description: No clear posterior dominant was seen.  EEG showed continuous generalized low amplitude 2 to 3 Hz delta slowing.  Hyperventilation and photic stimulation were not performed.  Of note, EEG was technically difficult secondary to significant myogenic artifact. Abnormality - Continuous slow, generalized IMPRESSION: This technically difficult study is suggestive of moderate to severe diffuse encephalopathy, nonspecific to etiology. No seizures or epileptiform discharges were seen throughout the recording. Lora Havens   CT Angio Head W or Wo Contrast  Result Date: 07/29/2019 CLINICAL DATA:  Encephalopathy. Additional history provided: Unresponsive episode, at renal respirations, CPR, seizure EN route EXAM: CT ANGIOGRAPHY HEAD AND NECK TECHNIQUE: Multidetector CT imaging of the head and neck was performed using the standard protocol during bolus administration of intravenous contrast. Multiplanar  CT image reconstructions and MIPs were obtained to evaluate the vascular anatomy. Carotid stenosis measurements (when applicable) are obtained utilizing NASCET criteria, using the distal internal carotid diameter as the denominator. CONTRAST:  89m OMNIPAQUE IOHEXOL 350 MG/ML SOLN COMPARISON:  Noncontrast head CT 07/29/2019 FINDINGS: CTA NECK FINDINGS The examination is mildly motion degraded. Aortic arch: Common origin of the innominate and left common carotid arteries. The visualized aortic arch is unremarkable. No innominate or proximal subclavian artery stenosis. Right carotid system: CCA and ICA patent within the neck without stenosis. Left carotid system: CCA and ICA patent within the neck without stenosis. Vertebral arteries: The right vertebral artery is dominant. The vertebral arteries are patent within the neck bilaterally without stenosis. Skeleton: No acute bony abnormality or aggressive osseous lesion identified. Cervical spondylosis without high-grade bony spinal canal narrowing. Other neck: No neck mass or cervical lymphadenopathy. Upper chest: No consolidation within the imaged lung apices. Review of the MIP images confirms the above findings CTA HEAD FINDINGS Anterior circulation: The intracranial internal carotid arteries are patent without stenosis. The M1 middle cerebral arteries are patent without significant stenosis. No M2 proximal branch occlusion or high-grade proximal stenosis is identified. The anterior cerebral arteries are patent without high-grade proximal stenosis. No intracranial aneurysm is identified. Posterior circulation: The dominant intracranial right vertebral artery is patent without stenosis and supplies the basilar artery. The non dominant intracranial left vertebral artery is markedly diminutive beyond the origin of the left PICA, although faintly patent. The basilar artery is patent without significant stenosis. Predominantly fetal origin of the right posterior cerebral  artery which is patent without significant proximal stenosis. The left posterior cerebral artery is patent without significant proximal stenosis. A small left posterior communicating artery is present. Venous sinuses: Within limitations of contrast timing, no convincing thrombus. Anatomic variants: As described Review of the MIP images confirms the above findings IMPRESSION: CTA neck: The bilateral common carotid, internal carotid and  vertebral arteries are patent within the neck without stenosis. The right vertebral artery is dominant. CTA head: 1. No intracranial large vessel occlusion. 2. The intracranial left vertebral artery is markedly diminutive beyond the origin of the left PICA. This is suspected largely on a developmental basis given significant non dominance. A significant V4 left vertebral artery stenosis cannot be definitively excluded. The dominant intracranial right vertebral artery is widely patent and supplies the basilar artery. 3. Otherwise, no evidence of proximal high-grade arterial stenosis. Electronically Signed   By: Kellie Simmering DO   On: 07/29/2019 15:23   CT HEAD WO CONTRAST  Result Date: 07/29/2019 CLINICAL DATA:  Encephalopathy. EXAM: CT HEAD WITHOUT CONTRAST TECHNIQUE: Contiguous axial images were obtained from the base of the skull through the vertex without intravenous contrast. COMPARISON:  Head CT 03/07/2015 FINDINGS: Brain: There is no evidence of acute intracranial hemorrhage, intracranial mass, midline shift or extra-axial fluid collection.No demarcated cortical infarction. Moderate ill-defined hypoattenuation within the cerebral white matter is similar to prior examination 03/07/2015 and nonspecific, but consistent with chronic small vessel ischemic disease. Moderate generalized parenchymal atrophy has progressed. Vascular: No hyperdense vessel.  Atherosclerotic calcifications. Skull: Normal. Negative for fracture or focal lesion. Sinuses/Orbits: Visualized orbits demonstrate  no acute abnormality. No significant paranasal sinus disease or mastoid effusion at the imaged levels. Other: Partially visualized support tubes. IMPRESSION: No evidence of acute intracranial abnormality. Moderate chronic small vessel ischemic disease, stable as compared to head CT 03/07/2015. Moderate generalized parenchymal atrophy has progressed. Electronically Signed   By: Kellie Simmering DO   On: 07/29/2019 14:16   CT Angio Neck W and/or Wo Contrast  Result Date: 07/29/2019 CLINICAL DATA:  Encephalopathy. Additional history provided: Unresponsive episode, at renal respirations, CPR, seizure EN route EXAM: CT ANGIOGRAPHY HEAD AND NECK TECHNIQUE: Multidetector CT imaging of the head and neck was performed using the standard protocol during bolus administration of intravenous contrast. Multiplanar CT image reconstructions and MIPs were obtained to evaluate the vascular anatomy. Carotid stenosis measurements (when applicable) are obtained utilizing NASCET criteria, using the distal internal carotid diameter as the denominator. CONTRAST:  33m OMNIPAQUE IOHEXOL 350 MG/ML SOLN COMPARISON:  Noncontrast head CT 07/29/2019 FINDINGS: CTA NECK FINDINGS The examination is mildly motion degraded. Aortic arch: Common origin of the innominate and left common carotid arteries. The visualized aortic arch is unremarkable. No innominate or proximal subclavian artery stenosis. Right carotid system: CCA and ICA patent within the neck without stenosis. Left carotid system: CCA and ICA patent within the neck without stenosis. Vertebral arteries: The right vertebral artery is dominant. The vertebral arteries are patent within the neck bilaterally without stenosis. Skeleton: No acute bony abnormality or aggressive osseous lesion identified. Cervical spondylosis without high-grade bony spinal canal narrowing. Other neck: No neck mass or cervical lymphadenopathy. Upper chest: No consolidation within the imaged lung apices. Review of the  MIP images confirms the above findings CTA HEAD FINDINGS Anterior circulation: The intracranial internal carotid arteries are patent without stenosis. The M1 middle cerebral arteries are patent without significant stenosis. No M2 proximal branch occlusion or high-grade proximal stenosis is identified. The anterior cerebral arteries are patent without high-grade proximal stenosis. No intracranial aneurysm is identified. Posterior circulation: The dominant intracranial right vertebral artery is patent without stenosis and supplies the basilar artery. The non dominant intracranial left vertebral artery is markedly diminutive beyond the origin of the left PICA, although faintly patent. The basilar artery is patent without significant stenosis. Predominantly fetal origin of the right posterior cerebral  artery which is patent without significant proximal stenosis. The left posterior cerebral artery is patent without significant proximal stenosis. A small left posterior communicating artery is present. Venous sinuses: Within limitations of contrast timing, no convincing thrombus. Anatomic variants: As described Review of the MIP images confirms the above findings IMPRESSION: CTA neck: The bilateral common carotid, internal carotid and vertebral arteries are patent within the neck without stenosis. The right vertebral artery is dominant. CTA head: 1. No intracranial large vessel occlusion. 2. The intracranial left vertebral artery is markedly diminutive beyond the origin of the left PICA. This is suspected largely on a developmental basis given significant non dominance. A significant V4 left vertebral artery stenosis cannot be definitively excluded. The dominant intracranial right vertebral artery is widely patent and supplies the basilar artery. 3. Otherwise, no evidence of proximal high-grade arterial stenosis. Electronically Signed   By: Kellie Simmering DO   On: 07/29/2019 15:23   CT Chest Wo Contrast  Result Date:  07/15/2019 CLINICAL DATA:  Chronic dyspnea. EXAM: CT CHEST WITHOUT CONTRAST TECHNIQUE: Multidetector CT imaging of the chest was performed following the standard protocol without IV contrast. COMPARISON:  September 09, 2014. FINDINGS: Cardiovascular: No significant vascular findings. Normal heart size. No pericardial effusion. Mediastinum/Nodes: No enlarged mediastinal or axillary lymph nodes. Thyroid gland, trachea, and esophagus demonstrate no significant findings. Lungs/Pleura: No pneumothorax or pleural effusion is noted. Left lung is clear. Mild right posterior basilar subsegmental atelectasis is noted. Upper Abdomen: Cholelithiasis is noted. Musculoskeletal: No chest wall mass or suspicious bone lesions identified. IMPRESSION: 1. Mild right posterior basilar subsegmental atelectasis. 2. Cholelithiasis. Electronically Signed   By: Marijo Conception M.D.   On: 07/15/2019 13:38   MR BRAIN WO CONTRAST  Result Date: 07/29/2019 CLINICAL DATA:  Seizure with subsequent mental status changes. EXAM: MRI HEAD WITHOUT CONTRAST TECHNIQUE: Multiplanar, multiecho pulse sequences of the brain and surrounding structures were obtained without intravenous contrast. COMPARISON:  CT studies earlier same day. FINDINGS: Brain: Diffusion imaging does not show any acute or subacute infarction. There is generalized brain atrophy. No focal abnormality affects the brainstem or cerebellum. The cerebral hemispheres show chronic small-vessel ischemic changes of the white matter. No large vessel territory infarction. No mass lesion, hemorrhage, hydrocephalus or extra-axial collection. No focal mesial temporal lesion. There is considerable temporal lobe atrophy. Vascular: Major vessels at the base of the brain show flow. Skull and upper cervical spine: Negative Sinuses/Orbits: Clear/normal Other: None IMPRESSION: No acute or reversible finding. Generalized brain atrophy, with some temporal lobe predominance. Chronic small-vessel ischemic changes  of the cerebral hemispheric white matter. Electronically Signed   By: Nelson Chimes M.D.   On: 07/29/2019 18:13   DG Chest Port 1 View  Result Date: 08/01/2019 CLINICAL DATA:  Shortness of breath EXAM: PORTABLE CHEST 1 VIEW COMPARISON:  July 30, 2019 FINDINGS: A portion of the right apex is obscured by the patient's mandible. There is ill-defined opacity in each lung base, a new finding. Heart size and pulmonary vascular normal. No adenopathy. Old healed rib fractures on the left again noted. Underlying osteoporosis noted. IMPRESSION: Ill-defined opacity in the lung bases, concerning for early pneumonia in these regions. Aspiration could present similarly. Lungs elsewhere clear. Heart size normal. Bones osteoporotic. Electronically Signed   By: Lowella Grip III M.D.   On: 08/01/2019 08:40   DG Chest Port 1 View  Result Date: 07/30/2019 CLINICAL DATA:  Shortness of breath, COVID symptoms EXAM: PORTABLE CHEST 1 VIEW COMPARISON:  07/29/2019 FINDINGS: The heart  size and mediastinal contours are within normal limits. Both lungs are clear. Callused fracture deformities of the left fifth and sixth ribs again noted. Acute fractures are not appreciated. IMPRESSION: No acute abnormality of the lungs in AP portable projection. Electronically Signed   By: Eddie Candle M.D.   On: 07/30/2019 08:39   DG Chest Port 1 View  Result Date: 07/29/2019 CLINICAL DATA:  Altered level of consciousness, CPR EXAM: PORTABLE CHEST 1 VIEW COMPARISON:  07/29/2019 at 0211 hours FINDINGS: The heart size and mediastinal contours are within normal limits. Linear right basilar atelectasis. Lungs otherwise clear. Probable nondisplaced rib fractures involving the lateral aspects of the left third, fourth, and fifth ribs. No pneumothorax. IMPRESSION: Probable nondisplaced rib fractures involving the lateral aspects of the left third, fourth, and fifth ribs. No pneumothorax. Electronically Signed   By: Davina Poke D.O.   On:  07/29/2019 14:03   DG Chest Port 1 View  Result Date: 07/29/2019 CLINICAL DATA:  85 year old male with shortness of breath. EXAM: PORTABLE CHEST 1 VIEW COMPARISON:  Chest radiograph dated 07/15/2019. FINDINGS: No focal consolidation, pleural effusion or pneumothorax. Right lung base atelectasis/scarring. The cardiac silhouette is within normal limits. Osteopenia with degenerative changes of the spine. No acute osseous pathology. IMPRESSION: No acute cardiopulmonary process. Electronically Signed   By: Anner Crete M.D.   On: 07/29/2019 02:37   DG Chest Port 1 View  Result Date: 07/15/2019 CLINICAL DATA:  Increased difficulty breathing, cough and congestion for 2 weeks, history dementia, hypertension, colon cancer, former smoker EXAM: PORTABLE CHEST 1 VIEW COMPARISON:  Portable exam 1017 hours compared to 07/08/2019 FINDINGS: Normal heart size, mediastinal contours, and pulmonary vascularity. Subsegmental atelectasis at RIGHT base unchanged. Remaining lungs clear. No pleural effusion or pneumothorax. Bones demineralized. IMPRESSION: Chronic RIGHT basilar atelectasis without acute infiltrate. Electronically Signed   By: Lavonia Dana M.D.   On: 07/15/2019 11:01   DG Chest Port 1 View  Result Date: 07/08/2019 CLINICAL DATA:  Shortness of breath EXAM: PORTABLE CHEST 1 VIEW COMPARISON:  11/07/2018 FINDINGS: The heart size and mediastinal contours are within normal limits. No focal airspace consolidation, pleural effusion, or pneumothorax. The visualized skeletal structures are unremarkable. IMPRESSION: No active disease. Electronically Signed   By: Davina Poke D.O.   On: 07/08/2019 10:39   DG Abd Portable 1V  Result Date: 07/30/2019 CLINICAL DATA:  Altered mental status. Short of breath. Constipation. EXAM: PORTABLE ABDOMEN - 1 VIEW COMPARISON:  CT, 06/08/2018 FINDINGS: Normal bowel gas pattern.  No significant increase colonic stool. No evidence of renal ureteral stones. Soft tissues are  unremarkable. Stable right hip hemiarthroplasty.  No acute skeletal abnormality. IMPRESSION: 1. No acute findings.  No evidence of bowel obstruction. 2. No significant increase colonic stool burden. Electronically Signed   By: Lajean Manes M.D.   On: 07/30/2019 09:24   Overnight EEG with video  Result Date: 07/31/2019 Lora Havens, MD     07/31/2019 12:01 PM Patient Name: Cadarius Nevares MRN: [id number] Epilepsy Attending: Lora Havens Referring Physician/Provider: D Duration: 07/30/2019 1531 to 07/31/2019 1027 Duration of study: 19 hours  Patient history: 85 year old male with history of alcohol withdrawal seizures, dementia who presented after seizure-like episode.  EEG to evaluate for seizures.  Level of alertness: Lethargic, asleep  AEDs during EEG study: Keppra  Technical aspects: This EEG study was done with scalp electrodes positioned according to the 10-20 International system of electrode placement. Electrical activity was acquired at a sampling rate of '500Hz'  and reviewed  with a high frequency filter of '70Hz'  and a low frequency filter of '1Hz' . EEG data were recorded continuously and digitally stored.  Description: During awake state, no clear posterior dominant was seen.  Sleep was characterized by vertex waves, sleep spindles (12 to 14 Hz), maximal frontocentral. EEG showed continuous generalized 3 to 6 Hz theta-delta slowing.  Hyperventilation and photic stimulation were not performed.   Abnormality - Continuous slow, generalized  IMPRESSION: This study is suggestive of moderate diffuse encephalopathy, nonspecific to etiology. No seizures or epileptiform discharges were seen throughout the recording.  Lora Havens   ECHOCARDIOGRAM LIMITED  Result Date: 07/30/2019    ECHOCARDIOGRAM LIMITED REPORT   Patient Name:   LUCAH PETTA Date of Exam: 07/30/2019 Medical Rec #:  [id number]  Height:       64.0 in Accession #:    [id number] Weight:       119.0 lb Date of Birth:  Jun 18, 1934   BSA:           1.569 m Patient Age:    21 years   BP:           116/74 mmHg Patient Gender: M          HR:           68 bpm. Exam Location:  Inpatient Procedure: Limited Echo Indications:    786.09 dyspnea  History:        Patient has no prior history of Echocardiogram examinations.                 Risk Factors:Hypertension. Hx of alcohol dependence. Covid +.  Sonographer:    Jannett Celestine RDCS (AE) Referring Phys: [id number] Lequita Halt  Sonographer Comments: No parasternal window. patient in a constricted position (very limited mobility). IMPRESSIONS  1. Technically difficult study with limited views  2. Left ventricular ejection fraction, by estimation, is 65 to 70%. The left ventricle has normal function.  3. Right ventricle is not well visualized  4. The mitral valve is normal in structure. No evidence of mitral valve regurgitation.  5. The aortic valve was not well visualized. Aortic valve regurgitation is not visualized. No aortic stenosis is present.  6. The inferior vena cava is normal in size with greater than 50% respiratory variability, suggesting right atrial pressure of 3 mmHg. FINDINGS  Left Ventricle: Left ventricular ejection fraction, by estimation, is 65 to 70%. The left ventricle has normal function. Right Ventricle: The right ventricular size is not well visualized. Right ventricular systolic function was not well visualized. Pericardium: Trivial pericardial effusion is present. Mitral Valve: The mitral valve is normal in structure. Tricuspid Valve: The tricuspid valve is not well visualized. Aortic Valve: The aortic valve was not well visualized. Aortic valve regurgitation is not visualized. No aortic stenosis is present. Pulmonic Valve: The pulmonic valve was not well visualized. Venous: The inferior vena cava is normal in size with greater than 50% respiratory variability, suggesting right atrial pressure of 3 mmHg. Oswaldo Milian MD Electronically signed by Oswaldo Milian MD Signature Date/Time:  07/30/2019/8:17:32 PM    Final     Micro Results    Recent Results (from the past 240 hour(s))  Urine culture     Status: Abnormal   Collection Time: 07/29/19  6:09 AM   Specimen: Urine, Random  Result Value Ref Range Status   Specimen Description URINE, RANDOM  Final   Special Requests   Final    NONE Performed at [facility], [address] [place], [place] [address]    Culture MULTIPLE SPECIES PRESENT, SUGGEST RECOLLECTION (A)  Final   Report Status 07/30/2019 FINAL  Final  Respiratory Panel by RT PCR (Flu A&B, Covid) - Nasopharyngeal Swab     Status: Abnormal   Collection Time: 07/29/19  4:30 PM   Specimen: Nasopharyngeal Swab  Result Value Ref Range Status   SARS Coronavirus 2 by RT PCR POSITIVE (A) NEGATIVE Final    Comment: RESULT CALLED TO, READ BACK BY AND VERIFIED WITHRhona Leavens RN [id number] 07/29/19 A BROWNING (NOTE) SARS-CoV-2 target nucleic acids are DETECTED. SARS-CoV-2 RNA is generally detectable in upper respiratory specimens  during the acute phase of infection. Positive results are indicative of the presence of the identified virus, but do not rule out bacterial infection or co-infection with other pathogens not detected by the test. Clinical correlation with patient history and other diagnostic information is necessary to determine patient infection status. The expected result is Negative. Fact Sheet for Patients:  PinkCheek.be Fact Sheet for Healthcare Providers: GravelBags.it This test is not yet approved or cleared by the [place] FDA and  has been authorized for detection and/or diagnosis of SARS-CoV-2 by FDA under an Emergency Use Authorization (EUA).  This EUA will remain in effect (meaning this test can be used) for  the duration of  the COVID-19 declaration under Section 564(b)(1) of the Act, 21 U.S.C. section 360bbb-3(b)(1), unless the authorization is terminated or revoked sooner.     Influenza A by PCR NEGATIVE NEGATIVE Final   Influenza B by PCR NEGATIVE NEGATIVE Final    Comment: (NOTE) The Xpert Xpress SARS-CoV-2/FLU/RSV assay is intended as an aid in  the diagnosis of influenza from Nasopharyngeal swab specimens and  should not be used as a sole basis for treatment. Nasal washings and  aspirates are unacceptable for Xpert Xpress SARS-CoV-2/FLU/RSV  testing. Fact Sheet for Patients: PinkCheek.be Fact Sheet for Healthcare Providers: GravelBags.it This test is not yet approved or cleared by the [place] FDA and  has been authorized for detection and/or diagnosis of SARS-CoV-2 by  FDA under an Emergency Use Authorization (EUA). This EUA will remain  in effect (meaning this test can be used) for the duration of the  Covid-19 declaration under Section 564(b)(1) of the Act, 21  U.S.C. section 360bbb-3(b)(1), unless the authorization is  terminated or revoked. Performed at [facility], [place] [address] [place], [place] [address]   MRSA PCR Screening     Status: None   Collection Time: 08/02/19  5:39 AM   Specimen: Nasal Mucosa; Nasopharyngeal  Result Value Ref Range Status   MRSA by PCR NEGATIVE NEGATIVE Final    Comment:        The GeneXpert MRSA Assay (FDA approved for NASAL specimens only), is one component of a comprehensive MRSA colonization surveillance program. It is not intended to diagnose MRSA infection nor to guide or monitor treatment for MRSA infections. Performed at [facility], [place] [address], [place], [place] [address]     Today   Subjective    [name] today in bed, minimally verbal due to baseline advanced dementia but denies any headache or chest pain, says he is not hungry and does not want to eat.   Objective   Blood pressure 136/90, pulse 100, temperature 98.2 F (36.8 C), temperature source Oral, resp. rate 20, height '5\' 4"'  (1.626 m), weight 54 kg,  SpO2 96 %.   Intake/Output Summary (Last 24 hours) at  08/05/2019 1001 Last data filed at 08/05/2019 2411 Gross per 24 hour  Intake 340 ml  Output 1275 ml  Net -935 ml    Exam  Extremely weak and frail elderly African-American male in hospital bed in no distress, he is awake more alert today, answers a few questions, denies any headache, says he is not hungry, moves all 4 extremities by himself [place].AT,PERRAL Supple Neck,No JVD, No cervical lymphadenopathy appriciated.  Symmetrical Chest wall movement, Good air movement bilaterally, CTAB RRR,No Gallops, Rubs or new Murmurs, No Parasternal Heave +ve B.Sounds, Abd Soft, No tenderness, No organomegaly appriciated, No rebound - guarding or rigidity. No Cyanosis, Clubbing or edema, No new Rash or bruise   Data Review   CBC w Diff:  Lab Results  Component Value Date   WBC 11.7 (H) 08/03/2019   HGB 11.8 (L) 08/03/2019   HGB 11.0 (L) 03/16/2015   HCT 37.5 (L) 08/03/2019   HCT 34.9 (L) 03/16/2015   PLT 193 08/03/2019   PLT 224 03/16/2015   LYMPHOPCT 21 08/03/2019   LYMPHOPCT 34.9 03/16/2015   MONOPCT 10 08/03/2019   MONOPCT 7.7 03/16/2015   EOSPCT 1 08/03/2019   EOSPCT 3.4 03/16/2015   BASOPCT 0 08/03/2019   BASOPCT 0.4 03/16/2015    CMP:  Lab Results  Component Value Date   NA 145 08/03/2019   NA 139 03/16/2015   K 3.9 08/03/2019   K 4.0 03/16/2015   CL 107 08/03/2019   CO2 25 08/03/2019   CO2 25 03/16/2015   BUN 27 (H) 08/03/2019   BUN 22.5 03/16/2015   CREATININE 1.23 08/03/2019   CREATININE 1.3 03/16/2015   PROT 5.4 (L) 08/03/2019   PROT 8.4 (H) 03/16/2015   ALBUMIN 2.0 (L) 08/03/2019   ALBUMIN 3.4 (L) 03/16/2015   BILITOT 1.4 (H) 08/03/2019   BILITOT 0.50 03/16/2015   ALKPHOS 51 08/03/2019   ALKPHOS 123 03/16/2015   AST 26 08/03/2019   AST 30 03/16/2015   ALT 20 08/03/2019   ALT 23 03/16/2015  .   Total Time in preparing paper work, data evaluation and todays exam - 79 minutes  Lala Lund M.D on  08/05/2019 at 10:01 AM  [facility]   Office  [phone]

## 2019-08-05 NOTE — Discharge Instructions (Signed)
Disposition.  Residential hospice °Condition.  Guarded °CODE STATUS.  DNR °Activity.  With assistance as tolerated, full fall precautions. °Diet.  Soft with feeding assistance and aspiration precautions. °Goal of care.  Comfort. ° °

## 2019-08-05 NOTE — TOC Transition Note (Signed)
Transition of Care [facility]) - CM/SW Discharge Note   Patient Details  Name: [name] MRN: [id number] Date of Birth: 06-26-1934  [facility] of [facility]) CM/SW Contact:  Benard Halsted, LCSW Phone Number: 08/05/2019, 10:57 AM   Clinical Narrative:    Patient will DC to: [facility] [place]) Anticipated DC date: 08/05/19 Family notified: Daughter, Production assistant, radio by: Corey Harold 11:30am   Per MD patient ready for DC to [facility]. RN, patient, patient's family, and facility notified of DC. Discharge Summary sent to facility. RN to call report prior to discharge [phone]). DC packet on chart. Ambulance transport requested for patient.   CSW will sign off for now as social work intervention is no longer needed. Please consult [place] again if new needs arise.  Cedric Fishman, LCSW Clinical Social Worker [phone]      Barriers to Discharge: [facility] when bed available)   Patient Goals and CMS Choice Patient states their goals for this hospitalization and ongoing recovery are:: Comfort CMS Medicare.gov Compare Post Acute Care list provided to:: Patient Represenative (must comment) Choice offered to / list presented to : Adult Children  Discharge Placement                       Discharge Plan and Services In-house Referral: Clinical Social Work   Post Acute Care Choice: [place]                               Social Determinants of Health (SDOH) Interventions     Readmission Risk Interventions Readmission Risk Prevention Plan 08/04/2019  Transportation Screening Complete  PCP or Specialist Appt within 3-5 Days Complete  HRI or [place] Complete  Social Work Consult for [place] Planning/Counseling Complete  Palliative Care Screening Complete  Medication Review Press photographer) Complete  Some recent data might be hidden

## 2019-08-05 NOTE — Progress Notes (Signed)
[name] to be DC to [facility]   per MD order.  Discussed with the patient and all questions fully answered.  VSS, Skin clean, dry and intact without evidence of skin break down, no evidence of skin tears noted. IV catheter discontinued intact. Site without signs and symptoms of complications. Dressing and pressure applied.  An After Visit Summary was printed and given to the patient. Patient received prescription.  D/c education completed with patient/family including follow up instructions, medication list, d/c activities limitations if indicated, with other d/c instructions as indicated by MD - patient able to verbalize understanding, all questions fully answered.   Patient instructed to return to ED, call 911, or call MD for any changes in condition.   Patient escorted via [facility], and D/CDC to [facility].    Lorenza Evangelist [facility] 08/05/2019 12:33 PM

## 2019-08-05 NOTE — Progress Notes (Signed)
[facility]: [place] to the daughter and she is in agreement with hospice services and comfort care approach.   The pt has been approved to go to inpatient facility by our MD.   Daughter will sign paperwork by email and once we have received this back we can arrange ambulance.   Thank you for this referral  Webb Silversmith RN

## 2019-08-05 NOTE — Progress Notes (Signed)
Report given to [employer] RN from [place], prior to D/C.

## 2019-08-05 NOTE — Care Management Important Message (Signed)
Important Message  Patient Details  Name: [name] MRN: [id number] Date of Birth: 24-Aug-1934   Medicare Important Message Given:  Yes - Important Message mailed due to current National Emergency  Verbal consent obtained due to current National Emergency  Relationship to patient: Child Contact Name: [name] Call Date: 08/05/19  Time: [id number] Phone: [phone] Outcome: Spoke with contact Important Message mailed to: Emergency contact on file    [name] 08/05/2019, 9:39 AM

## 2019-09-13 DEATH — deceased

## 2020-06-12 IMAGING — DX DG CHEST 1V PORT
1 series · 1 of 1 positions shown · non-contrast
Comparison: 07/29/2019

CLINICAL DATA: Shortness of breath, COVID symptoms

EXAM:
PORTABLE CHEST 1 VIEW

[chest ap]
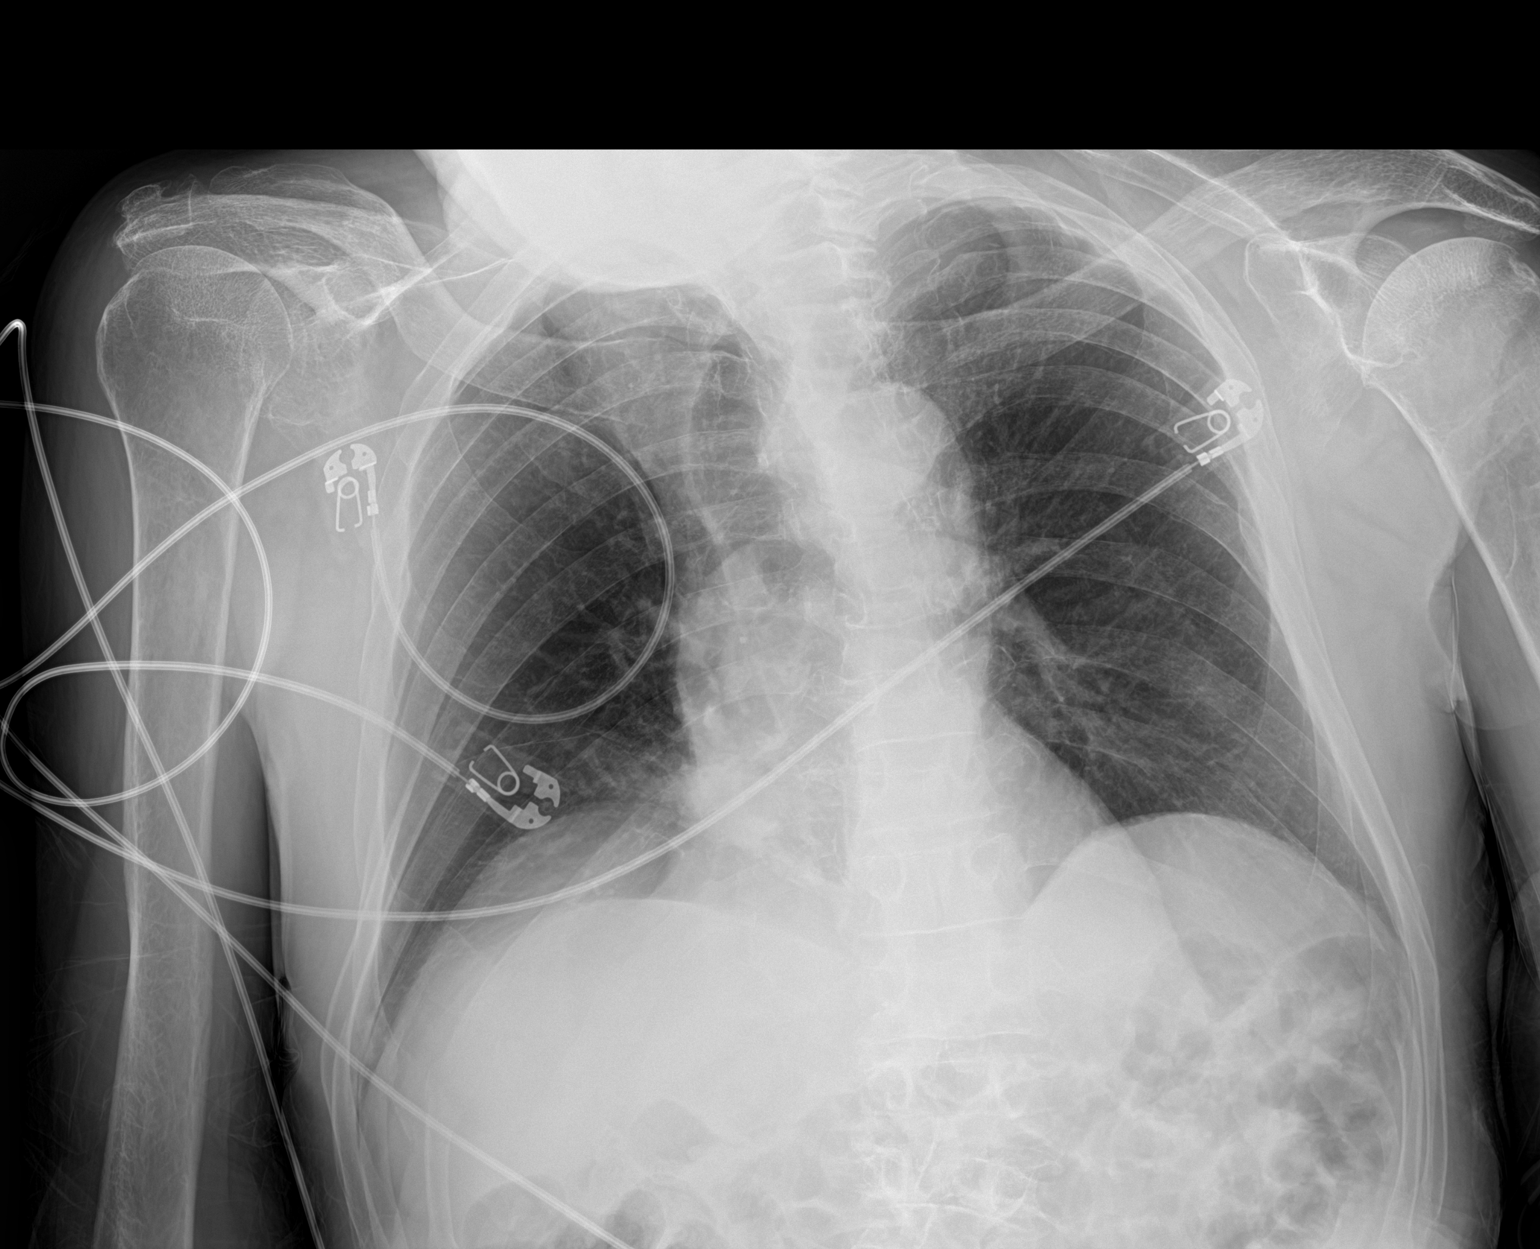

[1 of 1 positions shown; findings below may reference images not displayed]

FINDINGS: The heart size and mediastinal contours are within normal limits.
Both lungs are clear. Callused fracture deformities of the left
fifth and sixth ribs again noted. Acute fractures are not
appreciated.
IMPRESSION: No acute abnormality of the lungs in AP portable projection.
